# Patient Record
Sex: Male | Born: 1975 | Race: White | Hispanic: No | Marital: Married | Smoking: Former smoker
Health system: Southern US, Community
[De-identification: ages and names within clinical notes are randomized; demographics above are authoritative.]

## PROBLEM LIST (undated history)

## (undated) DIAGNOSIS — Z720 Tobacco use: Secondary | ICD-10-CM

## (undated) DIAGNOSIS — R319 Hematuria, unspecified: Secondary | ICD-10-CM

## (undated) DIAGNOSIS — K219 Gastro-esophageal reflux disease without esophagitis: Secondary | ICD-10-CM

## (undated) DIAGNOSIS — Z8619 Personal history of other infectious and parasitic diseases: Secondary | ICD-10-CM

## (undated) DIAGNOSIS — G4730 Sleep apnea, unspecified: Secondary | ICD-10-CM

## (undated) DIAGNOSIS — T8859XA Other complications of anesthesia, initial encounter: Secondary | ICD-10-CM

## (undated) DIAGNOSIS — Z87442 Personal history of urinary calculi: Secondary | ICD-10-CM

## (undated) DIAGNOSIS — Z8719 Personal history of other diseases of the digestive system: Secondary | ICD-10-CM

## (undated) DIAGNOSIS — N321 Vesicointestinal fistula: Secondary | ICD-10-CM

## (undated) DIAGNOSIS — K5792 Diverticulitis of intestine, part unspecified, without perforation or abscess without bleeding: Secondary | ICD-10-CM

## (undated) DIAGNOSIS — F410 Panic disorder [episodic paroxysmal anxiety] without agoraphobia: Secondary | ICD-10-CM

## (undated) DIAGNOSIS — E669 Obesity, unspecified: Secondary | ICD-10-CM

## (undated) DIAGNOSIS — J45909 Unspecified asthma, uncomplicated: Secondary | ICD-10-CM

## (undated) DIAGNOSIS — G4733 Obstructive sleep apnea (adult) (pediatric): Secondary | ICD-10-CM

## (undated) HISTORY — DX: Panic disorder (episodic paroxysmal anxiety): F41.0

## (undated) HISTORY — PX: BACK SURGERY: SHX140

## (undated) HISTORY — PX: WISDOM TOOTH EXTRACTION: SHX21

## (undated) HISTORY — DX: Personal history of other infectious and parasitic diseases: Z86.19

---

## 2005-11-07 ENCOUNTER — Emergency Department: Payer: Self-pay | Admitting: Emergency Medicine

## 2006-02-16 DIAGNOSIS — J45909 Unspecified asthma, uncomplicated: Secondary | ICD-10-CM | POA: Insufficient documentation

## 2006-02-22 ENCOUNTER — Emergency Department: Payer: Self-pay | Admitting: Emergency Medicine

## 2006-03-17 DIAGNOSIS — Z720 Tobacco use: Secondary | ICD-10-CM | POA: Insufficient documentation

## 2008-03-01 DIAGNOSIS — G44209 Tension-type headache, unspecified, not intractable: Secondary | ICD-10-CM | POA: Insufficient documentation

## 2008-06-13 ENCOUNTER — Ambulatory Visit: Payer: Self-pay | Admitting: Internal Medicine

## 2008-07-24 ENCOUNTER — Ambulatory Visit: Payer: Self-pay | Admitting: Family Medicine

## 2008-07-29 DIAGNOSIS — M940 Chondrocostal junction syndrome [Tietze]: Secondary | ICD-10-CM | POA: Insufficient documentation

## 2008-08-15 ENCOUNTER — Ambulatory Visit: Payer: Self-pay | Admitting: Family Medicine

## 2008-11-13 ENCOUNTER — Ambulatory Visit: Payer: Self-pay | Admitting: Family Medicine

## 2008-11-24 IMAGING — CR DG CHEST 2V
1 series · 2 of 2 positions shown · non-contrast
Comparison: none

REASON FOR EXAM: chest/sternum pain
COMMENTS:

[Series 1: view not recorded · 0.17mm/px · 2 of 2 slices shown]
[im 1/2]
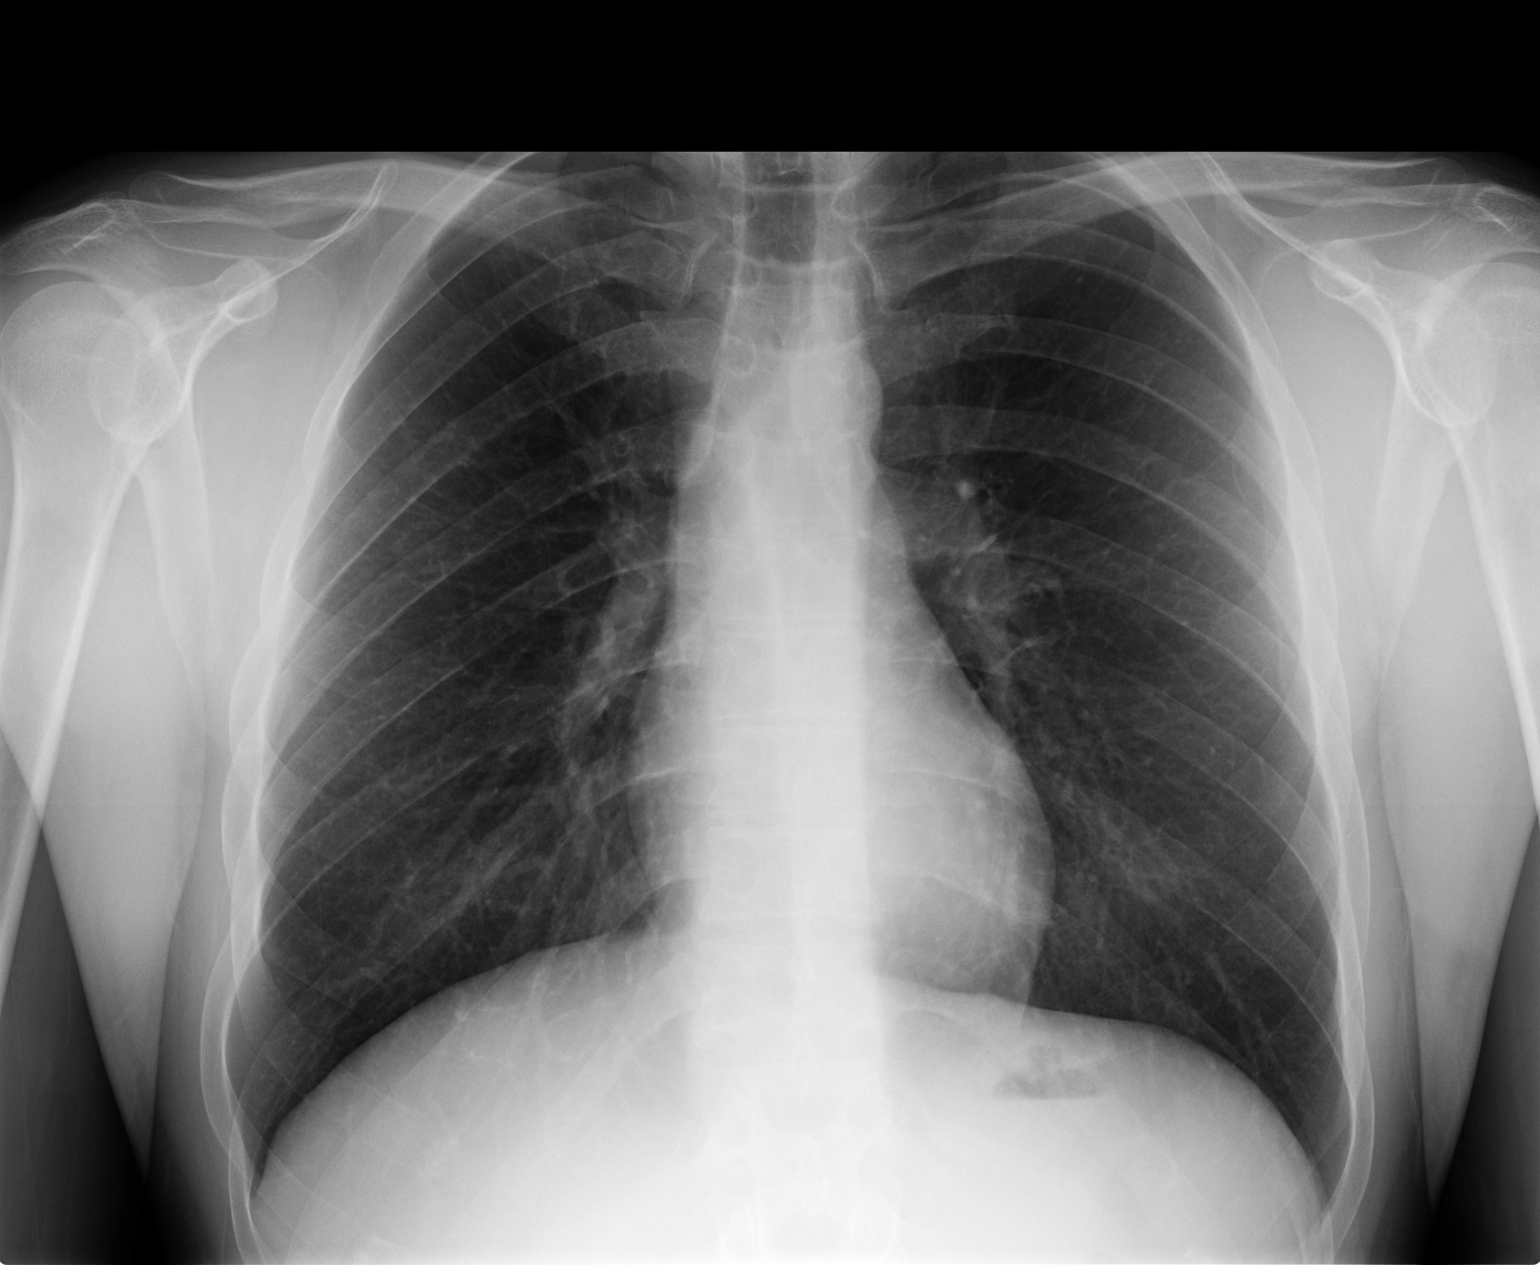
[im 2/2]
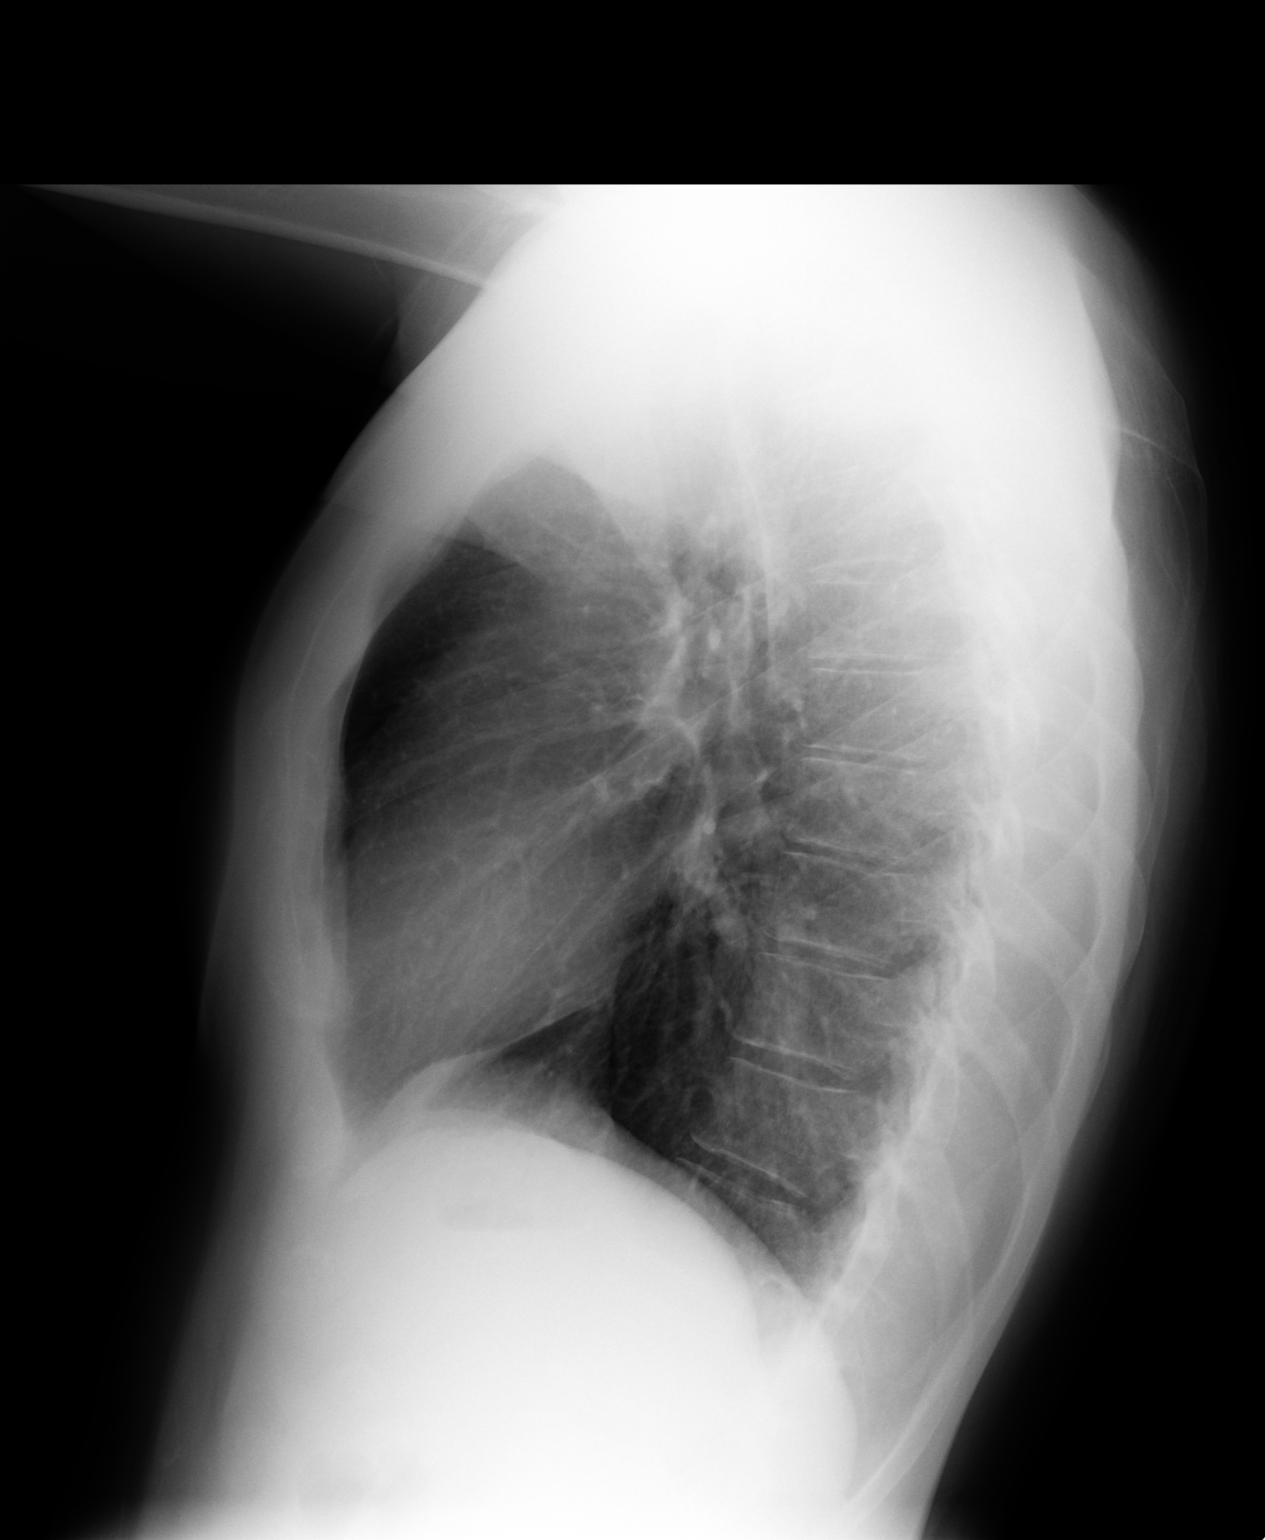

[2 of 2 positions shown; findings below may reference images not displayed]

PROCEDURE:     KDR - KDXR CHEST PA (OR AP) AND LAT  - July 24, 2008 [DATE]

RESULT:     Comparison is made to previous examination of 11/07/2005.

The lungs are clear. The heart and pulmonary vessels are normal. The bony
and mediastinal structures are unremarkable. There is no effusion. There is
no pneumothorax or evidence of congestive failure.
IMPRESSION: No acute cardiopulmonary disease.

## 2010-10-28 ENCOUNTER — Ambulatory Visit: Payer: Self-pay | Admitting: Internal Medicine

## 2011-10-05 ENCOUNTER — Emergency Department: Payer: Self-pay | Admitting: Emergency Medicine

## 2013-01-01 ENCOUNTER — Emergency Department: Payer: Self-pay | Admitting: Unknown Physician Specialty

## 2013-01-01 LAB — BASIC METABOLIC PANEL
Anion Gap: 5 — ABNORMAL LOW (ref 7–16)
BUN: 15 mg/dL (ref 7–18)
EGFR (African American): >60
EGFR (Non-African Amer.): >60
Glucose: 95 mg/dL (ref 65–99)
Osmolality: 282 (ref 275–301)
Sodium: 141 mmol/L (ref 136–145)

## 2013-01-01 LAB — CBC
HCT: 44.8 % (ref 40.0–52.0)
HGB: 15 g/dL (ref 13.0–18.0)
MCH: 28.2 pg (ref 26.0–34.0)
MCHC: 33.4 g/dL (ref 32.0–36.0)
MCV: 84 fL (ref 80–100)
WBC: 9.3 10*3/uL (ref 3.8–10.6)

## 2013-01-01 LAB — TROPONIN I
Troponin-I: <0.02 ng/mL
Troponin-I: <0.02 ng/mL

## 2013-06-21 ENCOUNTER — Encounter: Payer: Self-pay | Admitting: Family Medicine

## 2013-06-21 LAB — HM COLONOSCOPY: HM Colonoscopy: NORMAL

## 2013-06-27 ENCOUNTER — Ambulatory Visit: Payer: Self-pay | Admitting: Unknown Physician Specialty

## 2013-12-12 LAB — BASIC METABOLIC PANEL
BUN: 15 mg/dL (ref 4–21)
Creatinine: 1 mg/dL (ref <=1.3)
GLUCOSE: 77 mg/dL
Potassium: 4.2 mmol/L (ref 3.4–5.3)
SODIUM: 140 mmol/L (ref 137–147)

## 2013-12-12 LAB — CBC AND DIFFERENTIAL
HEMATOCRIT: 46 % (ref 41–53)
Hemoglobin: 15.9 g/dL (ref 13.5–17.5)
Platelets: 253 10*3/uL (ref 150–399)
WBC: 9.7 10^3/mL

## 2013-12-12 LAB — TSH: TSH: 1.74 u[IU]/mL (ref <=5.90)

## 2015-01-22 ENCOUNTER — Emergency Department: Admit: 2015-01-22 | Disposition: A | Discharge status: Home / Self Care | Payer: Self-pay | Admitting: Student

## 2015-01-22 LAB — TROPONIN I

## 2015-01-22 LAB — COMPREHENSIVE METABOLIC PANEL WITH GFR
Albumin: 4.7 g/dL
Alkaline Phosphatase: 84 U/L
Anion Gap: 9
BUN: 16 mg/dL
Bilirubin,Total: 0.4 mg/dL
Calcium, Total: 9.9 mg/dL
Chloride: 103 mmol/L
Co2: 25 mmol/L
Creatinine: 1.02 mg/dL
EGFR (African American): >60
EGFR (Non-African Amer.): >60
Glucose: 103 mg/dL — ABNORMAL HIGH
Potassium: 3.6 mmol/L
SGOT(AST): 20 U/L
SGPT (ALT): 17 U/L
Sodium: 137 mmol/L
Total Protein: 7.5 g/dL

## 2015-01-22 LAB — CBC
HCT: 44.1 %
HGB: 15.2 g/dL
MCH: 28.7 pg
MCHC: 34.5 g/dL
MCV: 83 fL
Platelet: 261 x10 3/mm 3
RBC: 5.3 x10 6/mm 3
RDW: 14.1 %
WBC: 9.6 x10 3/mm 3

## 2015-01-22 LAB — CK TOTAL AND CKMB (NOT AT ARMC)
CK, Total: 97 U/L
CK-MB: 1.4 ng/mL

## 2015-01-23 LAB — LIPID PANEL
Cholesterol: 185 mg/dL (ref 0–200)
HDL: 50 mg/dL (ref 35–70)
LDL CALC: 117 mg/dL
TRIGLYCERIDES: 92 mg/dL (ref 40–160)

## 2015-10-19 ENCOUNTER — Encounter: Payer: Self-pay | Admitting: Emergency Medicine

## 2015-10-19 ENCOUNTER — Emergency Department
Admission: EM | Admit: 2015-10-19 | Discharge: 2015-10-19 | Disposition: A | Discharge status: Home / Self Care | Payer: BLUE CROSS/BLUE SHIELD | Attending: Emergency Medicine | Admitting: Emergency Medicine

## 2015-10-19 ENCOUNTER — Emergency Department: Payer: BLUE CROSS/BLUE SHIELD

## 2015-10-19 DIAGNOSIS — R0789 Other chest pain: Secondary | ICD-10-CM | POA: Insufficient documentation

## 2015-10-19 DIAGNOSIS — Z87891 Personal history of nicotine dependence: Secondary | ICD-10-CM | POA: Insufficient documentation

## 2015-10-19 DIAGNOSIS — J019 Acute sinusitis, unspecified: Secondary | ICD-10-CM | POA: Diagnosis not present

## 2015-10-19 DIAGNOSIS — R51 Headache: Secondary | ICD-10-CM | POA: Insufficient documentation

## 2015-10-19 DIAGNOSIS — R079 Chest pain, unspecified: Secondary | ICD-10-CM | POA: Diagnosis present

## 2015-10-19 DIAGNOSIS — Z880 Allergy status to penicillin: Secondary | ICD-10-CM | POA: Diagnosis not present

## 2015-10-19 HISTORY — DX: Gastro-esophageal reflux disease without esophagitis: K21.9

## 2015-10-19 LAB — COMPREHENSIVE METABOLIC PANEL
ALT: 17 U/L (ref 17–63)
AST: 14 U/L — AB (ref 15–41)
Albumin: 3.8 g/dL (ref 3.5–5.0)
Alkaline Phosphatase: 81 U/L (ref 38–126)
Anion gap: 6 (ref 5–15)
BILIRUBIN TOTAL: 0.6 mg/dL (ref 0.3–1.2)
BUN: 20 mg/dL (ref 6–20)
CO2: 26 mmol/L (ref 22–32)
Calcium: 8.9 mg/dL (ref 8.9–10.3)
Chloride: 106 mmol/L (ref 101–111)
Creatinine, Ser: 0.94 mg/dL (ref 0.61–1.24)
GFR calc Af Amer: >60 mL/min (ref >60)
Glucose, Bld: 90 mg/dL (ref 65–99)
POTASSIUM: 3.8 mmol/L (ref 3.5–5.1)
Sodium: 138 mmol/L (ref 135–145)
Total Protein: 7.2 g/dL (ref 6.5–8.1)

## 2015-10-19 LAB — CBC
HEMATOCRIT: 45.6 % (ref 40.0–52.0)
Hemoglobin: 15.6 g/dL (ref 13.0–18.0)
MCH: 27.7 pg (ref 26.0–34.0)
MCHC: 34.1 g/dL (ref 32.0–36.0)
MCV: 81.4 fL (ref 80.0–100.0)
Platelets: 398 10*3/uL (ref 150–440)
RBC: 5.61 MIL/uL (ref 4.40–5.90)
RDW: 13.4 % (ref 11.5–14.5)
WBC: 12 10*3/uL — AB (ref 3.8–10.6)

## 2015-10-19 LAB — TROPONIN I

## 2015-10-19 MED ORDER — PANTOPRAZOLE SODIUM 20 MG PO TBEC: 20.0000 mg | DELAYED_RELEASE_TABLET | Freq: Every day | ORAL | Status: DC

## 2015-10-19 MED ORDER — KETOROLAC TROMETHAMINE 30 MG/ML IJ SOLN
INTRAMUSCULAR | Status: AC
  Administered 2015-10-19: 30 mg via INTRAVENOUS
  Filled 2015-10-19: qty 1

## 2015-10-19 MED ORDER — KETOROLAC TROMETHAMINE 30 MG/ML IJ SOLN
30.0000 mg | Freq: Once | INTRAMUSCULAR | Status: AC
  Administered 2015-10-19: 30 mg via INTRAVENOUS

## 2015-10-19 MED ORDER — GI COCKTAIL ~~LOC~~
30.0000 mL | Freq: Once | ORAL | Status: AC
  Administered 2015-10-19: 30 mL via ORAL
  Filled 2015-10-19: qty 30

## 2015-10-19 NOTE — ED Notes (Addendum)
Patient states that he started having chest pain last night. This morning pain has not improved and is radiating down his left arm. Patient states that he had some shortness of breath last PM when going up the steps but denies any other symptoms. Patient has a hx/o reflux and also anxiety.  Patient also states that he is having an achy pain in his fingers on his left hand and a headache

## 2015-10-19 NOTE — ED Notes (Signed)
Intermittent chest pain since 9 pm last night, states began while resting on couch watching TV.

## 2015-10-19 NOTE — Discharge Instructions (Signed)
Nonspecific Chest Pain  °Chest pain can be caused by many different conditions. There is always a chance that your pain could be related to something serious, such as a heart attack or a blood clot in your lungs. Chest pain can also be caused by conditions that are not life-threatening. If you have chest pain, it is very important to follow up with your health care provider. °CAUSES  °Chest pain can be caused by: °· Heartburn. °· Pneumonia or bronchitis. °· Anxiety or stress. °· Inflammation around your heart (pericarditis) or lung (pleuritis or pleurisy). °· A blood clot in your lung. °· A collapsed lung (pneumothorax). It can develop suddenly on its own (spontaneous pneumothorax) or from trauma to the chest. °· Shingles infection (varicella-zoster virus). °· Heart attack. °· Damage to the bones, muscles, and cartilage that make up your chest wall. This can include: °¨ Bruised bones due to injury. °¨ Strained muscles or cartilage due to frequent or repeated coughing or overwork. °¨ Fracture to one or more ribs. °¨ Sore cartilage due to inflammation (costochondritis). °RISK FACTORS  °Risk factors for chest pain may include: °· Activities that increase your risk for trauma or injury to your chest. °· Respiratory infections or conditions that cause frequent coughing. °· Medical conditions or overeating that can cause heartburn. °· Heart disease or family history of heart disease. °· Conditions or health behaviors that increase your risk of developing a blood clot. °· Having had chicken pox (varicella zoster). °SIGNS AND SYMPTOMS °Chest pain can feel like: °· Burning or tingling on the surface of your chest or deep in your chest. °· Crushing, pressure, aching, or squeezing pain. °· Dull or sharp pain that is worse when you move, cough, or take a deep breath. °· Pain that is also felt in your back, neck, shoulder, or arm, or pain that spreads to any of these areas. °Your chest pain may come and go, or it may stay  constant. °DIAGNOSIS °Lab tests or other studies may be needed to find the cause of your pain. Your health care provider may have you take a test called an ambulatory ECG (electrocardiogram). An ECG records your heartbeat patterns at the time the test is performed. You may also have other tests, such as: °· Transthoracic echocardiogram (TTE). During echocardiography, sound waves are used to create a picture of all of the heart structures and to look at how blood flows through your heart. °· Transesophageal echocardiogram (TEE). This is a more advanced imaging test that obtains images from inside your body. It allows your health care provider to see your heart in finer detail. °· Cardiac monitoring. This allows your health care provider to monitor your heart rate and rhythm in real time. °· Holter monitor. This is a portable device that records your heartbeat and can help to diagnose abnormal heartbeats. It allows your health care provider to track your heart activity for several days, if needed. °· Stress tests. These can be done through exercise or by taking medicine that makes your heart beat more quickly. °· Blood tests. °· Imaging tests. °TREATMENT  °Your treatment depends on what is causing your chest pain. Treatment may include: °· Medicines. These may include: °¨ Acid blockers for heartburn. °¨ Anti-inflammatory medicine. °¨ Pain medicine for inflammatory conditions. °¨ Antibiotic medicine, if an infection is present. °¨ Medicines to dissolve blood clots. °¨ Medicines to treat coronary artery disease. °· Supportive care for conditions that do not require medicines. This may include: °¨ Resting. °¨ Applying heat   or cold packs to injured areas.  Limiting activities until pain decreases. HOME CARE INSTRUCTIONS  If you were prescribed an antibiotic medicine, finish it all even if you start to feel better.  Avoid any activities that bring on chest pain.  Do not use any tobacco products, including  cigarettes, chewing tobacco, or electronic cigarettes. If you need help quitting, ask your health care provider.  Do not drink alcohol.  Take medicines only as directed by your health care provider.  Keep all follow-up visits as directed by your health care provider. This is important. This includes any further testing if your chest pain does not go away.  If heartburn is the cause for your chest pain, you may be told to keep your head raised (elevated) while sleeping. This reduces the chance that acid will go from your stomach into your esophagus.  Make lifestyle changes as directed by your health care provider. These may include:  Getting regular exercise. Ask your health care provider to suggest some activities that are safe for you.  Eating a heart-healthy diet. A registered dietitian can help you to learn healthy eating options.  Maintaining a healthy weight.  Managing diabetes, if necessary.  Reducing stress. SEEK MEDICAL CARE IF:  Your chest pain does not go away after treatment.  You have a rash with blisters on your chest.  You have a fever. SEEK IMMEDIATE MEDICAL CARE IF:   Your chest pain is worse.  You have an increasing cough, or you cough up blood.  You have severe abdominal pain.  You have severe weakness.  You faint.  You have chills.  You have sudden, unexplained chest discomfort.  You have sudden, unexplained discomfort in your arms, back, neck, or jaw.  You have shortness of breath at any time.  You suddenly start to sweat, or your skin gets clammy.  You feel nauseous or you vomit.  You suddenly feel light-headed or dizzy.  Your heart begins to beat quickly, or it feels like it is skipping beats. These symptoms may represent a serious problem that is an emergency. Do not wait to see if the symptoms will go away. Get medical help right away. Call your local emergency services (911 in the [place] Do not drive yourself to the hospital.   This  information is not intended to replace advice given to you by your health care provider. Make sure you discuss any questions you have with your health care provider.   Document Released: 07/15/2005 Document Revised: 10/26/2014 Document Reviewed: 05/11/2014 Elsevier Interactive Patient Education [employer]2016 Elsevier Inc.  Please return immediately if condition worsens. Please contact her primary physician or the physician you were given for referral. If you have any specialist physicians involved in her treatment and plan please also contact them. Thank you for using [facility]. He can take over-the-counter Prilosec or Nexium. Avoid nonsteroidal products such as ibuprofen and Aleve. Tylenol for pain

## 2015-10-19 NOTE — ED Provider Notes (Signed)
Time Seen: Approximately *10 AM  I have reviewed the triage notes  Chief Complaint: Chest Pain   History of Present Illness: Derek Ramirez is a 39 y.o. male who presents with left-sided chest discomfort which he noticed last night at 9 PM. He describes it as a burning sensation left sternal border with some paresthesias and possible radiation into the left arm. He states he went to bed and noticed it again this morning he is not sure if it lasted all night. He denies any nausea, vomiting, diaphoresis, shortness of breath. He denies any pulmonary emboli risk factors. He denies any significant family history, hypertension, diabetes or high cholesterol. He also states he's had a mild left-sided headache and points mainly to the frontal and left frontal region without any sinus discharge, fever, productive cough or wheezing. He denies any neck pain, photophobia focal weakness.*   Past Medical History  Diagnosis Date  . Anxiety   . GERD (gastroesophageal reflux disease)   . IBS (irritable bowel syndrome)     There are no active problems to display for this patient.   History reviewed. No pertinent past surgical history.  History reviewed. No pertinent past surgical history.  Current Outpatient Rx  Name  Route  Sig  Dispense  Refill  . pantoprazole (PROTONIX) 20 MG tablet   Oral   Take 1 tablet (20 mg total) by mouth daily.   30 tablet   1     Allergies:  Penicillins  Family History: No family history on file.  Social History: Social History  Substance Use Topics  . Smoking status: Former Games developer  . Smokeless tobacco: None  . Alcohol Use: No     Review of Systems:   10 point review of systems was performed and was otherwise negative:  Constitutional: No fever Eyes: No visual disturbances ENT: No sore throat, ear pain Cardiac: Chest pain is relatively persistent since early this morning and/or last night. No radiation to the jaw or back region Respiratory: No  shortness of breath, wheezing, or stridor Abdomen: No abdominal pain, no vomiting, No diarrhea Endocrine: No weight loss, No night sweats Extremities: No peripheral edema, cyanosis Skin: No rashes, easy bruising Neurologic: No focal weakness, trouble with speech or swollowing Urologic: No dysuria, Hematuria, or urinary frequency   Physical Exam:  ED Triage Vitals  Enc Vitals Group     BP 10/19/15 0933 123/84 mmHg     Pulse Rate 10/19/15 0933 79     Resp 10/19/15 0933 20     Temp 10/19/15 0933 98.5 F (36.9 C)     Temp Source 10/19/15 0933 Oral     SpO2 10/19/15 0933 99 %     Weight 10/19/15 0933 210 lb (95.255 kg)     Height 10/19/15 0933  (1.803 m)     Head Cir --      Peak Flow --      Pain Score 10/19/15 0934 4     Pain Loc --      Pain Edu? --      Excl. in GC? --     General: Awake , Alert , and Oriented times 3; GCS 15 Head: Normal cephalic , atraumatic Eyes: Pupils equal , round, reactive to light Nose/Throat: No nasal drainage, patent upper airway without erythema or exudate.  Neck: Supple, Full range of motion, No anterior adenopathy or palpable thyroid masses Lungs: Clear to ascultation without wheezes , rhonchi, or rales Heart: Regular rate, regular rhythm without murmurs ,  gallops , or rubs Abdomen: Soft, non tender without rebound, guarding , or rigidity; bowel sounds positive and symmetric in all 4 quadrants. No organomegaly .        Extremities: 2 plus symmetric pulses. No edema, clubbing or cyanosis Neurologic: normal ambulation, Motor symmetric without deficits, sensory intact Skin: warm, dry, no rashes   Labs:   All laboratory work was reviewed including any pertinent negatives or positives listed below:  Labs Reviewed  CBC - Abnormal; Notable for the following:    WBC 12.0 (*)    All other components within normal limits  COMPREHENSIVE METABOLIC PANEL - Abnormal; Notable for the following:    AST 14 (*)    All other components within normal  limits  TROPONIN I   review of laboratory work shows no significant abnormalities  EKG:  ED ECG REPORT I, Jennye MoccasinBrian S Samari Bittinger, the attending physician, personally viewed and interpreted this ECG.  Date: 10/19/2015 EKG Time: 0930 Rate: 73 Rhythm: normal sinus rhythm QRS Axis: normal Intervals: normal ST/T Wave abnormalities: normal Conduction Disutrbances: none Narrative Interpretation: unremarkable Normal EKG EXAM: CT HEAD WITHOUT CONTRAST  TECHNIQUE: Contiguous axial images were obtained from the base of the skull through the vertex without intravenous contrast.  COMPARISON: None.  FINDINGS: There is no evidence for acute hemorrhage, hydrocephalus, mass lesion, or abnormal extra-axial fluid collection. No definite CT evidence for acute infarction. Near complete opacification of the maxillary sinuses and sphenoid sinuses evident with air-fluid levels visible in the frontal and sphenoid sinuses. There is prominent mucosal disease in the ethmoid air cells. Mastoid air cells are clear.  IMPRESSION: 1. No acute intracranial abnormality. 2. Prominent acute on chronic paranasal sinusitis.   Electronically Signed By: Kennith CenterEric Mansell M.D. On: 10/19/2015 10:33          DG Chest 2 View (Final result) Result time: 10/19/15 10:30:48   Final result by Rad Results In Interface (10/19/15 10:30:48)   Narrative:   CLINICAL DATA: Chest the last night  EXAM: CHEST 2 VIEW  COMPARISON: 01/22/2015  FINDINGS: Normal heart size. Clear lungs. No pneumothorax or pleural effusion.  IMPRESSION: No active cardiopulmonary disease.       Radiology:       I personally reviewed the radiologic studies     ED Course: * Patient's stay here was uneventful and felt symptomatically improved with a GI cocktail. He has a history of esophageal reflux disease and is currently not on any anti-acid medication. Patient will be prescribed proton next on an outpatient basis and  I felt this was unlikely to be a life-threatening cause for his chest pain. Patient's headache seems to be a new issue for him he states he's never had a chronic headache status not state that this is a severe discomfort that he is having. Given a negative head CT I felt he did not require lumbar puncture evaluation and him that he may need further outpatient testing. Patient does not exhibit any fever and I did not feel the sinusitis was acute bacterial at this point. He has been advised to follow-up with his primary physician.**    Assessment: Acute chest pain likely esophageal reflux disease Acute cephalgia with acute on chronic sinusitis  Final Clinical Impression:   Final diagnoses:  Chest pain syndrome     Plan: Outpatient management Patient was advised to return immediately if condition worsens. Patient was advised to follow up with their primary care physician or other specialized physicians involved in their outpatient care  Jennye Moccasin, MD 10/19/15 [phone]

## 2015-10-30 ENCOUNTER — Telehealth: Payer: Self-pay | Admitting: Family Medicine

## 2015-10-30 NOTE — Telephone Encounter (Signed)
[facility] for stomach pain.  I have scheduled a hospital follow up/MW

## 2015-10-31 DIAGNOSIS — R079 Chest pain, unspecified: Secondary | ICD-10-CM | POA: Insufficient documentation

## 2015-10-31 DIAGNOSIS — R12 Heartburn: Secondary | ICD-10-CM | POA: Insufficient documentation

## 2015-10-31 DIAGNOSIS — J309 Allergic rhinitis, unspecified: Secondary | ICD-10-CM | POA: Insufficient documentation

## 2015-10-31 DIAGNOSIS — F410 Panic disorder [episodic paroxysmal anxiety] without agoraphobia: Secondary | ICD-10-CM | POA: Insufficient documentation

## 2015-10-31 DIAGNOSIS — F419 Anxiety disorder, unspecified: Secondary | ICD-10-CM | POA: Insufficient documentation

## 2015-10-31 DIAGNOSIS — R002 Palpitations: Secondary | ICD-10-CM | POA: Insufficient documentation

## 2015-10-31 DIAGNOSIS — K219 Gastro-esophageal reflux disease without esophagitis: Secondary | ICD-10-CM | POA: Insufficient documentation

## 2015-11-01 ENCOUNTER — Encounter: Payer: Self-pay | Admitting: Family Medicine

## 2015-11-01 ENCOUNTER — Ambulatory Visit (INDEPENDENT_AMBULATORY_CARE_PROVIDER_SITE_OTHER): Payer: BLUE CROSS/BLUE SHIELD | Admitting: Family Medicine

## 2015-11-01 VITALS — BP 104/72 | HR 72 | Temp 99.2°F | Resp 12 | Wt 210.0 lb

## 2015-11-01 DIAGNOSIS — F419 Anxiety disorder, unspecified: Secondary | ICD-10-CM

## 2015-11-01 DIAGNOSIS — K219 Gastro-esophageal reflux disease without esophagitis: Secondary | ICD-10-CM | POA: Diagnosis not present

## 2015-11-01 MED ORDER — PANTOPRAZOLE SODIUM 20 MG PO TBEC: 20.0000 mg | DELAYED_RELEASE_TABLET | Freq: Every day | ORAL | Status: DC

## 2015-11-01 MED ORDER — SERTRALINE HCL 50 MG PO TABS: 25.0000 mg | ORAL_TABLET | Freq: Every day | ORAL | Status: DC

## 2015-11-01 MED ORDER — ALPRAZOLAM 0.25 MG PO TABS: 0.2500 mg | ORAL_TABLET | Freq: Four times a day (QID) | ORAL | Status: DC | PRN

## 2015-11-01 NOTE — Patient Instructions (Signed)
Gastroesophageal Reflux Disease, Adult Normally, food travels down the esophagus and stays in the stomach to be digested. However, when a person has gastroesophageal reflux disease (GERD), food and stomach acid move back up into the esophagus. When this happens, the esophagus becomes sore and inflamed. Over time, GERD can create small holes (ulcers) in the lining of the esophagus.  CAUSES This condition is caused by a problem with the muscle between the esophagus and the stomach (lower esophageal sphincter, or LES). Normally, the LES muscle closes after food passes through the esophagus to the stomach. When the LES is weakened or abnormal, it does not close properly, and that allows food and stomach acid to go back up into the esophagus. The LES can be weakened by certain dietary substances, medicines, and medical conditions, including:  Tobacco use.  Pregnancy.  Having a hiatal hernia.  Heavy alcohol use.  Certain foods and beverages, such as coffee, chocolate, onions, and peppermint. RISK FACTORS This condition is more likely to develop in:  People who have an increased body weight.  People who have connective tissue disorders.  People who use NSAID medicines. SYMPTOMS Symptoms of this condition include:  Heartburn.  Difficult or painful swallowing.  The feeling of having a lump in the throat.  Abitter taste in the mouth.  Bad breath.  Having a large amount of saliva.  Having an upset or bloated stomach.  Belching.  Chest pain.  Shortness of breath or wheezing.  Ongoing (chronic) cough or a night-time cough.  Wearing away of tooth enamel.  Weight loss. Different conditions can cause chest pain. Make sure to see your health care provider if you experience chest pain. DIAGNOSIS Your health care provider will take a medical history and perform a physical exam. To determine if you have mild or severe GERD, your health care provider may also monitor how you respond  to treatment. You may also have other tests, including:  An endoscopy toexamine your stomach and esophagus with a small camera.  A test thatmeasures the acidity level in your esophagus.  A test thatmeasures how much pressure is on your esophagus.  A barium swallow or modified barium swallow to show the shape, size, and functioning of your esophagus. TREATMENT The goal of treatment is to help relieve your symptoms and to prevent complications. Treatment for this condition may vary depending on how severe your symptoms are. Your health care provider may recommend:  Changes to your diet.  Medicine.  Surgery. HOME CARE INSTRUCTIONS Diet  Follow a diet as recommended by your health care provider. This may involve avoiding foods and drinks such as:  Coffee and tea (with or without caffeine).  Drinks that containalcohol.  Energy drinks and sports drinks.  Carbonated drinks or sodas.  Chocolate and cocoa.  Peppermint and mint flavorings.  Garlic and onions.  Horseradish.  Spicy and acidic foods, including peppers, chili powder, curry powder, vinegar, hot sauces, and barbecue sauce.  Citrus fruit juices and citrus fruits, such as oranges, lemons, and limes.  Tomato-based foods, such as red sauce, chili, salsa, and pizza with red sauce.  Fried and fatty foods, such as donuts, french fries, potato chips, and high-fat dressings.  High-fat meats, such as hot dogs and fatty cuts of red and white meats, such as rib eye steak, sausage, ham, and bacon.  High-fat dairy items, such as whole milk, butter, and cream cheese.  Eat small, frequent meals instead of large meals.  Avoid drinking large amounts of liquid with your   meals.  Avoid eating meals during the 2-3 hours before bedtime.  Avoid lying down right after you eat.  Do not exercise right after you eat. General Instructions  Pay attention to any changes in your symptoms.  Take over-the-counter and prescription  medicines only as told by your health care provider. Do not take aspirin, ibuprofen, or other NSAIDs unless your health care provider told you to do so.  Do not use any tobacco products, including cigarettes, chewing tobacco, and e-cigarettes. If you need help quitting, ask your health care provider.  Wear loose-fitting clothing. Do not wear anything tight around your waist that causes pressure on your abdomen.  Raise (elevate) the head of your bed 6 inches (15cm).  Try to reduce your stress, such as with yoga or meditation. If you need help reducing stress, ask your health care provider.  If you are overweight, reduce your weight to an amount that is healthy for you. Ask your health care provider for guidance about a safe weight loss goal.  Keep all follow-up visits as told by your health care provider. This is important. SEEK MEDICAL CARE IF:  You have new symptoms.  You have unexplained weight loss.  You have difficulty swallowing, or it hurts to swallow.  You have wheezing or a persistent cough.  Your symptoms do not improve with treatment.  You have a hoarse voice. SEEK IMMEDIATE MEDICAL CARE IF:  You have pain in your arms, neck, jaw, teeth, or back.  You feel sweaty, dizzy, or light-headed.  You have chest pain or shortness of breath.  You vomit and your vomit looks like blood or coffee grounds.  You faint.  Your stool is bloody or black.  You cannot swallow, drink, or eat.   This information is not intended to replace advice given to you by your health care provider. Make sure you discuss any questions you have with your health care provider.   Document Released: 07/15/2005 Document Revised: 06/26/2015 Document Reviewed: 01/30/2015 Elsevier Interactive Patient Education 2016 Elsevier Inc. Food Choices for Gastroesophageal Reflux Disease, Adult When you have gastroesophageal reflux disease (GERD), the foods you eat and your eating habits are very important.  Choosing the right foods can help ease the discomfort of GERD. WHAT GENERAL GUIDELINES DO I NEED TO FOLLOW?  Choose fruits, vegetables, whole grains, low-fat dairy products, and low-fat meat, fish, and poultry.  Limit fats such as oils, salad dressings, butter, nuts, and avocado.  Keep a food diary to identify foods that cause symptoms.  Avoid foods that cause reflux. These may be different for different people.  Eat frequent small meals instead of three large meals each day.  Eat your meals slowly, in a relaxed setting.  Limit fried foods.  Cook foods using methods other than frying.  Avoid drinking alcohol.  Avoid drinking large amounts of liquids with your meals.  Avoid bending over or lying down until 2-3 hours after eating. WHAT FOODS ARE NOT RECOMMENDED? The following are some foods and drinks that may worsen your symptoms: Vegetables Tomatoes. Tomato juice. Tomato and spaghetti sauce. Chili peppers. Onion and garlic. Horseradish. Fruits Oranges, grapefruit, and lemon (fruit and juice). Meats High-fat meats, fish, and poultry. This includes hot dogs, ribs, ham, sausage, salami, and bacon. Dairy Whole milk and chocolate milk. Sour cream. Cream. Butter. Ice cream. Cream cheese.  Beverages Coffee and tea, with or without caffeine. Carbonated beverages or energy drinks. Condiments Hot sauce. Barbecue sauce.  Sweets/Desserts Chocolate and cocoa. Donuts. Peppermint and spearmint.   Fats and Oils High-fat foods, including French fries and potato chips. Other Vinegar. Strong spices, such as black pepper, white pepper, red pepper, cayenne, curry powder, cloves, ginger, and chili powder. The items listed above may not be a complete list of foods and beverages to avoid. Contact your dietitian for more information.   This information is not intended to replace advice given to you by your health care provider. Make sure you discuss any questions you have with your health care  provider.   Document Released: 10/05/2005 Document Revised: 10/26/2014 Document Reviewed: 08/09/2013 Elsevier Interactive Patient Education 2016 [employer] Inc.  

## 2015-11-01 NOTE — Progress Notes (Signed)
Patient ID: Derek Ramirez, male   DOB: Jan 05, 1976, 40 y.o.   MRN: [id number]        Patient: Derek Ramirez Male    DOB: 07-25-1976   40 y.o.   MRN: [id number] Visit Date: 11/01/2015  Today's Provider: Mila Merry, MD   Chief Complaint  Patient presents with  . Hospitalization Follow-up    ER follow up  . Gastroesophageal Reflux  . Headache   Subjective:    HPI  Follow up ER visit  Patient was seen in ER for chest pain and headache on December 31st. He was treated for GERD-he was started on Protonix 20 mg 1 tablet daily-he was given GI cocktail due to chest pain he was having when he arrived to ER and symptoms quickly resolved For evaluation Headache-they did CT of the head which was normal, this symptom was diagnosed as chronic sinusitis. He had sinus infection a few weeks before which he had treated symptomatically with OTC medications and antibiotic that was prescribed by a 'Tele-Doc' He reports good compliance with treatment. He reports this condition is Improved. He states he still gets a pain in the left upper quadrant of his abdomen randomly, states this is not always related with eating or not. His headache has resolved except just had one last night but was able to treat this with Tylenol.   Had similar symptoms a few years ago and had cardiac evaluation by Dr. Darrold Junker, who referred him to [facility] GI. He was unable to completed EGD but had barium swallow showing reflux  He states he typically has heartburn once or twice a week. Has recently made some dietary changes.   No alcohol or cigarettes.    Follow up anxiety He states that sertraline worked very well and was well tolerated, but he ran out a few months ago and would like refill. He states he took occasional alprazolam, but is now out     Allergies  Allergen Reactions  . Penicillins Anaphylaxis    Has patient had a PCN reaction causing immediate rash, facial/tongue/throat swelling, SOB or lightheadedness with  hypotension: Yes Has patient had a PCN reaction causing severe rash involving mucus membranes or skin necrosis: No Has patient had a PCN reaction that required hospitalization No Patient not sure Has patient had a PCN reaction occurring within the last 10 years: No If all of the above answers are "NO", then may proceed with Cephalosporin use.  . Shellfish Allergy     Throat swells and cant breath   Previous Medications   ALPRAZOLAM (XANAX) 0.25 MG TABLET    Take 1-2 tablets by mouth every 6 (six) hours as needed.   IBUPROFEN (ADVIL,MOTRIN) 600 MG TABLET    Take 1 tablet by mouth 4 (four) times daily as needed.   MULTIPLE VITAMINS-MINERALS (MULTIVITAMIN ADULT PO)    Take 1 tablet by mouth daily.   PANTOPRAZOLE (PROTONIX) 20 MG TABLET    Take 1 tablet (20 mg total) by mouth daily.   SERTRALINE (ZOLOFT) 50 MG TABLET    Take 0.5-1 tablets by mouth daily. Reported on 11/01/2015    Review of Systems  Constitutional: Negative.   HENT: Negative.   Respiratory: Negative.   Cardiovascular: Negative.   Gastrointestinal: Positive for abdominal pain.  Musculoskeletal: Negative.   Neurological: Negative.     Social History  Substance Use Topics  . Smoking status: Former Smoker -- 2.00 packs/day for 16 years    Types: Cigarettes    Quit date: 10/19/2010  .  Smokeless tobacco: Never Used  . Alcohol Use: No   Objective:   BP 104/72 mmHg  Pulse 72  Temp(Src) 99.2 F (37.3 C)  Resp 12  Wt 210 lb (95.255 kg)  Physical Exam  General appearance: alert, well developed, well nourished, cooperative and in no distress Head: Normocephalic, without obvious abnormality, atraumatic Lungs: Respirations even and unlabored Extremities: No gross deformities Skin: Skin color, texture, turgor normal. No rashes seen  Psych: Appropriate mood and affect. Neurologic: Mental status: Alert, oriented to person, place, and time, thought content appropriate.     Assessment & Plan:     1. Gastroesophageal  reflux disease without esophagitis Reviewed reflux precautions. Consider severity of symptoms of underlying regularity of symptoms will start back on daily PPI fo next 6 months. Consider cutting back to H2 blocker if he loses weight and is able to make significant lifestyle changes.  - pantoprazole (PROTONIX) 20 MG tablet; Take 1 tablet (20 mg total) by mouth daily.  Dispense: 30 tablet; Refill: 5  2. Anxiety Did well on sertraline and will restart this. May also take occasional alprazolam.  - sertraline (ZOLOFT) 50 MG tablet; Take 0.5-1 tablets (25-50 mg total) by mouth daily. Reported on 11/01/2015  Dispense: 30 tablet; Refill: 5 - ALPRAZolam (XANAX) 0.25 MG tablet; Take 1-2 tablets (0.25-0.5 mg total) by mouth every 6 (six) hours as needed.  Dispense: 30 tablet; Refill: 5       Mila Merryonald Fisher, MD  [facility]

## 2015-11-05 ENCOUNTER — Encounter: Payer: Self-pay | Admitting: Family Medicine

## 2016-01-28 ENCOUNTER — Ambulatory Visit (INDEPENDENT_AMBULATORY_CARE_PROVIDER_SITE_OTHER): Payer: Managed Care, Other (non HMO) | Admitting: Family Medicine

## 2016-01-28 ENCOUNTER — Encounter: Payer: Self-pay | Admitting: Family Medicine

## 2016-01-28 VITALS — BP 112/76 | HR 82 | Temp 98.8°F | Resp 16 | Wt 209.8 lb

## 2016-01-28 DIAGNOSIS — K648 Other hemorrhoids: Secondary | ICD-10-CM | POA: Diagnosis not present

## 2016-01-28 DIAGNOSIS — K589 Irritable bowel syndrome without diarrhea: Secondary | ICD-10-CM

## 2016-01-28 MED ORDER — HYDROCORTISONE ACETATE 25 MG RE SUPP: 25.0000 mg | Freq: Two times a day (BID) | RECTAL | Status: DC

## 2016-01-28 NOTE — Patient Instructions (Signed)
Hemorrhoids Hemorrhoids are swollen veins around the rectum or anus. There are two types of hemorrhoids:   Internal hemorrhoids. These occur in the veins just inside the rectum. They may poke through to the outside and become irritated and painful.  External hemorrhoids. These occur in the veins outside the anus and can be felt as a painful swelling or hard lump near the anus. CAUSES  Pregnancy.   Obesity.   Constipation or diarrhea.   Straining to have a bowel movement.   Sitting for long periods on the toilet.  Heavy lifting or other activity that caused you to strain.  Anal intercourse. SYMPTOMS   Pain.   Anal itching or irritation.   Rectal bleeding.   Fecal leakage.   Anal swelling.   One or more lumps around the anus.  DIAGNOSIS  Your caregiver may be able to diagnose hemorrhoids by visual examination. Other examinations or tests that may be performed include:   Examination of the rectal area with a gloved hand (digital rectal exam).   Examination of anal canal using a small tube (scope).   A blood test if you have lost a significant amount of blood.  A test to look inside the colon (sigmoidoscopy or colonoscopy). TREATMENT Most hemorrhoids can be treated at home. However, if symptoms do not seem to be getting better or if you have a lot of rectal bleeding, your caregiver may perform a procedure to help make the hemorrhoids get smaller or remove them completely. Possible treatments include:   Placing a rubber band at the base of the hemorrhoid to cut off the circulation (rubber band ligation).   Injecting a chemical to shrink the hemorrhoid (sclerotherapy).   Using a tool to burn the hemorrhoid (infrared light therapy).   Surgically removing the hemorrhoid (hemorrhoidectomy).   Stapling the hemorrhoid to block blood flow to the tissue (hemorrhoid stapling).  HOME CARE INSTRUCTIONS   Eat foods with fiber, such as whole grains, beans,  nuts, fruits, and vegetables. Ask your doctor about taking products with added fiber in them (fibersupplements).  Increase fluid intake. Drink enough water and fluids to keep your urine clear or pale yellow.   Exercise regularly.   Go to the bathroom when you have the urge to have a bowel movement. Do not wait.   Avoid straining to have bowel movements.   Keep the anal area dry and clean. Use wet toilet paper or moist towelettes after a bowel movement.   Medicated creams and suppositories may be used or applied as directed.   Only take over-the-counter or prescription medicines as directed by your caregiver.   Take warm sitz baths for 15-20 minutes, 3-4 times a day to ease pain and discomfort.   Place ice packs on the hemorrhoids if they are tender and swollen. Using ice packs between sitz baths may be helpful.   Put ice in a plastic bag.   Place a towel between your skin and the bag.   Leave the ice on for 15-20 minutes, 3-4 times a day.   Do not use a donut-shaped pillow or sit on the toilet for long periods. This increases blood pooling and pain.  SEEK MEDICAL CARE IF:  You have increasing pain and swelling that is not controlled by treatment or medicine.  You have uncontrolled bleeding.  You have difficulty or you are unable to have a bowel movement.  You have pain or inflammation outside the area of the hemorrhoids. MAKE SURE YOU:  Understand these instructions.    Will watch your condition.  Will get help right away if you are not doing well or get worse.   This information is not intended to replace advice given to you by your health care provider. Make sure you discuss any questions you have with your health care provider.   Document Released: 10/02/2000 Document Revised: 09/21/2012 Document Reviewed: 08/09/2012 Elsevier Interactive Patient Education 2016 Elsevier Inc. Irritable Bowel Syndrome, Adult Irritable bowel syndrome (IBS) is not one  specific disease. It is a group of symptoms that affects the organs responsible for digestion (gastrointestinal or GI tract).  To regulate how your GI tract works, your body sends signals back and forth between your intestines and your brain. If you have IBS, there may be a problem with these signals. As a result, your GI tract does not function normally. Your intestines may become more sensitive and overreact to certain things. This is especially true when you eat certain foods or when you are under stress.  There are four types of IBS. These may be determined based on the consistency of your stool:   IBS with diarrhea.   IBS with constipation.   Mixed IBS.   Unsubtyped IBS.  It is important to know which type of IBS you have. Some treatments are more likely to be helpful for certain types of IBS.  CAUSES  The exact cause of IBS is not known. RISK FACTORS You may have a higher risk of IBS if:  You are a woman.  You are younger than 38 years old.  You have a family history of IBS.  You have mental health problems.  You have had bacterial infection of your GI tract. SIGNS AND SYMPTOMS  Symptoms of IBS vary from person to person. The main symptom is abdominal pain or discomfort. Additional symptoms usually include one or more of the following:   Diarrhea, constipation, or both.   Abdominal swelling or bloating.   Feeling full or sick after eating a small or regular-size meal.   Frequent gas.   Mucus in the stool.   A feeling of having more stool left after a bowel movement.  Symptoms tend to come and go. They may be associated with stress, psychiatric conditions, or nothing at all.  DIAGNOSIS  There is no specific test to diagnose IBS. Your health care provider will make a diagnosis based on a physical exam, medical history, and your symptoms. You may have other tests to rule out other conditions that may be causing your symptoms. These may include:   Blood tests.    X-rays.   CT scan.  Endoscopy and colonoscopy. This is a test in which your GI tract is viewed with a long, thin, flexible tube. TREATMENT There is no cure for IBS, but treatment can help relieve symptoms. IBS treatment often includes:   Changes to your diet, such as:  Eating more fiber.  Avoiding foods that cause symptoms.  Drinking more water.  Eating regular, medium-sized portioned meals.  Medicines. These may include:  Fiber supplements if you have constipation.  Medicine to control diarrhea (antidiarrheal medicines).  Medicine to help control muscle spasms in your GI tract (antispasmodic medicines).  Medicines to help with any mental health issues, such as antidepressants or tranquilizers.  Therapy.  Talk therapy may help with anxiety, depression, or other mental health issues that can make IBS symptoms worse.  Stress reduction.  Managing your stress can help keep symptoms under control. HOME CARE INSTRUCTIONS   Take medicines only as  directed by your health care provider.  Eat a healthy diet.  Avoid foods and drinks with added sugar.  Include more whole grains, fruits, and vegetables gradually into your diet. This may be especially helpful if you have IBS with constipation.  Avoid any foods and drinks that make your symptoms worse. These may include dairy products and caffeinated or carbonated drinks.  Do not eat large meals.  Drink enough fluid to keep your urine clear or pale yellow.  Exercise regularly. Ask your health care provider for recommendations of good activities for you.  Keep all follow-up visits as directed by your health care provider. This is important. SEEK MEDICAL CARE IF:   You have constant pain.  You have trouble or pain with swallowing.  You have worsening diarrhea. SEEK IMMEDIATE MEDICAL CARE IF:   You have severe and worsening abdominal pain.   You have diarrhea and:   You have a rash, stiff neck, or severe  headache.   You are irritable, sleepy, or difficult to awaken.   You are weak, dizzy, or extremely thirsty.   You have bright red blood in your stool or you have black tarry stools.   You have unusual abdominal swelling that is painful.   You vomit continuously.   You vomit blood (hematemesis).   You have both abdominal pain and a fever.    This information is not intended to replace advice given to you by your health care provider. Make sure you discuss any questions you have with your health care provider.   Document Released: 10/05/2005 Document Revised: 10/26/2014 Document Reviewed: 06/22/2014 Elsevier Interactive Patient Education [employer]2016 Elsevier Inc.

## 2016-01-28 NOTE — Progress Notes (Signed)
Patient ID: Derek PacasKarl Mcelhannon, male   DOB: 03-10-1976, 40 y.o.   MRN: [id number]   Patient: Derek PacasKarl Peckinpaugh Male    DOB: 03-10-1976   40 y.o.   MRN: [id number] Visit Date: 01/28/2016  Today's Provider: Dortha Kernennis Chrismon, PA   Chief Complaint  Patient presents with  . Diarrhea   Subjective:    Diarrhea  This is a new problem. The current episode started today (Sunday). The problem occurs 2 to 4 times per day. The stool consistency is described as bloody and mucous. Associated symptoms include abdominal pain and headaches. Associated symptoms comments: Leg weakness and hot flashes. The symptoms are aggravated by stress. Treatments tried: Tums. The treatment provided no relief. His past medical history is significant for irritable bowel syndrome.    Past Medical History  Diagnosis Date  . History of chicken pox   . Panic disorder   . History of measles    Patient Active Problem List   Diagnosis Date Noted  . Allergic rhinitis 10/31/2015  . Anxiety 10/31/2015  . Chest pain 10/31/2015  . GERD (gastroesophageal reflux disease) 10/31/2015  . Heartburn 10/31/2015  . Palpitations 10/31/2015  . Panic disorder 10/31/2015  . 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 Headache, tension-type 03/01/2008  . Tobacco use 03/17/2006  . 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 No pertinent past surgical history. Family History  Problem Relation Age of Onset  . Diabetes Maternal Grandmother   . Diabetes Maternal Grandfather   . Lung cancer Paternal Grandmother     Due to Asbestos  . Lung cancer Paternal Grandfather     due to Asbestos  . Heart Problems Paternal Grandfather    Previous Medications   ALPRAZOLAM (XANAX) 0.25 MG TABLET    Take 1-2 tablets (0.25-0.5 mg total) by mouth every 6 (six) hours as needed.   IBUPROFEN (ADVIL,MOTRIN) 600 MG TABLET    Take 1 tablet by mouth 4 (four) times daily as needed.   MULTIPLE VITAMINS-MINERALS (MULTIVITAMIN ADULT PO)    Take 1 tablet by  mouth daily.   PANTOPRAZOLE (PROTONIX) 20 MG TABLET    Take 1 tablet (20 mg total) by mouth daily.   SERTRALINE (ZOLOFT) 50 MG TABLET    Take 0.5-1 tablets (25-50 mg total) by mouth daily. Reported on 11/01/2015   Allergies  Allergen Reactions  . Penicillins Anaphylaxis    Has patient had a PCN reaction causing immediate rash, facial/tongue/throat swelling, SOB or lightheadedness with hypotension: Yes Has patient had a PCN reaction causing severe rash involving mucus membranes or skin necrosis: No Has patient had a PCN reaction that required hospitalization No Patient not sure Has patient had a PCN reaction occurring within the last 10 years: No If all of the above answers are "NO", then may proceed with Cephalosporin use.  . Shellfish Allergy     Throat swells and cant breath    Review of Systems  Constitutional: Negative.   Eyes: Negative.   Respiratory: Negative.   Cardiovascular: Negative.   Gastrointestinal: Positive for abdominal pain and diarrhea.  Endocrine: Negative.   Genitourinary: Negative.   Musculoskeletal: Negative.        Leg weakness   Skin: Negative.   Allergic/Immunologic: Negative.   Neurological: Positive for headaches.  Hematological: Negative.   Psychiatric/Behavioral: Negative.     Social History  Substance Use Topics  . Smoking status: Former Smoker -- 2.00 packs/day for 16 years    Types: Cigarettes    Quit date: 10/19/2010  .  Smokeless tobacco: Never Used  . Alcohol Use: No   Objective:   BP 112/76 mmHg  Pulse 82  Temp(Src) 98.8 F (37.1 C) (Oral)  Resp 16  Wt 209 lb 12.8 oz (95.165 kg)  Physical Exam  Constitutional: He is oriented to person, place, and time. He appears well-developed and well-nourished. No distress.  HENT:  Head: Normocephalic and atraumatic.  Right Ear: Hearing normal.  Left Ear: Hearing normal.  Nose: Nose normal.  Eyes: Conjunctivae and lids are normal. Right eye exhibits no discharge. Left eye exhibits no  discharge. No scleral icterus.  Neck: Neck supple.  Pulmonary/Chest: Effort normal. No respiratory distress.  Abdominal: Soft. Bowel sounds are normal. He exhibits no mass. There is no guarding.  Mild lower periumbilical soreness to deep palpation. No external thrombosed hemorrhoids or skin tags.  Musculoskeletal: Normal range of motion.  Neurological: He is alert and oriented to person, place, and time.  Skin: Skin is intact. No lesion and no rash noted.  Psychiatric: He has a normal mood and affect. His speech is normal and behavior is normal. Thought content normal.      Assessment & Plan:     1. Internal hemorrhoids Bright red blood on tissue with diarrhea stools today. Has a history of internal hemorrhoids found on colonoscopy by Dr. Mechele Collin (GI) in 2014. No polyps, diverticula or growths of concern at that time. No external hemorrhoids or tenderness. No pain with BM. Will treat with Anusol-HC and follow up if not better in 5 days. - hydrocortisone (ANUSOL-HC) 25 MG suppository; Place 1 suppository (25 mg total) rectally 2 (two) times daily.  Dispense: 12 suppository; Refill: 0  2. IBS (irritable bowel syndrome) History of occasional flares of diarrhea. No recent constipation. Denies vomiting but having some slight weakness and hot flash prior to onset on 01-19-16. Had follow up with Dr. Mechele Collin in 2014 but was unable to accomplish upper endoscopy due to strong gag reflex. May use Imodium-AD prn diarrhea and add probiotic supplement. If not controlled in 5-6 days, may need recheck with Dr. Mechele Collin (GI).

## 2016-06-05 ENCOUNTER — Encounter: Payer: Self-pay | Admitting: Family Medicine

## 2016-06-05 ENCOUNTER — Ambulatory Visit (INDEPENDENT_AMBULATORY_CARE_PROVIDER_SITE_OTHER): Payer: Managed Care, Other (non HMO) | Admitting: Family Medicine

## 2016-06-05 VITALS — BP 104/60 | HR 72 | Temp 98.8°F | Resp 16 | Wt 209.0 lb

## 2016-06-05 DIAGNOSIS — R0789 Other chest pain: Secondary | ICD-10-CM

## 2016-06-05 DIAGNOSIS — G8929 Other chronic pain: Secondary | ICD-10-CM | POA: Diagnosis not present

## 2016-06-05 DIAGNOSIS — M546 Pain in thoracic spine: Secondary | ICD-10-CM | POA: Diagnosis not present

## 2016-06-05 DIAGNOSIS — M549 Dorsalgia, unspecified: Secondary | ICD-10-CM

## 2016-06-05 MED ORDER — MELOXICAM 15 MG PO TABS: 15.0000 mg | ORAL_TABLET | Freq: Every day | ORAL | 0 refills | Status: DC

## 2016-06-05 NOTE — Patient Instructions (Signed)
We will call you with the lab work. 

## 2016-06-05 NOTE — Progress Notes (Signed)
Subjective:     Patient ID: Derek Ramirez, male   DOB: Mar 10, 1976, 40 y.o.   MRN: [id number]  HPI  Chief Complaint  Patient presents with  . Generalized Body Aches    Patient comes in office today with complaints of body and muscle ache for the past 3 months. Patient denies any associated symptoms.  States he has migrating muscle pain in his bilateral chest wall and bilateral upper back area. States it bothers him now 4-5 x week but not on presentation today. Does not involve other parts of his body. No fever or chills. 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 His duties vary from pushing buttons to moving his arms laterally up to 50 x shift. No heavy lifting reported. Reports that he has had ticks on him this season. Denies unusual stress.   Review of Systems     Objective:   Physical Exam  Constitutional: He appears well-developed and well-nourished. No distress.  Musculoskeletal:  Bilateral shoulders with FROM.       Assessment:    1. Chronic chest wall pain - meloxicam (MOBIC) 15 MG tablet; Take 1 tablet (15 mg total) by mouth daily.  Dispense: 30 tablet; Refill: 0 - Comprehensive metabolic panel - Sedimentation rate - Magnesium - Lyme Disease, IgM, Early Test w/ Rflx  2. Upper back pain - meloxicam (MOBIC) 15 MG tablet; Take 1 tablet (15 mg total) by mouth daily.  Dispense: 30 tablet; Refill: 0 - Comprehensive metabolic panel - Sedimentation rate - Magnesium - Lyme Disease, IgM, Early Test w/ Rflx    Plan:    Further f/u pending lab work. Asked him to try to correlate his pains with his work activity for that day.

## 2016-06-08 ENCOUNTER — Telehealth: Payer: Self-pay

## 2016-06-08 LAB — COMPREHENSIVE METABOLIC PANEL
ALBUMIN: 4.5 g/dL (ref 3.5–5.5)
ALK PHOS: 80 IU/L (ref 39–117)
ALT: 12 IU/L (ref 0–44)
AST: 15 IU/L (ref 0–40)
Albumin/Globulin Ratio: 1.9 (ref 1.2–2.2)
BUN / CREAT RATIO: 15 (ref 9–20)
BUN: 15 mg/dL (ref 6–20)
Bilirubin Total: 0.5 mg/dL (ref 0.0–1.2)
CALCIUM: 9.7 mg/dL (ref 8.7–10.2)
CO2: 24 mmol/L (ref 18–29)
CREATININE: 1 mg/dL (ref 0.76–1.27)
Chloride: 101 mmol/L (ref 96–106)
GFR, EST AFRICAN AMERICAN: 111 mL/min/{1.73_m2} (ref >59)
GFR, EST NON AFRICAN AMERICAN: 96 mL/min/{1.73_m2} (ref >59)
GLUCOSE: 86 mg/dL (ref 65–99)
Globulin, Total: 2.4 g/dL (ref 1.5–4.5)
Potassium: 4.4 mmol/L (ref 3.5–5.2)
Sodium: 138 mmol/L (ref 134–144)
TOTAL PROTEIN: 6.9 g/dL (ref 6.0–8.5)

## 2016-06-08 LAB — LYME, IGM, EARLY TEST/REFLEX: LYME DISEASE AB, QUANT, IGM: <0.8 index (ref 0.00–0.79)

## 2016-06-08 LAB — SEDIMENTATION RATE: SED RATE: 3 mm/h (ref 0–15)

## 2016-06-08 LAB — MAGNESIUM: Magnesium: 2.2 mg/dL (ref 1.6–2.3)

## 2016-06-08 NOTE — Telephone Encounter (Signed)
Tried calling; no answer.  06/08/2016   Thanks,   -Laura  

## 2016-06-08 NOTE — Telephone Encounter (Signed)
-----   Message from Anola Gurneyobert Chauvin, [place] sent at 06/08/2016  7:38 AM EDT ----- Lab looks good with Lyme disease lab pending later this week.

## 2016-06-08 NOTE — Telephone Encounter (Signed)
Pt advised.   Thanks,   -Laura  

## 2016-07-01 ENCOUNTER — Other Ambulatory Visit: Payer: Self-pay | Admitting: Family Medicine

## 2016-07-01 DIAGNOSIS — R0789 Other chest pain: Principal | ICD-10-CM

## 2016-07-01 DIAGNOSIS — M549 Dorsalgia, unspecified: Secondary | ICD-10-CM

## 2016-07-01 DIAGNOSIS — G8929 Other chronic pain: Secondary | ICD-10-CM

## 2016-08-01 ENCOUNTER — Other Ambulatory Visit: Payer: Self-pay | Admitting: Family Medicine

## 2016-08-01 DIAGNOSIS — K219 Gastro-esophageal reflux disease without esophagitis: Secondary | ICD-10-CM

## 2016-09-07 ENCOUNTER — Emergency Department: Payer: Managed Care, Other (non HMO)

## 2016-09-07 ENCOUNTER — Emergency Department
Admission: EM | Admit: 2016-09-07 | Discharge: 2016-09-07 | Disposition: A | Discharge status: Home / Self Care | Payer: Managed Care, Other (non HMO) | Attending: Emergency Medicine | Admitting: Emergency Medicine

## 2016-09-07 DIAGNOSIS — R1011 Right upper quadrant pain: Secondary | ICD-10-CM

## 2016-09-07 DIAGNOSIS — J45909 Unspecified asthma, uncomplicated: Secondary | ICD-10-CM | POA: Insufficient documentation

## 2016-09-07 DIAGNOSIS — R112 Nausea with vomiting, unspecified: Secondary | ICD-10-CM

## 2016-09-07 DIAGNOSIS — R197 Diarrhea, unspecified: Secondary | ICD-10-CM | POA: Diagnosis not present

## 2016-09-07 DIAGNOSIS — Z79899 Other long term (current) drug therapy: Secondary | ICD-10-CM | POA: Insufficient documentation

## 2016-09-07 DIAGNOSIS — Z87891 Personal history of nicotine dependence: Secondary | ICD-10-CM | POA: Diagnosis not present

## 2016-09-07 HISTORY — DX: Personal history of other diseases of the digestive system: Z87.19

## 2016-09-07 LAB — CBC WITH DIFFERENTIAL/PLATELET
Basophils Absolute: 0 10*3/uL (ref 0–0.1)
Basophils Relative: 0 %
EOS PCT: 1 %
Eosinophils Absolute: 0.2 10*3/uL (ref 0–0.7)
HEMATOCRIT: 50.6 % (ref 40.0–52.0)
HEMOGLOBIN: 17.8 g/dL (ref 13.0–18.0)
LYMPHS ABS: 1.7 10*3/uL (ref 1.0–3.6)
LYMPHS PCT: 9 %
MCH: 29 pg (ref 26.0–34.0)
MCHC: 35.1 g/dL (ref 32.0–36.0)
MCV: 82.5 fL (ref 80.0–100.0)
Monocytes Absolute: 1.4 10*3/uL — ABNORMAL HIGH (ref 0.2–1.0)
Monocytes Relative: 7 %
Neutro Abs: 16.4 10*3/uL — ABNORMAL HIGH (ref 1.4–6.5)
Neutrophils Relative %: 83 %
PLATELETS: 311 10*3/uL (ref 150–440)
RBC: 6.13 MIL/uL — AB (ref 4.40–5.90)
RDW: 13.6 % (ref 11.5–14.5)
WBC: 19.7 10*3/uL — AB (ref 3.8–10.6)

## 2016-09-07 LAB — COMPREHENSIVE METABOLIC PANEL
ALBUMIN: 5.2 g/dL — AB (ref 3.5–5.0)
ALK PHOS: 83 U/L (ref 38–126)
ALT: 21 U/L (ref 17–63)
ANION GAP: 10 (ref 5–15)
AST: 24 U/L (ref 15–41)
BUN: 23 mg/dL — ABNORMAL HIGH (ref 6–20)
CALCIUM: 9.9 mg/dL (ref 8.9–10.3)
CO2: 25 mmol/L (ref 22–32)
CREATININE: 1.45 mg/dL — AB (ref 0.61–1.24)
Chloride: 101 mmol/L (ref 101–111)
GFR calc Af Amer: >60 mL/min (ref >60)
GFR calc non Af Amer: 60 mL/min — ABNORMAL LOW (ref >60)
GLUCOSE: 110 mg/dL — AB (ref 65–99)
Potassium: 3.9 mmol/L (ref 3.5–5.1)
SODIUM: 136 mmol/L (ref 135–145)
Total Bilirubin: 1.2 mg/dL (ref 0.3–1.2)
Total Protein: 8.6 g/dL — ABNORMAL HIGH (ref 6.5–8.1)

## 2016-09-07 LAB — URINALYSIS COMPLETE WITH MICROSCOPIC (ARMC ONLY)
Bacteria, UA: NONE SEEN
GLUCOSE, UA: NEGATIVE mg/dL
Hgb urine dipstick: NEGATIVE
LEUKOCYTES UA: NEGATIVE
NITRITE: NEGATIVE
Protein, ur: 100 mg/dL — AB
SPECIFIC GRAVITY, URINE: 1.032 — AB (ref 1.005–1.030)
Squamous Epithelial / LPF: NONE SEEN
pH: 5 (ref 5.0–8.0)

## 2016-09-07 LAB — LACTIC ACID, PLASMA: Lactic Acid, Venous: 1.2 mmol/L (ref 0.5–1.9)

## 2016-09-07 LAB — LIPASE, BLOOD: Lipase: 33 U/L (ref 11–51)

## 2016-09-07 MED ORDER — SODIUM CHLORIDE 0.9 % IV SOLN
Freq: Once | INTRAVENOUS | Status: AC
  Administered 2016-09-07: 1000 mL via INTRAVENOUS

## 2016-09-07 MED ORDER — ONDANSETRON HCL 4 MG/2ML IJ SOLN
4.0000 mg | Freq: Once | INTRAMUSCULAR | Status: AC
  Administered 2016-09-07: 4 mg via INTRAVENOUS
  Filled 2016-09-07: qty 2

## 2016-09-07 MED ORDER — ONDANSETRON 4 MG PO TBDP: 4.0000 mg | ORAL_TABLET | Freq: Three times a day (TID) | ORAL | 0 refills | Status: DC | PRN

## 2016-09-07 NOTE — ED Notes (Signed)
Pt up to bathroom to attempt to provide urine sample at this time.

## 2016-09-07 NOTE — ED Notes (Signed)
Pt able to eat and drink without emesis. Awaiting update by MD.

## 2016-09-07 NOTE — ED Notes (Signed)
Patient transported to Ultrasound 

## 2016-09-07 NOTE — ED Notes (Addendum)
Pt placed on enteric precautions by this RN. Sign on door and PPE hanging on door.

## 2016-09-07 NOTE — ED Triage Notes (Signed)
Pt c/o RUQ pain with N/V/D, states he has had over 30 loose stools over the past couple of days.

## 2016-09-07 NOTE — ED Notes (Signed)
Pt c/o RUQ pain, vomit X 3 and diarrhea x 30 in last 3 days.

## 2016-09-07 NOTE — ED Notes (Addendum)
Pt attempted to give stool sample, unable to go.  Gave urine sample, sent to lab.

## 2016-09-07 NOTE — Discharge Instructions (Signed)
If you have some more nausea I will give you a prescription for Zofran melt on your time 13 times a day as needed. He can try Pepto-Bismol if you have any more diarrhea. If you have vomiting and cannot keep down fluids for the diarrhea gets very bad like it was previously come back. Also please return for fever or for a bad abdominal pain. Follow-up with your regular doctor please.

## 2016-09-07 NOTE — ED Notes (Signed)
Pt back from ultrasound.

## 2016-09-07 NOTE — ED Provider Notes (Signed)
[facility] Emergency Department Provider Note   ____________________________________________   First MD Initiated Contact with Patient 09/07/16 0930     (approximate)  I have reviewed the triage vital signs and the nursing notes.   HISTORY  Chief Complaint Abdominal Pain   HPI Sharlyne PacasKarl Gato is a 40 y.o. male who reports he had vomiting and diarrhea with some nausea last night and today. He vomited 3 or 4 times.  He's had 30-40 episodes of diarrhea. Other than the diarrhea is very watery. He has little bit of right upper quadrant pain with it and occasional cramps before diarrhea. He felt a little bit lightheaded on arrival. After a liter fluid he feels much better he has no more diarrhea. We'll tolerate by mouth. Abdominal pain goes away.   Past Medical History:  Diagnosis Date  . History of chicken pox   . History of IBS   . History of measles   . Panic disorder     Patient Active Problem List   Diagnosis Date Noted  . Allergic rhinitis 10/31/2015  . Anxiety 10/31/2015  . Chest pain 10/31/2015  . GERD (gastroesophageal reflux disease) 10/31/2015  . Heartburn 10/31/2015  . Palpitations 10/31/2015  . Panic disorder 10/31/2015  . 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 Headache, tension-type 03/01/2008  . Tobacco use 03/17/2006  . 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 No pertinent surgical history.  Prior to Admission medications   Medication Sig Start Date End Date Taking? Authorizing Provider  pantoprazole (PROTONIX) 20 MG tablet TAKE 1 TABLET BY MOUTH DAILY 08/01/16  Yes Malva Limesonald E Fisher, MD  sertraline (ZOLOFT) 50 MG tablet Take 0.5-1 tablets (25-50 mg total) by mouth daily. Reported on 11/01/2015 Patient taking differently: Take 50 mg by mouth daily. Reported on 11/01/2015 11/01/15   Malva Limesonald E Fisher, MD    Allergies Penicillins and Shellfish allergy  Family History  Problem Relation Age of Onset  .  Diabetes Maternal Grandmother   . Diabetes Maternal Grandfather   . Lung cancer Paternal Grandmother     Due to Asbestos  . Lung cancer Paternal Grandfather     due to Asbestos  . Heart Problems Paternal Grandfather     Social History Social History  Substance Use Topics  . Smoking status: Former Smoker    Packs/day: 2.00    Years: 16.00    Types: Cigarettes    Quit date: 10/19/2010  . Smokeless tobacco: Never Used  . Alcohol use No    Review of Systems Constitutional: No fever/chills Eyes: No visual changes. ENT: No sore throat. Cardiovascular: Denies chest pain. Respiratory: Denies shortness of breath. Gastrointestinal: No abdominal pain.  No nausea, no vomiting.  No diarrhea.  No constipation. Genitourinary: Negative for dysuria. Musculoskeletal: Negative for back pain. Skin: Negative for rash. Neurological: Negative for headaches, focal weakness or numbness.  10-point ROS otherwise negative.  ____________________________________________   PHYSICAL EXAM:  VITAL SIGNS: ED Triage Vitals  Enc Vitals Group     BP 09/07/16 0927 116/86     Pulse Rate 09/07/16 0927 (!) 124     Resp 09/07/16 0927 17     Temp 09/07/16 0927 98.5 F (36.9 C)     Temp Source 09/07/16 0927 Oral     SpO2 09/07/16 0927 97 %     Weight 09/07/16 0927 205 lb (93 kg)     Height 09/07/16 0927 5\' 11"  (1.803 m)     Head Circumference --  Peak Flow --      Pain Score 09/07/16 0928 3     Pain Loc --      Pain Edu? --      Excl. in GC? --     Constitutional: Alert and oriented. Well appearing and in no acute distress. Eyes: Conjunctivae are normal. PERRL. EOMI. Head: Atraumatic. Nose: No congestion/rhinnorhea. Mouth/Throat: Mucous membranes are moist.  Oropharynx non-erythematous. Neck: No stridor. Cardiovascular: Normal rate, regular rhythm. Grossly normal heart sounds.  Good peripheral circulation. Respiratory: Normal respiratory effort.  No retractions. Lungs CTAB. Gastrointestinal:  Initially abdomen was soft with some minimal tenderness in right upper quadrant. Later Soft and nontender. No distention. No abdominal bruits. No CVA tenderness. Musculoskeletal: No lower extremity tenderness nor edema.  No joint effusions. Neurologic:  Normal speech and language. No gross focal neurologic deficits are appreciated. No gait instability. Skin:  Skin is warm, dry and intact. No rash noted.   ____________________________________________   LABS (all labs ordered are listed, but only abnormal results are displayed)  Labs Reviewed  COMPREHENSIVE METABOLIC PANEL - Abnormal; Notable for the following:       Result Value   Glucose, Bld 110 (*)    BUN 23 (*)    Creatinine, Ser 1.45 (*)    Total Protein 8.6 (*)    Albumin 5.2 (*)    GFR calc non Af Amer 60 (*)    All other components within normal limits  CBC WITH DIFFERENTIAL/PLATELET - Abnormal; Notable for the following:    WBC 19.7 (*)    RBC 6.13 (*)    Neutro Abs 16.4 (*)    Monocytes Absolute 1.4 (*)    All other components within normal limits  URINALYSIS COMPLETEWITH MICROSCOPIC ([facility] ONLY) - Abnormal; Notable for the following:    Color, Urine AMBER (*)    APPearance CLEAR (*)    Bilirubin Urine 1+ (*)    Ketones, ur TRACE (*)    Specific Gravity, Urine 1.032 (*)    Protein, ur 100 (*)    All other components within normal limits  GASTROINTESTINAL PANEL BY PCR, STOOL (REPLACES STOOL CULTURE)  C DIFFICILE QUICK SCREEN W PCR REFLEX  LIPASE, BLOOD  LACTIC ACID, PLASMA   ____________________________________________  EKG   ____________________________________________  RADIOLOGY  Study Result   CLINICAL DATA:  40 year old male with right upper quadrant abdominal pain for 24 hours. Vomiting and diarrhea. Initial encounter.  EXAM: US ABDOMEN LIMITED - RIGHT UPPER QUADRANT  COMPARISON:  Abdomen ultrasound 10/28/2010  FINDINGS: Gallbladder:  No gallstones or wall thickening visualized. No  sonographic Murphy sign noted by sonographer.  Common bile duct:  Diameter: 2 mm, normal  Liver:  No focal lesion identified. Within normal limits in parenchymal echogenicity.  Other findings: Negative visible right kidney.  No free fluid.  IMPRESSION: Normal gallbladder.  Normal right upper quadrant ultrasound.   Electronically Signed   By: Odessa FlemingH  Hall M.D.   On: 09/07/2016 11:28    ____________________________________________   PROCEDURES  Procedure(s) performed:  Procedures  Critical Care performed:      ____________________________________________   INITIAL IMPRESSION / ASSESSMENT AND PLAN / ED COURSE  Pertinent labs & imaging results that were available during my care of the patient were reviewed by me and considered in my medical decision making (see chart for details).    Clinical Course   Patient has not had any diarrhea in the emergency room he has no more belly pain he feels fine his urine is clear  his ultrasound is okay I will discharge him and let him go home he will come back is worse. ____________________________________________   FINAL CLINICAL IMPRESSION(S) / ED DIAGNOSES  Final diagnoses:  Nausea vomiting and diarrhea      NEW MEDICATIONS STARTED DURING THIS VISIT:  New Prescriptions   No medications on file     Note:  This document was prepared using Dragon voice recognition software and may include unintentional dictation errors.    Arnaldo Natal, MD 09/07/16 [phone]

## 2016-09-07 NOTE — ED Notes (Signed)
Pt alert and oriented X4, active, cooperative, pt in NAD. RR even and unlabored, color WNL.  Pt informed to return if any life threatening symptoms occur.   

## 2016-09-08 ENCOUNTER — Other Ambulatory Visit: Payer: Self-pay | Admitting: Family Medicine

## 2016-09-08 DIAGNOSIS — F419 Anxiety disorder, unspecified: Secondary | ICD-10-CM

## 2017-06-02 ENCOUNTER — Other Ambulatory Visit: Payer: Self-pay | Admitting: Family Medicine

## 2017-06-02 DIAGNOSIS — F419 Anxiety disorder, unspecified: Secondary | ICD-10-CM

## 2017-07-27 ENCOUNTER — Emergency Department (HOSPITAL_COMMUNITY)
Admission: EM | Admit: 2017-07-27 | Discharge: 2017-07-27 | Disposition: A | Discharge status: Home / Self Care | Payer: No Typology Code available for payment source | Attending: Emergency Medicine | Admitting: Emergency Medicine

## 2017-07-27 ENCOUNTER — Emergency Department (HOSPITAL_COMMUNITY): Payer: No Typology Code available for payment source

## 2017-07-27 ENCOUNTER — Encounter (HOSPITAL_COMMUNITY): Payer: Self-pay | Admitting: Emergency Medicine

## 2017-07-27 DIAGNOSIS — M25512 Pain in left shoulder: Secondary | ICD-10-CM | POA: Insufficient documentation

## 2017-07-27 DIAGNOSIS — Z87891 Personal history of nicotine dependence: Secondary | ICD-10-CM | POA: Insufficient documentation

## 2017-07-27 MED ORDER — LIDOCAINE 5 % EX PTCH
1.0000 | MEDICATED_PATCH | CUTANEOUS | 0 refills | Status: DC
Start: 2017-07-27 — End: 2018-03-11

## 2017-07-27 MED ORDER — METHOCARBAMOL 500 MG PO TABS: 500.0000 mg | ORAL_TABLET | Freq: Two times a day (BID) | ORAL | 0 refills | Status: DC

## 2017-07-27 MED ORDER — KETOROLAC TROMETHAMINE 60 MG/2ML IM SOLN
60.0000 mg | Freq: Once | INTRAMUSCULAR | Status: AC
  Administered 2017-07-27: 60 mg via INTRAMUSCULAR
  Filled 2017-07-27: qty 2

## 2017-07-27 MED ORDER — IBUPROFEN 600 MG PO TABS: 600.0000 mg | ORAL_TABLET | Freq: Four times a day (QID) | ORAL | 0 refills | Status: DC | PRN
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
## 2017-07-27 NOTE — ED Notes (Signed)
Patient transported to X-ray 

## 2017-07-27 NOTE — Discharge Instructions (Signed)
There were no acute abnormalities noted on the x-ray. Expect your soreness to increase over the next 2-3 days. Take it easy, but do not lay around too much as this may make any stiffness worse.  Antiinflammatory medications: Take 600 mg of ibuprofen every 6 hours or 440 mg (over the counter dose) to 500 mg (prescription dose) of naproxen every 12 hours for the next 3 days. After this time, these medications may be used as needed for pain. Take these medications with food to avoid upset stomach. Choose only one of these medications, do not take them together.  Tylenol: Should you continue to have additional pain while taking the ibuprofen or naproxen, you may add in tylenol as needed. Your daily total maximum amount of tylenol from all sources should be limited to /day for persons without liver problems, or /day for those with liver problems. Muscle relaxer: Robaxin is a muscle relaxer and may help loosen stiff muscles. Do not take the Robaxin while driving or performing other dangerous activities.  Lidocaine patches: These are available via either prescription or over-the-counter. The over-the-counter option may be more economical one and are likely just as effective. There are multiple over-the-counter brands, such as Salonpas. Exercises: Be sure to perform the attached exercises starting with three times a week and working up to performing them daily. This is an essential part of preventing long term problems.   Follow up with a primary care provider or orthopedic specialist for any future management of these complaints.
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
## 2017-08-24 ENCOUNTER — Other Ambulatory Visit: Payer: Self-pay | Admitting: Family Medicine

## 2017-08-24 DIAGNOSIS — K219 Gastro-esophageal reflux disease without esophagitis: Secondary | ICD-10-CM

## 2017-12-24 ENCOUNTER — Other Ambulatory Visit: Payer: Self-pay | Admitting: Family Medicine

## 2017-12-24 DIAGNOSIS — F419 Anxiety disorder, unspecified: Secondary | ICD-10-CM

## 2018-02-20 ENCOUNTER — Other Ambulatory Visit: Payer: Self-pay | Admitting: Family Medicine

## 2018-02-20 DIAGNOSIS — F419 Anxiety disorder, unspecified: Secondary | ICD-10-CM

## 2018-03-11 ENCOUNTER — Encounter: Payer: Self-pay | Admitting: Family Medicine

## 2018-03-11 ENCOUNTER — Ambulatory Visit: Payer: Managed Care, Other (non HMO) | Admitting: Family Medicine

## 2018-03-11 ENCOUNTER — Other Ambulatory Visit: Payer: Self-pay | Admitting: Family Medicine

## 2018-03-11 VITALS — BP 112/64 | HR 91 | Temp 99.0°F | Resp 16 | Wt 227.0 lb

## 2018-03-11 DIAGNOSIS — K219 Gastro-esophageal reflux disease without esophagitis: Secondary | ICD-10-CM | POA: Diagnosis not present

## 2018-03-11 DIAGNOSIS — F419 Anxiety disorder, unspecified: Secondary | ICD-10-CM | POA: Diagnosis not present

## 2018-03-11 MED ORDER — SERTRALINE HCL 50 MG PO TABS: 50.0000 mg | ORAL_TABLET | Freq: Every day | ORAL | 11 refills | Status: DC

## 2018-03-11 MED ORDER — ALPRAZOLAM 0.25 MG PO TABS: 0.2500 mg | ORAL_TABLET | Freq: Four times a day (QID) | ORAL | 1 refills | Status: DC | PRN

## 2018-03-11 NOTE — Patient Instructions (Signed)
   You are due for a tetanus-diptheria-pertussis vaccine (Tdap) You can usually get these at your local pharmacy, or you can call or office at your convenience to schedule this shot.

## 2018-03-11 NOTE — Telephone Encounter (Signed)
Patient has not been seen in 2 1/2 years. Please advise cannot continue to fill medication without being seen for follow up.

## 2018-03-11 NOTE — Progress Notes (Signed)
Patient: Derek Ramirez Male    DOB: [birth year]   42 y.o.   MRN: [id number] Visit Date: 03/11/2018  Today's Provider: Mila Merry, MD   Chief Complaint  Patient presents with  . Follow-up  . Anxiety   Subjective:    HPI   Anxiety From 11/01/2015-restarted sertraline 50 mg. Advised he may also take occasional alprazolam 0.25 mg. He states sertraline is working well, taking one every day. Takes alprazolam a few time a month and works well when he needs it.   Also follow up GERD. Takes pantoprazole every day which remains effective. He gets mild heartburn if he misses a day and severe heartburn if he misses two days. Is trying to avoid foods that typically aggravate heartburn.    Allergies  Allergen Reactions  . Penicillins Anaphylaxis     Current Outpatient Medications:  .  ibuprofen (ADVIL,MOTRIN) 600 MG tablet, Take 1 tablet (600 mg total) by mouth every 6 (six) hours as needed., Disp: 30 tablet, Rfl: 0 .  pantoprazole (PROTONIX) 20 MG tablet, TAKE 1 TABLET BY MOUTH DAILY, Disp: 30 tablet, Rfl: 8 .  sertraline (ZOLOFT) 50 MG tablet, Take 1 tablet (50 mg total) by mouth daily., Disp: 30 tablet, Rfl: 11 .  ALPRAZolam (XANAX) 0.25 MG tablet, Take 1 tablet (0.25 mg total) by mouth every 6 (six) hours as needed., Disp: 30 tablet, Rfl: 1  Review of Systems  Constitutional: Negative for appetite change, chills and fever.  Respiratory: Negative for chest tightness, shortness of breath and wheezing.   Cardiovascular: Negative for chest pain and palpitations.  Gastrointestinal: Negative for abdominal pain, nausea and vomiting.    Social History   Tobacco Use  . Smoking status: Former Smoker    Packs/day: 2.00    Years: 16.00    Pack years: 32.00    Types: Cigarettes    Last attempt to quit: 10/19/2010    Years since quitting: 7.3  . Smokeless tobacco: Never Used  Substance Use Topics  . Alcohol use: No    Alcohol/week: 0.0 oz   Objective:   BP 112/64 (BP Location:  Right Arm, Patient Position: Sitting, Cuff Size: Large)   Pulse 91   Temp 99 F (37.2 C) (Oral)   Resp 16   Wt 227 lb (103 kg)   SpO2 98%   BMI 31.66 kg/m  Vitals:   03/11/18 1345  BP: 112/64  Pulse: 91  Resp: 16  Temp: 99 F (37.2 C)  TempSrc: Oral  SpO2: 98%  Weight: 227 lb (103 kg)     Physical Exam   General Appearance:    Alert, cooperative, no distress  Eyes:    PERRL, conjunctiva/corneas clear, EOM's intact       Lungs:     Clear to auscultation bilaterally, respirations unlabored  Heart:    Regular rate and rhythm  Neurologic:   Awake, alert, oriented x 3. No apparent focal neurological           defect.           Assessment & Plan:     .1. Anxiety Doing well with current medications which will be continued.  - sertraline (ZOLOFT) 50 MG tablet; Take 1 tablet (50 mg total) by mouth daily.  Dispense: 30 tablet; Refill: 11 - ALPRAZolam (XANAX) 0.25 MG tablet; Take 1 tablet (0.25 mg total) by mouth every 6 (six) hours as needed.  Dispense: 30 tablet; Refill: 1  2. Gastroesophageal reflux disease without esophagitis Well  controlled on pantoprazole.   He declined Tdap today. Counseled on recommendation for vaccine.        Mila Merry, MD  [facility]

## 2018-04-11 ENCOUNTER — Other Ambulatory Visit: Payer: Self-pay | Admitting: Family Medicine

## 2018-04-11 DIAGNOSIS — F419 Anxiety disorder, unspecified: Secondary | ICD-10-CM

## 2018-04-25 ENCOUNTER — Other Ambulatory Visit: Payer: Self-pay | Admitting: Family Medicine

## 2018-04-25 DIAGNOSIS — K219 Gastro-esophageal reflux disease without esophagitis: Secondary | ICD-10-CM

## 2018-05-05 ENCOUNTER — Emergency Department: Payer: No Typology Code available for payment source

## 2018-05-05 ENCOUNTER — Other Ambulatory Visit: Payer: Self-pay

## 2018-05-05 ENCOUNTER — Emergency Department
Admission: EM | Admit: 2018-05-05 | Discharge: 2018-05-05 | Disposition: A | Discharge status: Home / Self Care | Payer: No Typology Code available for payment source | Attending: Emergency Medicine | Admitting: Emergency Medicine

## 2018-05-05 DIAGNOSIS — Z79899 Other long term (current) drug therapy: Secondary | ICD-10-CM | POA: Insufficient documentation

## 2018-05-05 DIAGNOSIS — M5126 Other intervertebral disc displacement, lumbar region: Secondary | ICD-10-CM

## 2018-05-05 DIAGNOSIS — Z87891 Personal history of nicotine dependence: Secondary | ICD-10-CM | POA: Diagnosis not present

## 2018-05-05 DIAGNOSIS — M545 Low back pain: Secondary | ICD-10-CM | POA: Diagnosis present

## 2018-05-05 MED ORDER — LIDOCAINE 1.8 % EX PTCH: 1.0000 "application " | MEDICATED_PATCH | Freq: Two times a day (BID) | CUTANEOUS | 0 refills | Status: DC

## 2018-05-05 MED ORDER — LIDOCAINE 5 % EX PTCH
1.0000 | MEDICATED_PATCH | Freq: Once | CUTANEOUS | Status: DC
  Administered 2018-05-05: 1 via TRANSDERMAL
  Filled 2018-05-05: qty 1

## 2018-05-05 NOTE — ED Provider Notes (Signed)
[facility] Emergency Department Provider Note   ____________________________________________   First MD Initiated Contact with Patient 05/05/18 0915     (approximate)  I have reviewed the triage vital signs and the nursing notes.   HISTORY  Chief Complaint Back Pain   HPI Derek Ramirez is a 42 y.o. male patient complain of radicular back pain secondary to left incident 2 days ago.  Patient states he lifted a 120 pound object onto a table and developed back pain.  Patient was initially seen by the [facility] clinic and states no improvement with pain medication, steroids, and muscle relaxers.  Return to [facility] today with increased radicular complaint.  Patient denies bladder bowel dysfunction.  It was noted on exam today he has decreased two-point discrimination on his neuro exam of the lower extremities.  Patient was sent to the ED for definitive evaluation.   Past Medical History:  Diagnosis Date  . History of chicken pox   . History of IBS   . History of measles   . Panic disorder     Patient Active Problem List   Diagnosis Date Noted  . Allergic rhinitis 10/31/2015  . Anxiety 10/31/2015  . Chest pain 10/31/2015  . GERD (gastroesophageal reflux disease) 10/31/2015  . Heartburn 10/31/2015  . Palpitations 10/31/2015  . Panic disorder 10/31/2015  . 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 Headache, tension-type 03/01/2008  . Tobacco use 03/17/2006  . 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 No pertinent surgical history.  Prior to Admission medications   Medication Sig Start Date End Date Taking? Authorizing Provider  HYDROcodone-acetaminophen (NORCO/VICODIN) 5-325 MG tablet Take 1 tablet by mouth every 6 (six) hours as needed for moderate pain.   Yes [provider]  metaxalone (SKELAXIN) 800 MG tablet Take 800 mg by mouth 3 (three) times daily.   Yes [provider]  predniSONE (DELTASONE) 20  MG tablet Take 40 mg by mouth daily with breakfast.   Yes [provider]  ALPRAZolam (XANAX) 0.25 MG tablet Take 1 tablet (0.25 mg total) by mouth every 6 (six) hours as needed. 03/11/18   Malva Limes, MD  ibuprofen (ADVIL,MOTRIN) 600 MG tablet Take 1 tablet (600 mg total) by mouth every 6 (six) hours as needed. 07/27/17   Joy, Shawn C, PA-C  Lidocaine (ZTLIDO) 1.8 % PTCH Apply 1 application topically 2 (two) times daily. 05/05/18   Joni Reining, PA-C  pantoprazole (PROTONIX) 20 MG tablet TAKE 1 TABLET BY MOUTH DAILY 04/25/18   Malva Limes, MD  sertraline (ZOLOFT) 50 MG tablet Take 1 tablet (50 mg total) by mouth daily. 04/11/18   Malva Limes, MD    Allergies Penicillins and Shellfish allergy  Family History  Problem Relation Age of Onset  . Diabetes Maternal Grandmother   . Diabetes Maternal Grandfather   . Lung cancer Paternal Grandmother        Due to Asbestos  . Lung cancer Paternal Grandfather        due to Asbestos  . Heart Problems Paternal Grandfather     Social History Social History   Tobacco Use  . Smoking status: Former Smoker    Packs/day: 2.00    Years: 16.00    Pack years: 32.00    Types: Cigarettes    Last attempt to quit: 10/19/2010    Years since quitting: 7.5  . Smokeless tobacco: Never Used  Substance Use Topics  . Alcohol use: No  Alcohol/week: 0.0 oz  . Drug use: No    Review of Systems Constitutional: No fever/chills Eyes: No visual changes. ENT: No sore throat. Cardiovascular: Denies chest pain. Respiratory: Denies shortness of breath. Gastrointestinal: No abdominal pain.  No nausea, no vomiting.  No diarrhea.  No constipation. Genitourinary: Negative for dysuria. Musculoskeletal: Positive for back pain. Skin: Negative for rash. Neurological: Negative for headaches, focal weakness or numbness. Psychiatric:Anxiety. Allergic/Immunilogical: Penicillin and  shellfish. ____________________________________________   PHYSICAL EXAM:  VITAL SIGNS: ED Triage Vitals  Enc Vitals Group     BP 05/05/18 0857 (!) 140/100     Pulse Rate 05/05/18 0857 64     Resp 05/05/18 0857 16     Temp 05/05/18 0857 98.6 F (37 C)     Temp Source 05/05/18 0857 Oral     SpO2 05/05/18 0857 100 %     Weight 05/05/18 0858 215 lb (97.5 kg)     Height 05/05/18 0858 5\' 11"  (1.803 m)     Head Circumference --      Peak Flow --      Pain Score 05/05/18 0858 7     Pain Loc --      Pain Edu? --      Excl. in GC? --    Constitutional: Alert and oriented. Well appearing and in no acute distress. Neck: No stridor.  Hematological/Lymphatic/Immunilogical: No cervical lymphadenopathy. Cardiovascular: Normal rate, regular rhythm. Grossly normal heart sounds.  Good peripheral circulation. Respiratory: Normal respiratory effort.  No retractions. Lungs CTAB. Musculoskeletal: No obvious spinal deformity.  Bilateral straight leg test in the supine position.   Neurologic:  Normal speech and language. No gross focal neurologic deficits are appreciated. No gait instability. Skin:  Skin is warm, dry and intact. No rash noted. Psychiatric: Mood and affect are normal. Speech and behavior are normal.  ____________________________________________   LABS (all labs ordered are listed, but only abnormal results are displayed)  Labs Reviewed - No data to display ____________________________________________  EKG   ____________________________________________  RADIOLOGY  ED MD interpretation:    Official radiology report(s): Dg Lumbar Spine 2-3 Views  Result Date: 05/05/2018 CLINICAL DATA:  Injured low back lifting at work, pain radiating down both legs EXAM: LUMBAR SPINE - 2-3 VIEW COMPARISON:  None. FINDINGS: The lumbar vertebrae are in normal alignment. Intervertebral disc spaces appear normal. No compression deformity is seen. The SI joints are unremarkable. The facet joints  appear normal. The bowel gas pattern is nonspecific. IMPRESSION: Normal alignment. Normal intervertebral disc spaces. No acute abnormality. Electronically Signed   By: Dwyane DeePaul  Barry M.D.   On: 05/05/2018 10:00   Ct Lumbar Spine Wo Contrast  Result Date: 05/05/2018 CLINICAL DATA:  42 year old male status post back injury two days ago from heavy lifting. No symptom relief thus far. EXAM: CT LUMBAR SPINE WITHOUT CONTRAST TECHNIQUE: Multidetector CT imaging of the lumbar spine was performed without intravenous contrast administration. Multiplanar CT image reconstructions were also generated. COMPARISON:  Lumbar radiographs 05/05/2018. FINDINGS: Segmentation: Normal. Alignment: Stable from the earlier radiographs. Mild straightening of lordosis. No spondylolisthesis. Vertebrae: Intact. No acute osseous abnormality identified. Intact visible sacrum and SI joints. Paraspinal and other soft tissues: Negative visible noncontrast abdominal and pelvic viscera. Negative visualized posterior paraspinal soft tissues. 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 The following superimposed degenerative changes are noted: T11-T12: Negative. T12-L1:  Negative. L1-L2:  Negative. L2-L3:  Negative. L3-L4:  Negative. L4-L5:  Negative. L5-S1: Broad-based leftward disc herniation best seen on  series 9, image 123. this abuts the descending S1 nerve roots in the lateral recesses, more so the left. No significant spinal stenosis results. No foraminal stenosis is evident. IMPRESSION: 1. Leftward disc herniation at L5-S1 affects the descending S1 nerve roots, more so the left. No spinal stenosis results. 2. 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 3. Otherwise negative. Electronically Signed   By: Odessa Fleming M.D.   On: 05/05/2018 10:26    ____________________________________________   PROCEDURES  Procedure(s) performed:  None  Procedures  Critical Care performed: No  ____________________________________________   INITIAL IMPRESSION / ASSESSMENT AND PLAN / ED COURSE  As part of my medical decision making, I reviewed the following data within the electronic MEDICAL RECORD NUMBER    Radicular back pain secondary to lifting incident.  Differential consist of herniated disc, mild cauda equina, acute lumbar strain. Discussed unremarkable lumbar spine x-ray.  CT scan revealed herniated disk at L5-S1.  Patient advised to continue previous medication.  Patient given prescription for Lidoderm patch.  Patient advised to contact neurosurgery for definitive evaluation and treatment.     ____________________________________________   FINAL CLINICAL IMPRESSION(S) / ED DIAGNOSES  Final diagnoses:  Lumbar herniated disc     ED Discharge Orders        Ordered    Lidocaine (ZTLIDO) 1.8 % PTCH  2 times daily     05/05/18 1046       Note:  This document was prepared using Dragon voice recognition software and may include unintentional dictation errors.    Joni Reining, PA-C 05/05/18 1052    Sharyn Creamer, MD 05/14/18 [phone]

## 2018-05-05 NOTE — ED Notes (Addendum)
See triage note  Brought [facility] with lower [facility] pain  States he had a w/c injury on Tuesday. Developed pain after lifting something heavy at work   Was seen and placed on prednisone and muscle relaxers at that time  States pian is worse and is going into both legs

## 2018-05-05 NOTE — ED Triage Notes (Signed)
[facility] for [facility] that occurred on Tuesday, states he is not having any relief from there initial treatment., pt sates he was lifting a 120lb object on a table and is having medial mower back pain.

## 2018-07-05 DIAGNOSIS — M5416 Radiculopathy, lumbar region: Secondary | ICD-10-CM | POA: Insufficient documentation

## 2018-12-06 ENCOUNTER — Encounter: Payer: Managed Care, Other (non HMO) | Admitting: Family Medicine

## 2019-02-11 ENCOUNTER — Emergency Department: Payer: Self-pay

## 2019-02-11 ENCOUNTER — Other Ambulatory Visit: Payer: Self-pay

## 2019-02-11 ENCOUNTER — Emergency Department
Admission: EM | Admit: 2019-02-11 | Discharge: 2019-02-11 | Disposition: A | Discharge status: Home / Self Care | Payer: Self-pay | Attending: Emergency Medicine | Admitting: Emergency Medicine

## 2019-02-11 DIAGNOSIS — K5790 Diverticulosis of intestine, part unspecified, without perforation or abscess without bleeding: Secondary | ICD-10-CM | POA: Insufficient documentation

## 2019-02-11 DIAGNOSIS — K5792 Diverticulitis of intestine, part unspecified, without perforation or abscess without bleeding: Secondary | ICD-10-CM

## 2019-02-11 DIAGNOSIS — J45909 Unspecified asthma, uncomplicated: Secondary | ICD-10-CM | POA: Insufficient documentation

## 2019-02-11 DIAGNOSIS — Z87891 Personal history of nicotine dependence: Secondary | ICD-10-CM | POA: Insufficient documentation

## 2019-02-11 DIAGNOSIS — Z79899 Other long term (current) drug therapy: Secondary | ICD-10-CM | POA: Insufficient documentation

## 2019-02-11 LAB — URINALYSIS, COMPLETE (UACMP) WITH MICROSCOPIC
Bacteria, UA: NONE SEEN
Bilirubin Urine: NEGATIVE
Glucose, UA: NEGATIVE mg/dL
Hgb urine dipstick: NEGATIVE
Ketones, ur: NEGATIVE mg/dL
Leukocytes,Ua: NEGATIVE
Nitrite: NEGATIVE
Protein, ur: 30 mg/dL — AB
Specific Gravity, Urine: 1.025 (ref 1.005–1.030)
Squamous Epithelial / HPF: NONE SEEN (ref 0–5)
pH: 7 (ref 5.0–8.0)

## 2019-02-11 LAB — COMPREHENSIVE METABOLIC PANEL
ALT: 26 U/L (ref 0–44)
AST: 20 U/L (ref 15–41)
Albumin: 4.9 g/dL (ref 3.5–5.0)
Alkaline Phosphatase: 97 U/L (ref 38–126)
Anion gap: 10 (ref 5–15)
BUN: 16 mg/dL (ref 6–20)
CO2: 26 mmol/L (ref 22–32)
Calcium: 9.5 mg/dL (ref 8.9–10.3)
Chloride: 103 mmol/L (ref 98–111)
Creatinine, Ser: 1 mg/dL (ref 0.61–1.24)
GFR calc Af Amer: >60 mL/min (ref >60)
GFR calc non Af Amer: >60 mL/min (ref >60)
Glucose, Bld: 103 mg/dL — ABNORMAL HIGH (ref 70–99)
Potassium: 4 mmol/L (ref 3.5–5.1)
Sodium: 139 mmol/L (ref 135–145)
Total Bilirubin: 1.4 mg/dL — ABNORMAL HIGH (ref 0.3–1.2)
Total Protein: 8 g/dL (ref 6.5–8.1)

## 2019-02-11 LAB — CBC
HCT: 44.4 % (ref 39.0–52.0)
Hemoglobin: 14.8 g/dL (ref 13.0–17.0)
MCH: 28.2 pg (ref 26.0–34.0)
MCHC: 33.3 g/dL (ref 30.0–36.0)
MCV: 84.6 fL (ref 80.0–100.0)
Platelets: 251 10*3/uL (ref 150–400)
RBC: 5.25 MIL/uL (ref 4.22–5.81)
RDW: 12.9 % (ref 11.5–15.5)
WBC: 13.6 10*3/uL — ABNORMAL HIGH (ref 4.0–10.5)
nRBC: 0 % (ref 0.0–0.2)

## 2019-02-11 LAB — LIPASE, BLOOD: Lipase: 31 U/L (ref 11–51)

## 2019-02-11 MED ORDER — SODIUM CHLORIDE 0.9 % IV BOLUS
500.0000 mL | Freq: Once | INTRAVENOUS | Status: AC
  Administered 2019-02-11: 13:00:00 500 mL via INTRAVENOUS

## 2019-02-11 MED ORDER — KETOROLAC TROMETHAMINE 30 MG/ML IJ SOLN
30.0000 mg | Freq: Once | INTRAMUSCULAR | Status: AC
  Administered 2019-02-11: 30 mg via INTRAVENOUS
  Filled 2019-02-11: qty 1

## 2019-02-11 MED ORDER — IOHEXOL 300 MG/ML  SOLN
100.0000 mL | Freq: Once | INTRAMUSCULAR | Status: AC | PRN
  Administered 2019-02-11: 100 mL via INTRAVENOUS

## 2019-02-11 MED ORDER — IOHEXOL 240 MG/ML SOLN
50.0000 mL | Freq: Once | INTRAMUSCULAR | Status: AC
  Administered 2019-02-11: 50 mL via ORAL

## 2019-02-11 MED ORDER — METRONIDAZOLE 500 MG PO TABS
500.0000 mg | ORAL_TABLET | Freq: Once | ORAL | Status: AC
  Administered 2019-02-11: 500 mg via ORAL
  Filled 2019-02-11: qty 1

## 2019-02-11 MED ORDER — CIPROFLOXACIN HCL 500 MG PO TABS
500.0000 mg | ORAL_TABLET | Freq: Once | ORAL | Status: AC
  Administered 2019-02-11: 500 mg via ORAL
  Filled 2019-02-11: qty 1

## 2019-02-11 MED ORDER — CIPROFLOXACIN HCL 500 MG PO TABS: 500.0000 mg | ORAL_TABLET | Freq: Two times a day (BID) | ORAL | 0 refills | Status: DC

## 2019-02-11 MED ORDER — ONDANSETRON HCL 4 MG/2ML IJ SOLN
4.0000 mg | Freq: Once | INTRAMUSCULAR | Status: AC
  Administered 2019-02-11: 4 mg via INTRAVENOUS
  Filled 2019-02-11: qty 2

## 2019-02-11 MED ORDER — METRONIDAZOLE 500 MG PO TABS: 500.0000 mg | ORAL_TABLET | Freq: Three times a day (TID) | ORAL | 0 refills | Status: DC

## 2019-02-11 NOTE — Discharge Instructions (Signed)
We believe your symptoms are caused by diverticulitis.  Most of the time this condition (please read through the included information) can be cured with outpatient antibiotics.  Please take the full course of prescribed medication(s) and follow up with the doctors recommended above.  Return to the ED if your abdominal pain worsens or fails to improve, you develop bloody vomiting, bloody diarrhea, you are unable to tolerate fluids due to vomiting, fever greater than 101, or other symptoms that concern you.    

## 2019-02-11 NOTE — ED Triage Notes (Signed)
Yesterday AM started having low abd pain. Sent from [facility] today. States urinary frequency. State temp at [facility] was 99.4 A&O, ambulatory. Some nausea but no vomiting.

## 2019-02-11 NOTE — ED Notes (Signed)
Pt verbalized understanding of discharge instructions. NAD at this time. 

## 2019-02-11 NOTE — ED Provider Notes (Signed)
[facility] Emergency Department Provider Note   ____________________________________________   First MD Initiated Contact with Patient 02/11/19 1125     (approximate)  I have reviewed the triage vital signs and the nursing notes.   HISTORY  Chief Complaint Abdominal Pain    HPI Derek Ramirez is a 43 y.o. male here for evaluation for abdominal pain  Patient having abdominal pain is lower abdomen yesterday.  He had decreased appetite generally feeling he wanted to eat.  Pain steadily increased, is very low in his abdomen not really sure it is more on the right or left has had a few loose stools with this as well.   Notes this felt like a bit of an urgency to urinate at times with this.  Did not really notice a fever at home but did get told he had a low-grade temperature [facility].  They referred him here  Pain is moderate located primarily in the lower abdomen.  Some nausea and decreased appetite but no vomiting.  No chest pain or trouble breathing.  No travel history.  Past Medical History:  Diagnosis Date  . History of chicken pox   . History of IBS   . History of measles   . Panic disorder     Patient Active Problem List   Diagnosis Date Noted  . Allergic rhinitis 10/31/2015  . Anxiety 10/31/2015  . Chest pain 10/31/2015  . GERD (gastroesophageal reflux disease) 10/31/2015  . Heartburn 10/31/2015  . Palpitations 10/31/2015  . Panic disorder 10/31/2015  . 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 Headache, tension-type 03/01/2008  . Tobacco use 03/17/2006  . 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 BACK SURGERY      Prior to Admission medications   Medication Sig Start Date End Date Taking? Authorizing Provider  ALPRAZolam (XANAX) 0.25 MG tablet Take 1 tablet (0.25 mg total) by mouth every 6 (six) hours as needed. 03/11/18   Malva LimesFisher, Donald E, MD  ciprofloxacin  (CIPRO) 500 MG tablet Take 1 tablet (500 mg total) by mouth 2 (two) times daily. 02/11/19   Sharyn CreamerQuale, , MD  HYDROcodone-acetaminophen (NORCO/VICODIN) 5-325 MG tablet Take 1 tablet by mouth every 6 (six) hours as needed for moderate pain.    [provider]  ibuprofen (ADVIL,MOTRIN) 600 MG tablet Take 1 tablet (600 mg total) by mouth every 6 (six) hours as needed. 07/27/17   Joy, Shawn C, PA-C  Lidocaine (ZTLIDO) 1.8 % PTCH Apply 1 application topically 2 (two) times daily. 05/05/18   Joni ReiningSmith, Ronald K, PA-C  metaxalone (SKELAXIN) 800 MG tablet Take 800 mg by mouth 3 (three) times daily.    [provider]  metroNIDAZOLE (FLAGYL) 500 MG tablet Take 1 tablet (500 mg total) by mouth 3 (three) times daily. 02/11/19   Sharyn CreamerQuale, , MD  pantoprazole (PROTONIX) 20 MG tablet TAKE 1 TABLET BY MOUTH DAILY 04/25/18   Malva LimesFisher, Donald E, MD  predniSONE (DELTASONE) 20 MG tablet Take 40 mg by mouth daily with breakfast.    [provider]  sertraline (ZOLOFT) 50 MG tablet Take 1 tablet (50 mg total) by mouth daily. 04/11/18   Malva LimesFisher, Donald E, MD    Allergies Penicillins and Shellfish allergy  Family History  Problem Relation Age of Onset  . Diabetes Maternal Grandmother   . Diabetes Maternal Grandfather   . Lung cancer Paternal Grandmother        Due to  Asbestos  . Lung cancer Paternal Grandfather        due to Asbestos  . Heart Problems Paternal Grandfather     Social History Social History   Tobacco Use  . Smoking status: Former Smoker    Packs/day: 2.00    Years: 16.00    Pack years: 32.00    Types: Cigarettes    Last attempt to quit: 10/19/2010    Years since quitting: 8.3  . Smokeless tobacco: Never Used  Substance Use Topics  . Alcohol use: No    Alcohol/week: 0.0 standard drinks  . Drug use: No    Review of Systems Constitutional: No fever/chills Eyes: No visual changes. ENT: No sore throat. Cardiovascular: Denies chest pain. Respiratory: Denies shortness of  breath. Gastrointestinal: See HPI Genitourinary: Slight sense of urgency Musculoskeletal: Negative for back pain. Skin: Negative for rash. Neurological: Negative for headaches, areas of focal weakness or numbness.    ____________________________________________   PHYSICAL EXAM:  VITAL SIGNS: ED Triage Vitals  Enc Vitals Group     BP 02/11/19 1110 127/84     Pulse Rate 02/11/19 1110 98     Resp --      Temp 02/11/19 1110 98 F (36.7 C)     Temp Source 02/11/19 1110 Oral     SpO2 02/11/19 1110 98 %     Weight 02/11/19 1111 232 lb (105.2 kg)     Height 02/11/19 1111  (1.803 m)     Head Circumference --      Peak Flow --      Pain Score 02/11/19 1116 4     Pain Loc --      Pain Edu? --      Excl. in GC? --     Constitutional: Alert and oriented. Well appearing and in no acute distress. Eyes: Conjunctivae are normal. Head: Atraumatic. Nose: No congestion/rhinnorhea. Mouth/Throat: Mucous membranes are moist. Neck: No stridor.  Cardiovascular: Normal rate, regular rhythm. Grossly normal heart sounds.  Good peripheral circulation. Respiratory: Normal respiratory effort.  No retractions. Lungs CTAB. Gastrointestinal: Soft and nontender except in the lower quadrants where he is mild right lower quadrant tenderness but significant to moderate left lower quadrant tenderness without rebound or obvious peritonitis. No distention.  Circumcised penis.  Close nontender bilateral.  No erythema or rash in the genitourinary region Musculoskeletal: No lower extremity tenderness nor edema. Neurologic:  Normal speech and language. No gross focal neurologic deficits are appreciated.  Skin:  Skin is warm, dry and intact. No rash noted. Psychiatric: Mood and affect are normal. Speech and behavior are normal.  ____________________________________________   LABS (all labs ordered are listed, but only abnormal results are displayed)  Labs Reviewed  COMPREHENSIVE METABOLIC PANEL -  Abnormal; Notable for the following components:      Result Value   Glucose, Bld 103 (*)    Total Bilirubin 1.4 (*)    All other components within normal limits  CBC - Abnormal; Notable for the following components:   WBC 13.6 (*)    All other components within normal limits  URINALYSIS, COMPLETE (UACMP) WITH MICROSCOPIC - Abnormal; Notable for the following components:   Color, Urine YELLOW (*)    APPearance CLEAR (*)    Protein, ur 30 (*)    All other components within normal limits  LIPASE, BLOOD   ____________________________________________  EKG   ____________________________________________  RADIOLOGY  Ct Abdomen Pelvis W Contrast  Result Date: 02/11/2019 CLINICAL DATA:  Lower abdominal pain EXAM:  CT ABDOMEN AND PELVIS WITH CONTRAST TECHNIQUE: Multidetector CT imaging of the abdomen and pelvis was performed using the standard protocol following bolus administration of intravenous contrast. CONTRAST:  OMNIPAQUE IOHEXOL 300 MG/ML  SOLN COMPARISON:  None. FINDINGS: Lower chest: No acute abnormality. Hepatobiliary: Unremarkable Pancreas: Unremarkable Spleen: Unremarkable Adrenals/Urinary Tract: Kidneys and adrenal glands are unremarkable. Bladder is decompressed. Stomach/Bowel: There is wall thickening involving the sigmoid colon with stranding in the adjacent fat. There is a diverticulum on image 76 of series 2 which is significantly mid flamed with ill-defined walls and stranding in the adjacent fat. There is no evidence of extraluminal bowel gas or abscess formation. Normal appendix. Small bowel is decompressed. Hiatal hernia is noted. Vascular/Lymphatic: No evidence of aortic aneurysm. There is no abnormal adenopathy by CT short axis diameter measurement criteria. Small scattered para-aortic lymph nodes with fatty stranding are noted. These findings likely reflect an inflammatory process. Reproductive: Prostate is borderline enlarged. Other: There is no free fluid.  Musculoskeletal: No vertebral compression deformity. IMPRESSION: Findings are consistent with acute sigmoid diverticulitis. No evidence of bowel perforation or abscess formation. Electronically Signed   By: Jolaine Click M.D.   On: 02/11/2019 13:46   CT reviewed, acute sigmoid diverticulitis ____________________________________________   PROCEDURES  Procedure(s) performed: None  Procedures  Critical Care performed: No  ____________________________________________   INITIAL IMPRESSION / ASSESSMENT AND PLAN / ED COURSE  Pertinent labs & imaging results that were available during my care of the patient were reviewed by me and considered in my medical decision making (see chart for details).   Differential diagnosis includes but is not limited to, abdominal perforation, aortic dissection, cholecystitis, appendicitis, diverticulitis, colitis, esophagitis/gastritis, kidney stone, pyelonephritis, urinary tract infection, aortic aneurysm. All are considered in decision and treatment plan. Based upon the patient's presentation and risk factors, with his presentation of left lower quadrant tenderness and symptomatology I am suspicious this could represent diverticulitis less likely an unusual appendicitis or other acute intra-abdominal etiology.  Upon review of his labs, also a CT scan his presentation appears most consistent with acute diverticulitis without evidence of acute complication or perforation.  He is doing well, not having any vomiting, normal vital signs, pain well controlled with nonsteroidals.  Discussed with the patient and he does have an unknown penicillin allergy but reports it caused some type of rash and is not sure if he can take penicillins without reaction.  For this reason I discussed risks and benefits of use of Flagyl and ciprofloxacin as long as well as common reactions and joint problems that can be associated especially with the ciprofloxacin.  Patient agreeable  Return  precautions and treatment recommendations and follow-up discussed with the patient who is agreeable with the plan.        ____________________________________________   FINAL CLINICAL IMPRESSION(S) / ED DIAGNOSES  Final diagnoses:  Diverticulitis        Note:  This document was prepared using Dragon voice recognition software and may include unintentional dictation errors       Sharyn Creamer, MD 02/11/19 2105

## 2019-03-04 ENCOUNTER — Other Ambulatory Visit: Payer: Self-pay | Admitting: Family Medicine

## 2019-03-04 DIAGNOSIS — K219 Gastro-esophageal reflux disease without esophagitis: Secondary | ICD-10-CM

## 2019-11-17 ENCOUNTER — Encounter: Payer: Self-pay | Admitting: Family Medicine

## 2020-01-31 ENCOUNTER — Encounter: Payer: Self-pay | Admitting: Family Medicine

## 2020-02-21 ENCOUNTER — Emergency Department
Admission: EM | Admit: 2020-02-21 | Discharge: 2020-02-21 | Disposition: A | Discharge status: Home / Self Care | Payer: Self-pay | Attending: Emergency Medicine | Admitting: Emergency Medicine

## 2020-02-21 ENCOUNTER — Other Ambulatory Visit: Payer: Self-pay

## 2020-02-21 DIAGNOSIS — N4889 Other specified disorders of penis: Secondary | ICD-10-CM | POA: Insufficient documentation

## 2020-02-21 DIAGNOSIS — R102 Pelvic and perineal pain: Secondary | ICD-10-CM | POA: Insufficient documentation

## 2020-02-21 DIAGNOSIS — Z5321 Procedure and treatment not carried out due to patient leaving prior to being seen by health care provider: Secondary | ICD-10-CM | POA: Insufficient documentation

## 2020-02-21 LAB — CBC
HCT: 41.1 % (ref 39.0–52.0)
Hemoglobin: 14.4 g/dL (ref 13.0–17.0)
MCH: 28.9 pg (ref 26.0–34.0)
MCHC: 35 g/dL (ref 30.0–36.0)
MCV: 82.4 fL (ref 80.0–100.0)
Platelets: 288 10*3/uL (ref 150–400)
RBC: 4.99 MIL/uL (ref 4.22–5.81)
RDW: 12.7 % (ref 11.5–15.5)
WBC: 11.2 10*3/uL — ABNORMAL HIGH (ref 4.0–10.5)
nRBC: 0 % (ref 0.0–0.2)

## 2020-02-21 LAB — URINALYSIS, COMPLETE (UACMP) WITH MICROSCOPIC
Bacteria, UA: NONE SEEN
Bilirubin Urine: NEGATIVE
Glucose, UA: NEGATIVE mg/dL
Hgb urine dipstick: NEGATIVE
Ketones, ur: NEGATIVE mg/dL
Leukocytes,Ua: NEGATIVE
Nitrite: NEGATIVE
Protein, ur: NEGATIVE mg/dL
Specific Gravity, Urine: 1.027 (ref 1.005–1.030)
Squamous Epithelial / HPF: NONE SEEN (ref 0–5)
WBC, UA: NONE SEEN WBC/hpf (ref 0–5)
pH: 6 (ref 5.0–8.0)

## 2020-02-21 LAB — COMPREHENSIVE METABOLIC PANEL
ALT: 27 U/L (ref 0–44)
AST: 22 U/L (ref 15–41)
Albumin: 4.3 g/dL (ref 3.5–5.0)
Alkaline Phosphatase: 75 U/L (ref 38–126)
Anion gap: 7 (ref 5–15)
BUN: 19 mg/dL (ref 6–20)
CO2: 22 mmol/L (ref 22–32)
Calcium: 8.9 mg/dL (ref 8.9–10.3)
Chloride: 107 mmol/L (ref 98–111)
Creatinine, Ser: 1.05 mg/dL (ref 0.61–1.24)
GFR calc Af Amer: >60 mL/min (ref >60)
GFR calc non Af Amer: >60 mL/min (ref >60)
Glucose, Bld: 142 mg/dL — ABNORMAL HIGH (ref 70–99)
Potassium: 3.6 mmol/L (ref 3.5–5.1)
Sodium: 136 mmol/L (ref 135–145)
Total Bilirubin: 0.7 mg/dL (ref 0.3–1.2)
Total Protein: 7.2 g/dL (ref 6.5–8.1)

## 2020-02-21 NOTE — ED Triage Notes (Signed)
Pt in with co suprapubic pain that started 2 hrs ago along with pain in the tip of his penis. States pain to tip of penis is constant but worse when tries to void, does have urgency denies any fever.

## 2020-02-22 ENCOUNTER — Emergency Department
Admission: EM | Admit: 2020-02-22 | Discharge: 2020-02-22 | Disposition: A | Discharge status: Home / Self Care | Payer: No Typology Code available for payment source | Attending: Emergency Medicine | Admitting: Emergency Medicine

## 2020-02-22 ENCOUNTER — Emergency Department: Payer: No Typology Code available for payment source

## 2020-02-22 ENCOUNTER — Encounter: Payer: Self-pay | Admitting: Emergency Medicine

## 2020-02-22 ENCOUNTER — Other Ambulatory Visit: Payer: Self-pay

## 2020-02-22 DIAGNOSIS — Z87891 Personal history of nicotine dependence: Secondary | ICD-10-CM | POA: Diagnosis not present

## 2020-02-22 DIAGNOSIS — R1031 Right lower quadrant pain: Secondary | ICD-10-CM | POA: Diagnosis present

## 2020-02-22 DIAGNOSIS — K5792 Diverticulitis of intestine, part unspecified, without perforation or abscess without bleeding: Secondary | ICD-10-CM | POA: Diagnosis not present

## 2020-02-22 DIAGNOSIS — Z79899 Other long term (current) drug therapy: Secondary | ICD-10-CM | POA: Diagnosis not present

## 2020-02-22 LAB — CBC WITH DIFFERENTIAL/PLATELET
Abs Immature Granulocytes: 0.05 10*3/uL (ref 0.00–0.07)
Basophils Absolute: 0.1 10*3/uL (ref 0.0–0.1)
Basophils Relative: 0 %
Eosinophils Absolute: 0.1 10*3/uL (ref 0.0–0.5)
Eosinophils Relative: 0 %
HCT: 44.7 % (ref 39.0–52.0)
Hemoglobin: 15.2 g/dL (ref 13.0–17.0)
Immature Granulocytes: 0 %
Lymphocytes Relative: 11 %
Lymphs Abs: 1.5 10*3/uL (ref 0.7–4.0)
MCH: 28.1 pg (ref 26.0–34.0)
MCHC: 34 g/dL (ref 30.0–36.0)
MCV: 82.8 fL (ref 80.0–100.0)
Monocytes Absolute: 1.2 10*3/uL — ABNORMAL HIGH (ref 0.1–1.0)
Monocytes Relative: 9 %
Neutro Abs: 10.5 10*3/uL — ABNORMAL HIGH (ref 1.7–7.7)
Neutrophils Relative %: 80 %
Platelets: 298 10*3/uL (ref 150–400)
RBC: 5.4 MIL/uL (ref 4.22–5.81)
RDW: 12.8 % (ref 11.5–15.5)
WBC: 13.3 10*3/uL — ABNORMAL HIGH (ref 4.0–10.5)
nRBC: 0 % (ref 0.0–0.2)

## 2020-02-22 LAB — COMPREHENSIVE METABOLIC PANEL
ALT: 27 U/L (ref 0–44)
AST: 18 U/L (ref 15–41)
Albumin: 4.8 g/dL (ref 3.5–5.0)
Alkaline Phosphatase: 78 U/L (ref 38–126)
Anion gap: 7 (ref 5–15)
BUN: 14 mg/dL (ref 6–20)
CO2: 27 mmol/L (ref 22–32)
Calcium: 9.4 mg/dL (ref 8.9–10.3)
Chloride: 103 mmol/L (ref 98–111)
Creatinine, Ser: 1.11 mg/dL (ref 0.61–1.24)
GFR calc Af Amer: >60 mL/min (ref >60)
GFR calc non Af Amer: >60 mL/min (ref >60)
Glucose, Bld: 109 mg/dL — ABNORMAL HIGH (ref 70–99)
Potassium: 3.8 mmol/L (ref 3.5–5.1)
Sodium: 137 mmol/L (ref 135–145)
Total Bilirubin: 1.3 mg/dL — ABNORMAL HIGH (ref 0.3–1.2)
Total Protein: 8.2 g/dL — ABNORMAL HIGH (ref 6.5–8.1)

## 2020-02-22 LAB — LIPASE, BLOOD: Lipase: 33 U/L (ref 11–51)

## 2020-02-22 MED ORDER — SULFAMETHOXAZOLE-TRIMETHOPRIM 800-160 MG PO TABS
1.0000 | ORAL_TABLET | Freq: Once | ORAL | Status: AC
  Administered 2020-02-22: 11:00:00 1 via ORAL
  Filled 2020-02-22: qty 1

## 2020-02-22 MED ORDER — METRONIDAZOLE 500 MG PO TABS
500.0000 mg | ORAL_TABLET | Freq: Once | ORAL | Status: AC
  Administered 2020-02-22: 11:00:00 500 mg via ORAL
  Filled 2020-02-22: qty 1

## 2020-02-22 MED ORDER — IOHEXOL 300 MG/ML  SOLN
100.0000 mL | Freq: Once | INTRAMUSCULAR | Status: AC | PRN
  Administered 2020-02-22: 09:00:00 100 mL via INTRAVENOUS

## 2020-02-22 MED ORDER — METRONIDAZOLE 500 MG PO TABS: 500.0000 mg | ORAL_TABLET | Freq: Three times a day (TID) | ORAL | 0 refills | Status: DC

## 2020-02-22 MED ORDER — METRONIDAZOLE 500 MG PO TABS: 500.0000 mg | ORAL_TABLET | Freq: Three times a day (TID) | ORAL | 0 refills | Status: AC

## 2020-02-22 MED ORDER — ONDANSETRON HCL 4 MG/2ML IJ SOLN
4.0000 mg | Freq: Once | INTRAMUSCULAR | Status: AC
  Administered 2020-02-22: 09:00:00 4 mg via INTRAVENOUS
  Filled 2020-02-22: qty 2

## 2020-02-22 MED ORDER — SULFAMETHOXAZOLE-TRIMETHOPRIM 800-160 MG PO TABS: 1.0000 | ORAL_TABLET | Freq: Two times a day (BID) | ORAL | 0 refills | Status: AC

## 2020-02-22 MED ORDER — MORPHINE SULFATE (PF) 4 MG/ML IV SOLN
4.0000 mg | Freq: Once | INTRAVENOUS | Status: AC
  Administered 2020-02-22: 09:00:00 4 mg via INTRAVENOUS
  Filled 2020-02-22: qty 1

## 2020-02-22 MED ORDER — SULFAMETHOXAZOLE-TRIMETHOPRIM 800-160 MG PO TABS: 1.0000 | ORAL_TABLET | Freq: Two times a day (BID) | ORAL | 0 refills | Status: DC

## 2020-02-22 NOTE — ED Notes (Signed)
Pt c/o lower abdominal pain and suprapubic pain that radiates into his groin. Pt c/o worsening abdominal pain when attempting to urinate, states difficulty getting stream started and feels like he doesn't fully empty his bladder. This RN attempted post void residual, per bladder scan no residual in bladder at this time.

## 2020-02-22 NOTE — ED Notes (Signed)
Pt returned from CT. Pt states pain now 4/10. Pt resting in bed, lights dimmed for comfort. Pt call bell within reach. Pt denies further needs.

## 2020-02-22 NOTE — ED Provider Notes (Signed)
[facility] Emergency Department Provider Note   ____________________________________________   First MD Initiated Contact with Patient 02/22/20 0830     (approximate)  I have reviewed the triage vital signs and the nursing notes.   HISTORY  Chief Complaint Abdominal Pain and Nausea    HPI Derek Ramirez is a 44 y.o. male with possible history of diverticulitis, IBS, and panic disorder who presents to the ED complaining of abdominal pain.  Patient reports that since yesterday around 4 PM he has had gradually worsening pain primarily in the left lower quadrant of his abdomen but also affecting his right lower quadrant.  He describes it as sharp and exacerbated by certain movements as well as bearing down when he goes to urinate.  He denies any burning when he urinates but the act of urinating seems to reproduce the pain in his abdomen.  He had one episode of diarrhea yesterday, since then has been passing what appears to be mucus but he denies any bleeding.  He denies any fevers, cough, chest pain, shortness of breath, or vomiting, but has been feeling nauseous.  He has not noticed any swelling in the area of his groin and denies any testicular pain.        Past Medical History:  Diagnosis Date  . History of chicken pox   . History of IBS   . History of measles   . Panic disorder     Patient Active Problem List   Diagnosis Date Noted  . Allergic rhinitis 10/31/2015  . Anxiety 10/31/2015  . Chest pain 10/31/2015  . GERD (gastroesophageal reflux disease) 10/31/2015  . Heartburn 10/31/2015  . Palpitations 10/31/2015  . Panic disorder 10/31/2015  . 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 Headache, tension-type 03/01/2008  . Tobacco use 03/17/2006  . 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 BACK SURGERY      Prior to Admission medications   Medication Sig Start Date End Date Taking?  Authorizing Provider  ALPRAZolam (XANAX) 0.25 MG tablet Take 1 tablet (0.25 mg total) by mouth every 6 (six) hours as needed. 03/11/18   Malva Limes, MD  ciprofloxacin (CIPRO) 500 MG tablet Take 1 tablet (500 mg total) by mouth 2 (two) times daily. 02/11/19   Sharyn Creamer, MD  HYDROcodone-acetaminophen (NORCO/VICODIN) 5-325 MG tablet Take 1 tablet by mouth every 6 (six) hours as needed for moderate pain.    [provider]  ibuprofen (ADVIL,MOTRIN) 600 MG tablet Take 1 tablet (600 mg total) by mouth every 6 (six) hours as needed. 07/27/17   Joy, Shawn C, PA-C  Lidocaine (ZTLIDO) 1.8 % PTCH Apply 1 application topically 2 (two) times daily. 05/05/18   Joni Reining, PA-C  metaxalone (SKELAXIN) 800 MG tablet Take 800 mg by mouth 3 (three) times daily.    [provider]  metroNIDAZOLE (FLAGYL) 500 MG tablet Take 1 tablet (500 mg total) by mouth 3 (three) times daily for 10 days. 02/22/20 03/03/20  Chesley Noon, MD  pantoprazole (PROTONIX) 20 MG tablet TAKE 1 TABLET BY MOUTH DAILY 03/04/19   Malva Limes, MD  predniSONE (DELTASONE) 20 MG tablet Take 40 mg by mouth daily with breakfast.    [provider]  sertraline (ZOLOFT) 50 MG tablet Take 1 tablet (50 mg total) by mouth daily. 04/11/18   Malva Limes, MD  sulfamethoxazole-trimethoprim (BACTRIM DS) 800-160 MG tablet Take 1 tablet by mouth 2 (two)  times daily for 10 days. 02/22/20 03/03/20  Blake Divine, MD    Allergies Penicillins and Shellfish allergy  Family History  Problem Relation Age of Onset  . Diabetes Maternal Grandmother   . Diabetes Maternal Grandfather   . Lung cancer Paternal Grandmother        Due to Asbestos  . Lung cancer Paternal Grandfather        due to Asbestos  . Heart Problems Paternal Grandfather     Social History Social History   Tobacco Use  . Smoking status: Former Smoker    Packs/day: 2.00    Years: 16.00    Pack years: 32.00    Types: Cigarettes    Quit date:  10/19/2010    Years since quitting: 9.3  . Smokeless tobacco: Never Used  Substance Use Topics  . Alcohol use: No    Alcohol/week: 0.0 standard drinks  . Drug use: No    Review of Systems  Constitutional: No fever/chills Eyes: No visual changes. ENT: No sore throat. Cardiovascular: Denies chest pain. Respiratory: Denies shortness of breath. Gastrointestinal: Positive for abdominal pain.  Positive for nausea, no vomiting.  Positive for diarrhea.  No constipation. Genitourinary: Negative for dysuria. Musculoskeletal: Negative for back pain. Skin: Negative for rash. Neurological: Negative for headaches, focal weakness or numbness.  ____________________________________________   PHYSICAL EXAM:  VITAL SIGNS: ED Triage Vitals  Enc Vitals Group     BP 02/22/20 0819 (!) 129/91     Pulse Rate 02/22/20 0819 (!) 106     Resp 02/22/20 0819 14     Temp 02/22/20 0819 98.1 F (36.7 C)     Temp Source 02/22/20 0819 Oral     SpO2 02/22/20 0819 97 %     Weight 02/22/20 0818 240 lb (108.9 kg)     Height 02/22/20 0818 5\' 11"  (1.803 m)     Head Circumference --      Peak Flow --      Pain Score 02/22/20 0817 7     Pain Loc --      Pain Edu? --      Excl. in [place]? --     Constitutional: Alert and oriented. Eyes: Conjunctivae are normal. Head: Atraumatic. Nose: No congestion/rhinnorhea. Mouth/Throat: Mucous membranes are moist. Neck: Normal ROM Cardiovascular: Normal rate, regular rhythm. Grossly normal heart sounds. Respiratory: Normal respiratory effort.  No retractions. Lungs CTAB. Gastrointestinal: Soft and tender to palpation in the bilateral lower quadrants, left greater than right. No distention. Genitourinary: No testicular tenderness noted bilaterally, no inguinal hernias noted bilaterally. Musculoskeletal: No lower extremity tenderness nor edema. Neurologic:  Normal speech and language. No gross focal neurologic deficits are appreciated. Skin:  Skin is warm, dry and intact.  No rash noted. Psychiatric: Mood and affect are normal. Speech and behavior are normal.  ____________________________________________   LABS (all labs ordered are listed, but only abnormal results are displayed)  Labs Reviewed  CBC WITH DIFFERENTIAL/PLATELET - Abnormal; Notable for the following components:      Result Value   WBC 13.3 (*)    Neutro Abs 10.5 (*)    Monocytes Absolute 1.2 (*)    All other components within normal limits  COMPREHENSIVE METABOLIC PANEL - Abnormal; Notable for the following components:   Glucose, Bld 109 (*)    Total Protein 8.2 (*)    Total Bilirubin 1.3 (*)    All other components within normal limits  LIPASE, BLOOD     PROCEDURES  Procedure(s) performed (including Critical Care):  Procedures   ____________________________________________   INITIAL IMPRESSION / ASSESSMENT AND PLAN / ED COURSE       44 year old male with history of diverticulitis, IBS, and panic disorder presents to the ED with increasing bilateral lower quadrant abdominal pain, left greater than right, since yesterday at 4 PM.  Pain is primarily reproduced with palpation of his left lower quadrant and I would be most concerned for diverticulitis.  He does not have any apparent inguinal hernia or testicular tenderness on exam.  He had initially checked into the ED overnight for evaluation, but left prior to being seen.  Urine testing at that time showed no evidence of infection or blood to suggest nephrolithiasis.  We will treat with IV morphine and Zofran, further assess with CT scan.  CT scan consistent with uncomplicated diverticulitis.  Given patient's penicillin allergy, we will treat with Bactrim and Flagyl.  His pain is improved and at this point he is appropriate for discharge home, given recurrent diverticulitis will provide referral to GI.  Patient counseled to return to the ED for new or worsening symptoms, patient goes with plan.       ____________________________________________   FINAL CLINICAL IMPRESSION(S) / ED DIAGNOSES  Final diagnoses:  Diverticulitis     ED Discharge Orders         Ordered    sulfamethoxazole-trimethoprim (BACTRIM DS) 800-160 MG tablet  2 times daily     02/22/20 1127    metroNIDAZOLE (FLAGYL) 500 MG tablet  3 times daily     02/22/20 1127           Note:  This document was prepared using Dragon voice recognition software and may include unintentional dictation errors.   Chesley Noon, MD 02/22/20 1128

## 2020-02-22 NOTE — ED Triage Notes (Signed)
Pt reports pain to lower abd that radiates down into his groin. Pt reports was here last pm but left before being seen. Pt had labs and and urine done last pm.

## 2020-02-22 NOTE — ED Notes (Signed)
Pt transported to CT at this time.

## 2020-02-22 NOTE — ED Notes (Signed)
NAD noted at time of D/C. Pt denies questions or concerns. Pt ambulatory to the lobby at this time.  

## 2020-03-27 ENCOUNTER — Ambulatory Visit (INDEPENDENT_AMBULATORY_CARE_PROVIDER_SITE_OTHER): Payer: No Typology Code available for payment source | Admitting: Physician Assistant

## 2020-03-27 ENCOUNTER — Other Ambulatory Visit: Payer: Self-pay

## 2020-03-27 ENCOUNTER — Encounter: Payer: Self-pay | Admitting: Physician Assistant

## 2020-03-27 VITALS — BP 122/74 | HR 75 | Temp 96.9°F | Wt 239.6 lb

## 2020-03-27 DIAGNOSIS — Z0000 Encounter for general adult medical examination without abnormal findings: Secondary | ICD-10-CM

## 2020-03-27 DIAGNOSIS — R739 Hyperglycemia, unspecified: Secondary | ICD-10-CM | POA: Diagnosis not present

## 2020-03-27 DIAGNOSIS — F419 Anxiety disorder, unspecified: Secondary | ICD-10-CM

## 2020-03-27 DIAGNOSIS — R0602 Shortness of breath: Secondary | ICD-10-CM | POA: Diagnosis not present

## 2020-03-27 DIAGNOSIS — Z23 Encounter for immunization: Secondary | ICD-10-CM | POA: Diagnosis not present

## 2020-03-27 DIAGNOSIS — R29818 Other symptoms and signs involving the nervous system: Secondary | ICD-10-CM

## 2020-03-27 MED ORDER — FLUTICASONE-SALMETEROL 100-50 MCG/DOSE IN AEPB: 1.0000 | INHALATION_SPRAY | Freq: Two times a day (BID) | RESPIRATORY_TRACT | 3 refills | Status: DC

## 2020-03-27 MED ORDER — ALBUTEROL SULFATE HFA 108 (90 BASE) MCG/ACT IN AERS: 2.0000 | INHALATION_SPRAY | Freq: Four times a day (QID) | RESPIRATORY_TRACT | 1 refills | Status: DC | PRN

## 2020-03-27 NOTE — Progress Notes (Signed)
Established patient visit   Patient: Derek Ramirez   DOB: Aug 05, 1976   44 y.o. Male  MRN: [id number] Visit Date: 03/27/2020  Today's healthcare provider: Trinna Post, PA-C   No chief complaint on file. I,Greely Atiyeh M Latoria Dry,acting as a Education administrator for Trinna Post, PA-C.,have documented all relevant documentation on the behalf of Trinna Post, PA-C,as directed by  Trinna Post, PA-C while in the presence of Trinna Post, PA-C.  Subjective    HPI   Patient here for CPE and F/u: no family history of colon or prostate cancer. Due for tetanus shot today. Declines COVID.   Patient reports that his family has noticed him breathing heavy throughout the day. This has been happening for 6 months. Patient states that he has noticed him becoming shortness of breath lately more. Patient denies any wheezing or chest tightness. Around 7-10 years ago he was told he might have asthma. He was advair for a brief time and then albuterol PRN but then his symptoms resolved and so he stopped the inhaler. Smoked 1 pack a day from 19 - 34.   Results of the Epworth flowsheet 03/27/2020  Sitting and reading 3  Watching TV 2  Sitting, inactive in a public place (e.g. a theatre or a meeting) 0  As a passenger in a car for an hour without a break 1  Lying down to rest in the afternoon when circumstances permit 3  Sitting and talking to someone 0  Sitting quietly after a lunch without alcohol 0  In a car, while stopped for a few minutes in traffic 0  Total score 9    Wt Readings from Last 3 Encounters:  03/27/20 239 lb 9.6 oz (108.7 kg)  02/22/20 240 lb (108.9 kg)  02/21/20 240 lb (108.9 kg)   BP Readings from Last 3 Encounters:  03/27/20 122/74  02/22/20 127/81  02/21/20 (!) 156/98        Medications: Outpatient Medications Prior to Visit  Medication Sig  . ALPRAZolam (XANAX) 0.25 MG tablet Take 1 tablet (0.25 mg total) by mouth every 6 (six) hours as needed.  . pantoprazole  (PROTONIX) 20 MG tablet TAKE 1 TABLET BY MOUTH DAILY  . sertraline (ZOLOFT) 50 MG tablet Take 1 tablet (50 mg total) by mouth daily.  . [DISCONTINUED] ciprofloxacin (CIPRO) 500 MG tablet Take 1 tablet (500 mg total) by mouth 2 (two) times daily. (Patient not taking: Reported on 03/27/2020)  . [DISCONTINUED] HYDROcodone-acetaminophen (NORCO/VICODIN) 5-325 MG tablet Take 1 tablet by mouth every 6 (six) hours as needed for moderate pain.  . [DISCONTINUED] ibuprofen (ADVIL,MOTRIN) 600 MG tablet Take 1 tablet (600 mg total) by mouth every 6 (six) hours as needed. (Patient not taking: Reported on 03/27/2020)  . [DISCONTINUED] Lidocaine (ZTLIDO) 1.8 % PTCH Apply 1 application topically 2 (two) times daily. (Patient not taking: Reported on 03/27/2020)  . [DISCONTINUED] metaxalone (SKELAXIN) 800 MG tablet Take 800 mg by mouth 3 (three) times daily.  . [DISCONTINUED] predniSONE (DELTASONE) 20 MG tablet Take 40 mg by mouth daily with breakfast.   No facility-administered medications prior to visit.   Depression and anxiety: zoloft 50 mg daily. Got Xanax Rx when daughter was born 25 weeks early, not filled since 2019.   Review of Systems  Constitutional: Negative.   HENT: Negative.   Respiratory: Positive for shortness of breath. Negative for chest tightness and wheezing.   Neurological: Negative.       Objective    BP  122/74 (BP Location: Right Arm, Patient Position: Sitting, Cuff Size: Normal)   Pulse 75   Temp (!) 96.9 F (36.1 C) (Temporal)   Wt 239 lb 9.6 oz (108.7 kg)   SpO2 99%   BMI 33.42 kg/m    Physical Exam Constitutional:      Appearance: Normal appearance. He is normal weight.  Cardiovascular:     Rate and Rhythm: Normal rate and regular rhythm.     Heart sounds: Normal heart sounds.  Pulmonary:     Effort: Pulmonary effort is normal.     Breath sounds: Normal breath sounds.  Skin:    General: Skin is warm and dry.  Neurological:     General: No focal deficit present.      Mental Status: He is alert and oriented to person, place, and time.  Psychiatric:        Mood and Affect: Mood normal.        Behavior: Behavior normal.       No results found for any visits on 03/27/20.  Assessment & Plan    1. Annual physical exam   2. Shortness of breath  Counseled on possible causes including asthma, weight induced. Trial of inhalers as below, counseled on lifestyle interventions.   - Fluticasone-Salmeterol (ADVAIR) 100-50 MCG/DOSE AEPB; Inhale 1 puff into the lungs 2 (two) times daily.  Dispense: 1 each; Refill: 3 - albuterol (VENTOLIN HFA) 108 (90 Base) MCG/ACT inhaler; Inhale 2 puffs into the lungs every 6 (six) hours as needed for wheezing or shortness of breath.  Dispense: 8 g; Refill: 1  3. Suspected sleep apnea  - Home sleep test  4. Hyperglycemia  - HgB A1c - Lipid Profile  5. Need for Tdap vaccination  - Tdap vaccine greater than or equal to 7yo IM  6. Anxiety  Continue zoloft.     Return in about 1 year (around 03/27/2021) for CPE and f/u .      ITrey Sailors, PA-C, have reviewed all documentation for this visit. The documentation on 03/27/20 for the exam, diagnosis, procedures, and orders are all accurate and complete.    Maryella Shivers  [facility] [phone] (phone) [phone] (fax)  [facility]

## 2020-03-27 NOTE — Patient Instructions (Signed)

## 2020-03-28 LAB — HEMOGLOBIN A1C
Est. average glucose Bld gHb Est-mCnc: 105 mg/dL
Hgb A1c MFr Bld: 5.3 % (ref 4.8–5.6)

## 2020-03-28 LAB — LIPID PANEL
Chol/HDL Ratio: 6.4 ratio — ABNORMAL HIGH (ref 0.0–5.0)
Cholesterol, Total: 262 mg/dL — ABNORMAL HIGH (ref 100–199)
HDL: 41 mg/dL (ref >39)
LDL Chol Calc (NIH): 175 mg/dL — ABNORMAL HIGH (ref 0–99)
Triglycerides: 241 mg/dL — ABNORMAL HIGH (ref 0–149)
VLDL Cholesterol Cal: 46 mg/dL — ABNORMAL HIGH (ref 5–40)

## 2020-04-02 LAB — PULMONARY FUNCTION TEST

## 2020-04-03 ENCOUNTER — Encounter: Payer: Self-pay | Admitting: Family Medicine

## 2020-04-07 ENCOUNTER — Encounter: Payer: Self-pay | Admitting: Physician Assistant

## 2020-04-08 ENCOUNTER — Other Ambulatory Visit: Payer: Self-pay

## 2020-04-08 ENCOUNTER — Inpatient Hospital Stay
Admission: EM | Admit: 2020-04-08 | Discharge: 2020-04-11 | DRG: 392 | Disposition: A | Discharge status: Home / Self Care | Payer: No Typology Code available for payment source | Attending: General Surgery | Admitting: General Surgery

## 2020-04-08 ENCOUNTER — Ambulatory Visit: Payer: Self-pay | Admitting: Physician Assistant

## 2020-04-08 ENCOUNTER — Emergency Department: Payer: No Typology Code available for payment source

## 2020-04-08 DIAGNOSIS — Z7951 Long term (current) use of inhaled steroids: Secondary | ICD-10-CM | POA: Diagnosis not present

## 2020-04-08 DIAGNOSIS — Z20822 Contact with and (suspected) exposure to covid-19: Secondary | ICD-10-CM | POA: Diagnosis present

## 2020-04-08 DIAGNOSIS — J45998 Other asthma: Secondary | ICD-10-CM | POA: Diagnosis present

## 2020-04-08 DIAGNOSIS — Z880 Allergy status to penicillin: Secondary | ICD-10-CM

## 2020-04-08 DIAGNOSIS — Z8619 Personal history of other infectious and parasitic diseases: Secondary | ICD-10-CM | POA: Diagnosis not present

## 2020-04-08 DIAGNOSIS — Z833 Family history of diabetes mellitus: Secondary | ICD-10-CM

## 2020-04-08 DIAGNOSIS — K5720 Diverticulitis of large intestine with perforation and abscess without bleeding: Secondary | ICD-10-CM | POA: Diagnosis not present

## 2020-04-08 DIAGNOSIS — A419 Sepsis, unspecified organism: Secondary | ICD-10-CM

## 2020-04-08 DIAGNOSIS — Z87891 Personal history of nicotine dependence: Secondary | ICD-10-CM | POA: Diagnosis not present

## 2020-04-08 DIAGNOSIS — K631 Perforation of intestine (nontraumatic): Secondary | ICD-10-CM

## 2020-04-08 DIAGNOSIS — F419 Anxiety disorder, unspecified: Secondary | ICD-10-CM | POA: Diagnosis present

## 2020-04-08 DIAGNOSIS — Z79899 Other long term (current) drug therapy: Secondary | ICD-10-CM

## 2020-04-08 DIAGNOSIS — K219 Gastro-esophageal reflux disease without esophagitis: Secondary | ICD-10-CM | POA: Diagnosis present

## 2020-04-08 DIAGNOSIS — Z801 Family history of malignant neoplasm of trachea, bronchus and lung: Secondary | ICD-10-CM | POA: Diagnosis not present

## 2020-04-08 DIAGNOSIS — K5792 Diverticulitis of intestine, part unspecified, without perforation or abscess without bleeding: Secondary | ICD-10-CM | POA: Diagnosis not present

## 2020-04-08 HISTORY — DX: Diverticulitis of intestine, part unspecified, without perforation or abscess without bleeding: K57.92

## 2020-04-08 LAB — COMPREHENSIVE METABOLIC PANEL
ALT: 30 U/L (ref 0–44)
AST: 22 U/L (ref 15–41)
Albumin: 4.7 g/dL (ref 3.5–5.0)
Alkaline Phosphatase: 84 U/L (ref 38–126)
Anion gap: 9 (ref 5–15)
BUN: 16 mg/dL (ref 6–20)
CO2: 24 mmol/L (ref 22–32)
Calcium: 9.8 mg/dL (ref 8.9–10.3)
Chloride: 103 mmol/L (ref 98–111)
Creatinine, Ser: 1.17 mg/dL (ref 0.61–1.24)
GFR calc Af Amer: >60 mL/min (ref >60)
GFR calc non Af Amer: >60 mL/min (ref >60)
Glucose, Bld: 109 mg/dL — ABNORMAL HIGH (ref 70–99)
Potassium: 4.1 mmol/L (ref 3.5–5.1)
Sodium: 136 mmol/L (ref 135–145)
Total Bilirubin: 1.1 mg/dL (ref 0.3–1.2)
Total Protein: 7.6 g/dL (ref 6.5–8.1)

## 2020-04-08 LAB — URINALYSIS, COMPLETE (UACMP) WITH MICROSCOPIC
Bilirubin Urine: NEGATIVE
Glucose, UA: NEGATIVE mg/dL
Hgb urine dipstick: NEGATIVE
Ketones, ur: NEGATIVE mg/dL
Leukocytes,Ua: NEGATIVE
Nitrite: NEGATIVE
Protein, ur: 30 mg/dL — AB
Specific Gravity, Urine: 1.036 — ABNORMAL HIGH (ref 1.005–1.030)
Squamous Epithelial / HPF: NONE SEEN (ref 0–5)
pH: 5 (ref 5.0–8.0)

## 2020-04-08 LAB — CBC
HCT: 45.1 % (ref 39.0–52.0)
Hemoglobin: 16 g/dL (ref 13.0–17.0)
MCH: 28.9 pg (ref 26.0–34.0)
MCHC: 35.5 g/dL (ref 30.0–36.0)
MCV: 81.6 fL (ref 80.0–100.0)
Platelets: 272 10*3/uL (ref 150–400)
RBC: 5.53 MIL/uL (ref 4.22–5.81)
RDW: 12.8 % (ref 11.5–15.5)
WBC: 17.2 10*3/uL — ABNORMAL HIGH (ref 4.0–10.5)
nRBC: 0 % (ref 0.0–0.2)

## 2020-04-08 LAB — LACTIC ACID, PLASMA: Lactic Acid, Venous: 1.8 mmol/L (ref 0.5–1.9)

## 2020-04-08 LAB — SARS CORONAVIRUS 2 BY RT PCR (HOSPITAL ORDER, PERFORMED IN ~~LOC~~ HOSPITAL LAB): SARS Coronavirus 2: NEGATIVE

## 2020-04-08 LAB — LIPASE, BLOOD: Lipase: 35 U/L (ref 11–51)

## 2020-04-08 MED ORDER — CIPROFLOXACIN IN D5W 400 MG/200ML IV SOLN
400.0000 mg | Freq: Two times a day (BID) | INTRAVENOUS | Status: DC
  Administered 2020-04-08 – 2020-04-11 (×6): 400 mg via INTRAVENOUS
  Filled 2020-04-08 (×7): qty 200

## 2020-04-08 MED ORDER — OXYCODONE HCL 5 MG PO TABS
5.0000 mg | ORAL_TABLET | ORAL | Status: DC | PRN
  Administered 2020-04-08: 10 mg via ORAL
  Filled 2020-04-08: qty 2

## 2020-04-08 MED ORDER — ENOXAPARIN SODIUM 40 MG/0.4ML ~~LOC~~ SOLN
40.0000 mg | SUBCUTANEOUS | Status: DC
  Administered 2020-04-08 – 2020-04-10 (×3): 40 mg via SUBCUTANEOUS
  Filled 2020-04-08 (×3): qty 0.4

## 2020-04-08 MED ORDER — ALBUTEROL SULFATE (2.5 MG/3ML) 0.083% IN NEBU: 2.5000 mg | INHALATION_SOLUTION | Freq: Four times a day (QID) | RESPIRATORY_TRACT | Status: DC | PRN

## 2020-04-08 MED ORDER — SERTRALINE HCL 50 MG PO TABS
50.0000 mg | ORAL_TABLET | Freq: Every day | ORAL | Status: DC
  Administered 2020-04-09 – 2020-04-10 (×2): 50 mg via ORAL
  Filled 2020-04-08 (×2): qty 1

## 2020-04-08 MED ORDER — ACETAMINOPHEN 500 MG PO TABS
1000.0000 mg | ORAL_TABLET | Freq: Four times a day (QID) | ORAL | Status: DC
  Administered 2020-04-08 – 2020-04-10 (×7): 1000 mg via ORAL
  Filled 2020-04-08 (×8): qty 2

## 2020-04-08 MED ORDER — HYDROMORPHONE HCL 1 MG/ML IJ SOLN
0.5000 mg | Freq: Once | INTRAMUSCULAR | Status: AC
  Administered 2020-04-08: 0.5 mg via INTRAVENOUS
  Filled 2020-04-08: qty 1

## 2020-04-08 MED ORDER — PANTOPRAZOLE SODIUM 20 MG PO TBEC
20.0000 mg | DELAYED_RELEASE_TABLET | Freq: Every day | ORAL | Status: DC
  Administered 2020-04-09 – 2020-04-10 (×2): 20 mg via ORAL
  Filled 2020-04-08 (×3): qty 1

## 2020-04-08 MED ORDER — IOHEXOL 300 MG/ML  SOLN
100.0000 mL | Freq: Once | INTRAMUSCULAR | Status: AC | PRN
  Administered 2020-04-08: 100 mL via INTRAVENOUS

## 2020-04-08 MED ORDER — SODIUM CHLORIDE 0.9 % IV BOLUS
1000.0000 mL | Freq: Once | INTRAVENOUS | Status: AC
  Administered 2020-04-08: 1000 mL via INTRAVENOUS

## 2020-04-08 MED ORDER — ONDANSETRON HCL 4 MG/2ML IJ SOLN: 4.0000 mg | Freq: Four times a day (QID) | INTRAMUSCULAR | Status: DC | PRN

## 2020-04-08 MED ORDER — LEVOFLOXACIN IN D5W 750 MG/150ML IV SOLN
750.0000 mg | Freq: Once | INTRAVENOUS | Status: DC
  Administered 2020-04-08: 750 mg via INTRAVENOUS
  Filled 2020-04-08: qty 150

## 2020-04-08 MED ORDER — LACTATED RINGERS IV SOLN: INTRAVENOUS | Status: DC

## 2020-04-08 MED ORDER — METRONIDAZOLE IN NACL 5-0.79 MG/ML-% IV SOLN
500.0000 mg | Freq: Once | INTRAVENOUS | Status: DC
  Administered 2020-04-08: 500 mg via INTRAVENOUS
  Filled 2020-04-08: qty 100

## 2020-04-08 MED ORDER — HYDROMORPHONE HCL 1 MG/ML IJ SOLN
0.5000 mg | INTRAMUSCULAR | Status: DC | PRN
  Administered 2020-04-08 – 2020-04-09 (×5): 1 mg via INTRAVENOUS
  Filled 2020-04-08 (×5): qty 1

## 2020-04-08 MED ORDER — METRONIDAZOLE IN NACL 5-0.79 MG/ML-% IV SOLN
500.0000 mg | Freq: Three times a day (TID) | INTRAVENOUS | Status: DC
  Administered 2020-04-08 – 2020-04-11 (×8): 500 mg via INTRAVENOUS
  Filled 2020-04-08 (×11): qty 100

## 2020-04-08 MED ORDER — METOPROLOL TARTRATE 5 MG/5ML IV SOLN: 5.0000 mg | Freq: Four times a day (QID) | INTRAVENOUS | Status: DC | PRN

## 2020-04-08 MED ORDER — VANCOMYCIN HCL IN DEXTROSE 1-5 GM/200ML-% IV SOLN: 1000.0000 mg | Freq: Once | INTRAVENOUS | Status: DC

## 2020-04-08 MED ORDER — ONDANSETRON HCL 4 MG/2ML IJ SOLN
4.0000 mg | Freq: Once | INTRAMUSCULAR | Status: AC
  Administered 2020-04-08: 4 mg via INTRAVENOUS
  Filled 2020-04-08: qty 2

## 2020-04-08 MED ORDER — ALPRAZOLAM 0.25 MG PO TABS: 0.2500 mg | ORAL_TABLET | Freq: Four times a day (QID) | ORAL | Status: DC | PRN

## 2020-04-08 MED ORDER — SODIUM CHLORIDE 0.9% FLUSH
3.0000 mL | Freq: Once | INTRAVENOUS | Status: AC
  Administered 2020-04-08: 3 mL via INTRAVENOUS

## 2020-04-08 MED ORDER — ONDANSETRON 4 MG PO TBDP: 4.0000 mg | ORAL_TABLET | Freq: Four times a day (QID) | ORAL | Status: DC | PRN

## 2020-04-08 MED ORDER — MOMETASONE FURO-FORMOTEROL FUM 100-5 MCG/ACT IN AERO
2.0000 | INHALATION_SPRAY | Freq: Two times a day (BID) | RESPIRATORY_TRACT | Status: DC
  Administered 2020-04-09 – 2020-04-10 (×2): 2 via RESPIRATORY_TRACT
  Filled 2020-04-08 (×3): qty 8.8

## 2020-04-08 NOTE — ED Triage Notes (Signed)
Pt c/o mid to lower abd pain with n/V/D since last night after going out to eat dinner. Pt is in NAD.

## 2020-04-08 NOTE — H&P (Addendum)
[facility] SURGICAL HISTORY & PHYSICAL (cpt [phone])  HISTORY OF PRESENT ILLNESS (HPI):  44 y.o. male presented to [facility] ED today for abdominal pain. Patient reports that around 830 PM last night he had the abrupt onset of suprapubic and LLQ abdominal pain. This was severe and sharp in nature. The pain has been constant since the onset, does not radiate, and is exacerbated with movement and coughing. He endorses associated nausea, diarrhea, and dysuria with the pain. No fever, chills, cough, CP, SOB, or emesis. He has a history of diverticulitis and believes this is his 4th episode in the last year. Most recently he was seen in the ED on 02/2020 for this and sent home on Bactrim. This feels similar to his previous episodes of diverticulitis but much more severe. He has never seen a Development worker, international aid for this. No previous abdominal surgeries. Last colonoscopy was 5-6 years ago at [facility], but I am unable to find the report in Care Everywhere. On arrival in the ED, he was afebrile and normotensive but tachycardic. Laboratory work up revealed leukocytosis to 17K but was otherwise reassuring. CT Abdomen/Pelvis was concerning for perforated sigmoid diverticulitis with scattered pneumoperitoneum.   General surgery is consulted by emergency medicine physician Dr Artis Delay, MD for evaluation and management of complicated diverticulitis.    PAST MEDICAL HISTORY (PMH):  Past Medical History:  Diagnosis Date  . Diverticulitis   . History of chicken pox   . History of IBS   . History of measles   . Panic disorder     Reviewed. Otherwise negative.   PAST SURGICAL HISTORY (PSH):  Past Surgical History:  Procedure Laterality Date  . BACK SURGERY      Reviewed. Otherwise negative.   MEDICATIONS:  Prior to Admission medications   Medication Sig Start Date End Date Taking? Authorizing Provider  albuterol (VENTOLIN HFA) 108 (90 Base) MCG/ACT inhaler Inhale 2 puffs into the lungs every 6 (six)  hours as needed for wheezing or shortness of breath. 03/27/20   Trey Sailors, PA-C  ALPRAZolam Prudy Feeler) 0.25 MG tablet Take 1 tablet (0.25 mg total) by mouth every 6 (six) hours as needed. 03/11/18   Malva Limes, MD  Fluticasone-Salmeterol (ADVAIR) 100-50 MCG/DOSE AEPB Inhale 1 puff into the lungs 2 (two) times daily. 03/27/20   Trey Sailors, PA-C  pantoprazole (PROTONIX) 20 MG tablet TAKE 1 TABLET BY MOUTH DAILY 03/04/19   Malva Limes, MD  sertraline (ZOLOFT) 50 MG tablet Take 1 tablet (50 mg total) by mouth daily. 04/11/18   Malva Limes, MD     ALLERGIES:  Allergies  Allergen Reactions  . Penicillins Anaphylaxis    Has patient had a PCN reaction causing immediate rash, facial/tongue/throat swelling, SOB or lightheadedness with hypotension: Yes Has patient had a PCN reaction causing severe rash involving mucus membranes or skin necrosis: No Has patient had a PCN reaction that required hospitalization  Patient not sure Has patient had a PCN reaction occurring within the last 10 years: No If all of the above answers are "NO", then may proceed with Cephalosporin use.  . Shellfish Allergy     Throat swells and cant breath     SOCIAL HISTORY:  Social History   Socioeconomic History  . Marital status: Married    Spouse name: Not on file  . Number of children: Not on file  . Years of education: Not on file  . Highest education level: Not on file  Occupational History  .  Not on file  Tobacco Use  . Smoking status: Former Smoker    Packs/day: 2.00    Years: 16.00    Pack years: 32.00    Types: Cigarettes    Quit date: 10/19/2010    Years since quitting: 9.4  . Smokeless tobacco: Never Used  Substance and Sexual Activity  . Alcohol use: No    Alcohol/week: 0.0 standard drinks  . Drug use: No  . Sexual activity: Not on file  Other Topics Concern  . Not on file  Social History Narrative  . Not on file   Social Determinants of Health   Financial Resource Strain:    . Difficulty of Paying Living Expenses:   Food Insecurity:   . Worried About Programme researcher, broadcasting/film/video in the Last Year:   . Barista in the Last Year:   Transportation Needs:   . Freight forwarder (Medical):   Marland Kitchen Lack of Transportation (Non-Medical):   Physical Activity:   . Days of Exercise per Week:   . Minutes of Exercise per Session:   Stress:   . Feeling of Stress :   Social Connections:   . Frequency of Communication with Friends and Family:   . Frequency of Social Gatherings with Friends and Family:   . Attends Religious Services:   . Active Member of Clubs or Organizations:   . Attends Banker Meetings:   Marland Kitchen Marital Status:   Intimate Partner Violence:   . Fear of Current or Ex-Partner:   . Emotionally Abused:   Marland Kitchen Physically Abused:   . Sexually Abused:      FAMILY HISTORY:  Family History  Problem Relation Age of Onset  . Diabetes Maternal Grandmother   . Diabetes Maternal Grandfather   . Lung cancer Paternal Grandmother        Due to Asbestos  . Lung cancer Paternal Grandfather        due to Asbestos  . Heart Problems Paternal Grandfather     Otherwise negative.   REVIEW OF SYSTEMS:  Review of Systems  Constitutional: Negative for chills and fever.  HENT: Negative for congestion and sore throat.   Respiratory: Negative for cough and shortness of breath.   Cardiovascular: Negative for chest pain and palpitations.  Gastrointestinal: Positive for abdominal pain, diarrhea and nausea. Negative for blood in stool, constipation and vomiting.  Genitourinary: Positive for dysuria. Negative for hematuria and urgency.  All other systems reviewed and are negative.   VITAL SIGNS:  Temp:  [98.9 F (37.2 C)-99.3 F (37.4 C)] 98.9 F (37.2 C) (06/21 1240) Pulse Rate:  [103-112] 103 (06/21 1240) Resp:  [16-18] 16 (06/21 1240) BP: (119-135)/(88-93) 135/93 (06/21 1240) SpO2:  [97 %] 97 % (06/21 1240) Weight:  [106.6 kg] 106.6 kg (06/21 1052)      Height: 5\' 11"  (180.3 cm) Weight: 106.6 kg BMI (Calculated): 32.79   PHYSICAL EXAM:  Physical Exam Vitals and nursing note reviewed. Exam conducted with a chaperone present.  Constitutional:      General: He is not in acute distress.    Appearance: He is well-developed. He is obese. He is not ill-appearing.  HENT:     Head: Normocephalic and atraumatic.  Eyes:     General: No scleral icterus.    Extraocular Movements: Extraocular movements intact.  Cardiovascular:     Rate and Rhythm: Regular rhythm. Tachycardia present.     Heart sounds: Normal heart sounds. No murmur heard.   Pulmonary:  Effort: Pulmonary effort is normal. No respiratory distress.     Breath sounds: Normal breath sounds.  Abdominal:     General: There is no distension.     Palpations: Abdomen is soft.     Tenderness: There is abdominal tenderness in the suprapubic area and left lower quadrant. There is no guarding or rebound.     Comments: Abdomen is soft, he is very tender in the LLQ/suprapubic region although I do not appreciate any rebound/rigidity/guarding, non-distended  Genitourinary:    Comments: Deferred Skin:    General: Skin is warm and dry.     Coloration: Skin is not jaundiced or pale.  Neurological:     General: No focal deficit present.     Mental Status: He is alert and oriented to person, place, and time.  Psychiatric:        Mood and Affect: Mood normal.        Behavior: Behavior normal.     INTAKE/OUTPUT:  This shift: No intake/output data recorded.  Last 2 shifts: @IOLAST2SHIFTS @  Labs:  CBC Latest Ref Rng & Units 04/08/2020 02/22/2020 02/21/2020  WBC 4.0 - 10.5 K/uL 17.2(H) 13.3(H) 11.2(H)  Hemoglobin 13.0 - 17.0 g/dL 16.0 15.2 14.4  Hematocrit 39 - 52 % 45.1 44.7 41.1  Platelets 150 - 400 K/uL 272 298 288   CMP Latest Ref Rng & Units 04/08/2020 02/22/2020 02/21/2020  Glucose 70 - 99 mg/dL 109(H) 109(H) 142(H)  BUN 6 - 20 mg/dL 16 14 19   Creatinine 0.61 - 1.24 mg/dL 1.17 1.11 1.05   Sodium 135 - 145 mmol/L 136 137 136  Potassium 3.5 - 5.1 mmol/L 4.1 3.8 3.6  Chloride 98 - 111 mmol/L 103 103 107  CO2 22 - 32 mmol/L 24 27 22   Calcium 8.9 - 10.3 mg/dL 9.8 9.4 8.9  Total Protein 6.5 - 8.1 g/dL 7.6 8.2(H) 7.2  Total Bilirubin 0.3 - 1.2 mg/dL 1.1 1.3(H) 0.7  Alkaline Phos 38 - 126 U/L 84 78 75  AST 15 - 41 U/L 22 18 22   ALT 0 - 44 U/L 30 27 27      Imaging studies:   CT Abdomen/Pelvis (04/08/2020) personally reviewed which shows acute diverticulitis of the sigmoid colon with small amount of scattered pneumoperitoneum seen also, and radiologist report reviewed below:  IMPRESSION: Focal sigmoid diverticulitis is noted which appears to have perforated. Pneumoperitoneum is noted anteriorly in the pelvis adjacent to the inflamed bowel as well as in the epigastric region. No definite abscess is seen at this time.   Assessment/Plan: (ICD-10's: K35.20) 44 y.o. male with leukocytosis and lower abdominal pain found to have perforated sigmoid diverticulitis with scattered pneumoperitoneum without gross peritonitis or sepsis on examination.    - I did have a long discussion with the patient and his wife regarding his disease process, ED findings, and treatment options including emergent surgical intervention vs attempting to manage this conservatively. At this time, given that he is hemodynamically stable and without overt peritonitis/sepsis on examination, I do think it would be reasonable to attempt conservative management. I did discuss the alternative option of emergent Hartman's procedure and temporizing colostomy with he patient and his wife in detail. After discussion of both options, we agreed to proceed with conservative management with the understanding that if he were to fail, or clinical deteriorate, we would proceed with more urgent surgical intervention and likely colostomy. They were understanding of this and agreeable with plan below...   - Admit to general  surgery  - NPO +  IVF Resuscitation  - IV ABx (Cipro + Flagyl) --> patient with anaphylactic penicillin allergy  - Pain control prn; antiemetics prn  - Serial abdominal examinations; may benefit from re-imaging in a few days or sooner pending clinical condition  - Monitor labs  - No plans for emergent intervention  - He will benefit from elective sigmoidectomy as an outpatient if we can get him through this acute insult  - Medical management of comorbid conditions; home medications   - DVT prophylaxis  All of the above findings and recommendations were discussed with the patient and his wife, and all of their questions were answered to their expressed satisfaction.  -- Lynden Oxford, PA-C  [facility] 04/08/2020, 1:52 PM [phone] M-F: 7am - 4pm  I saw and evaluated the patient.  I agree with the above documentation, exam, and plan, which I have edited where appropriate. Duanne Guess  6:38 PM

## 2020-04-08 NOTE — ED Provider Notes (Signed)
[facility] Emergency Department Provider Note  ____________________________________________   First MD Initiated Contact with Patient 04/08/20 1214     (approximate)  I have reviewed the triage vital signs and the nursing notes.   HISTORY  Chief Complaint Abdominal Pain    HPI Derek Ramirez is a 44 y.o. male with history of diverticulitis who comes in with abdominal pain.  Patient reports eating dinner last night around 6:00.  Around 8:00 he started having lower abdominal tenderness on both sides, moderate, constant, nothing makes better, nothing makes it worse.  Associate with some nausea and vomiting and some diarrhea.  Denies any chest pain, shortness of breath.  States that he has had diverticulitis before.  This would be his third time.  Denies any prior abdominal surgeries.  Still has his appendix.          Past Medical History:  Diagnosis Date  . Diverticulitis   . History of chicken pox   . History of IBS   . History of measles   . Panic disorder     Patient Active Problem List   Diagnosis Date Noted  . Allergic rhinitis 10/31/2015  . Anxiety 10/31/2015  . Chest pain 10/31/2015  . GERD (gastroesophageal reflux disease) 10/31/2015  . Heartburn 10/31/2015  . Palpitations 10/31/2015  . Panic disorder 10/31/2015  . 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 Headache, tension-type 03/01/2008  . Tobacco use 03/17/2006  . 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 BACK SURGERY      Prior to Admission medications   Medication Sig Start Date End Date Taking? Authorizing Provider  albuterol (VENTOLIN HFA) 108 (90 Base) MCG/ACT inhaler Inhale 2 puffs into the lungs every 6 (six) hours as needed for wheezing or shortness of breath. 03/27/20   Trey Sailors, PA-C  ALPRAZolam Prudy Feeler) 0.25 MG tablet Take 1 tablet (0.25 mg total) by mouth every 6 (six) hours as needed. 03/11/18   Malva Limes, MD  Fluticasone-Salmeterol (ADVAIR) 100-50 MCG/DOSE AEPB Inhale 1 puff into the lungs 2 (two) times daily. 03/27/20   Trey Sailors, PA-C  pantoprazole (PROTONIX) 20 MG tablet TAKE 1 TABLET BY MOUTH DAILY 03/04/19   Malva Limes, MD  sertraline (ZOLOFT) 50 MG tablet Take 1 tablet (50 mg total) by mouth daily. 04/11/18   Malva Limes, MD    Allergies Penicillins and Shellfish allergy  Family History  Problem Relation Age of Onset  . Diabetes Maternal Grandmother   . Diabetes Maternal Grandfather   . Lung cancer Paternal Grandmother        Due to Asbestos  . Lung cancer Paternal Grandfather        due to Asbestos  . Heart Problems Paternal Grandfather     Social History Social History   Tobacco Use  . Smoking status: Former Smoker    Packs/day: 2.00    Years: 16.00    Pack years: 32.00    Types: Cigarettes    Quit date: 10/19/2010    Years since quitting: 9.4  . Smokeless tobacco: Never Used  Substance Use Topics  . Alcohol use: No    Alcohol/week: 0.0 standard drinks  . Drug use: No      Review of Systems Constitutional: No fever/chills Eyes: No visual changes. ENT: No sore throat. Cardiovascular: Denies chest pain. Respiratory: Denies shortness of breath. Gastrointestinal: Abdominal pain, nausea, vomiting, diarrhea Genitourinary: Negative for dysuria. Musculoskeletal: Negative  for back pain. Skin: Negative for rash. Neurological: Negative for headaches, focal weakness or numbness. All other ROS negative ____________________________________________   PHYSICAL EXAM:  VITAL SIGNS: ED Triage Vitals  Enc Vitals Group     BP 04/08/20 1054 119/88     Pulse Rate 04/08/20 1054 (!) 112     Resp 04/08/20 1052 18     Temp 04/08/20 1054 99.3 F (37.4 C)     Temp Source 04/08/20 1052 Oral     SpO2 04/08/20 1054 97 %     Weight 04/08/20 1052 235 lb (106.6 kg)     Height 04/08/20 1052 5\' 11"  (1.803 m)     Head Circumference --      Peak Flow --        Pain Score 04/08/20 1052 7     Pain Loc --      Pain Edu? --      Excl. in GC? --     Constitutional: Alert and oriented. Well appearing and in no acute distress. Eyes: Conjunctivae are normal. EOMI. Head: Atraumatic. Nose: No congestion/rhinnorhea. Mouth/Throat: Mucous membranes are moist.   Neck: No stridor. Trachea Midline. FROM Cardiovascular: Tachycardic, regular rhythm. Grossly normal heart sounds.  Good peripheral circulation. Respiratory: Normal respiratory effort.  No retractions. Lungs CTAB. Gastrointestinal: Tender in the lower abdomen.  No distention. No abdominal bruits.  Musculoskeletal: No lower extremity tenderness nor edema.  No joint effusions. Neurologic:  Normal speech and language. No gross focal neurologic deficits are appreciated.  Skin:  Skin is warm, dry and intact. No rash noted. Psychiatric: Mood and affect are normal. Speech and behavior are normal. GU: Deferred   ____________________________________________   LABS (all labs ordered are listed, but only abnormal results are displayed)  Labs Reviewed  COMPREHENSIVE METABOLIC PANEL - Abnormal; Notable for the following components:      Result Value   Glucose, Bld 109 (*)    All other components within normal limits  CBC - Abnormal; Notable for the following components:   WBC 17.2 (*)    All other components within normal limits  URINALYSIS, COMPLETE (UACMP) WITH MICROSCOPIC - Abnormal; Notable for the following components:   Color, Urine YELLOW (*)    APPearance HAZY (*)    Specific Gravity, Urine 1.036 (*)    Protein, ur 30 (*)    Bacteria, UA RARE (*)    All other components within normal limits  LIPASE, BLOOD   ____________________________________________    RADIOLOGY   Official radiology report(s): CT ABDOMEN PELVIS W CONTRAST  Result Date: 04/08/2020 CLINICAL DATA:  Acute lower abdominal pain and distention. EXAM: CT ABDOMEN AND PELVIS WITH CONTRAST TECHNIQUE: Multidetector CT  imaging of the abdomen and pelvis was performed using the standard protocol following bolus administration of intravenous contrast. CONTRAST:  04/10/2020 OMNIPAQUE IOHEXOL 300 MG/ML  SOLN COMPARISON:  Feb 22, 2020. FINDINGS: Lower chest: No acute abnormality. Hepatobiliary: No focal liver abnormality is seen. No gallstones, gallbladder wall thickening, or biliary dilatation. Pancreas: Unremarkable. No pancreatic ductal dilatation or surrounding inflammatory changes. Spleen: Normal in size without focal abnormality. Adrenals/Urinary Tract: Adrenal glands are unremarkable. Kidneys are normal, without renal calculi, focal lesion, or hydronephrosis. Bladder is unremarkable. Stomach/Bowel: The stomach and appendix are unremarkable. There is no evidence of bowel obstruction. Focal sigmoid diverticulitis is noted which appears to have perforated. Air is noted anteriorly in the pelvis adjacent to the inflamed bowel as well as in the epigastric region. No definite abscess is seen at this time. Vascular/Lymphatic:  No significant vascular findings are present. No enlarged abdominal or pelvic lymph nodes. Reproductive: Prostate is unremarkable. Other: No abdominal wall hernia or abnormality. No abdominopelvic ascites. Musculoskeletal: No acute or significant osseous findings. IMPRESSION: Focal sigmoid diverticulitis is noted which appears to have perforated. Pneumoperitoneum is noted anteriorly in the pelvis adjacent to the inflamed bowel as well as in the epigastric region. No definite abscess is seen at this time. Critical Value/emergent results were called by telephone at the time of interpretation on 04/08/2020 at 1:36 pm to provider [facility] , who verbally acknowledged these results. Electronically Signed   By: Lupita Raider M.D.   On: 04/08/2020 13:36    ____________________________________________   PROCEDURES  Procedure(s) performed (including Critical Care):  .Critical Care Performed by: Concha Se,  MD Authorized by: Concha Se, MD   Critical care provider statement:    Critical care time (minutes):  35   Critical care was necessary to treat or prevent imminent or life-threatening deterioration of the following conditions:  Sepsis   Critical care was time spent personally by me on the following activities:  Discussions with consultants, evaluation of patient's response to treatment, examination of patient, ordering and performing treatments and interventions, ordering and review of laboratory studies, ordering and review of radiographic studies, pulse oximetry, re-evaluation of patient's condition, obtaining history from patient or surrogate and review of old charts     ____________________________________________   INITIAL IMPRESSION / ASSESSMENT AND PLAN / ED COURSE  Derek Ramirez was evaluated in Emergency Department on 04/08/2020 for the symptoms described in the history of present illness. He was evaluated in the context of the global COVID-19 pandemic, which necessitated consideration that the patient might be at risk for infection with the SARS-CoV-2 virus that causes COVID-19. Institutional protocols and algorithms that pertain to the evaluation of patients at risk for COVID-19 are in a state of rapid change based on information released by regulatory bodies including the CDC and federal and state organizations. These policies and algorithms were followed during the patient's care in the ED.    Patient is a 44 year old who comes in with lower abdominal tenderness in the setting of tachycardia low-grade temperatures and elevated white count.  Will get labs to evaluate for electrolyte abnormalities, AKI, urine evaluate for UTI, lipase and LFTs to evaluate for cholecystitis.  Given patient's tenderness in the lower abdomen I think we should proceed with CT imaging to rule out diverticulitis, appendicitis, perforation, abscess.  We will give a dose of IV Dilaudid, Zofran, fluids and  reassess patient.   UA no evidence of UTI.  Lipase and LFTs are normal.  White count is elevated at 17.2 which is up from his baseline  On reevaluation after the first bag of fluids heart rate has come down.  Patient remains well-perfused.  Blood pressures look stable.  1:54 PM ED scan concerning for diverticulitis with concerns for perforation.  At this time given concern for source patient does meet SIRS criteria with tachycardia and white count and now has abdominal source.  We will add another liter of fluid.  Patient is anaphylactic to penicillins will avoid Zosyn.  We will give him levo, Flagyl, dose of Vanco given recent treatment for diverticulitis.  Will discuss with the surgery team for admission.  D/w Dr. Lady Gary        ____________________________________________   FINAL CLINICAL IMPRESSION(S) / ED DIAGNOSES   Final diagnoses:  Diverticulitis  Perforation bowel (HCC)  Sepsis, due to unspecified  organism, unspecified whether acute organ dysfunction present ([place])      MEDICATIONS GIVEN DURING THIS VISIT:  Medications  levofloxacin (LEVAQUIN) IVPB 750 mg (has no administration in time range)  metroNIDAZOLE (FLAGYL) IVPB 500 mg (has no administration in time range)  vancomycin (VANCOCIN) IVPB 1000 mg/200 mL premix (has no administration in time range)  sodium chloride 0.9 % bolus 1,000 mL (has no administration in time range)  sodium chloride flush (NS) 0.9 % injection 3 mL (3 mLs Intravenous Given 04/08/20 1231)  sodium chloride 0.9 % bolus 1,000 mL (1,000 mLs Intravenous New Bag/Given 04/08/20 1239)  ondansetron (ZOFRAN) injection 4 mg (4 mg Intravenous Given 04/08/20 1236)  HYDROmorphone (DILAUDID) injection 0.5 mg (0.5 mg Intravenous Given 04/08/20 1236)  iohexol (OMNIPAQUE) 300 MG/ML solution 100 mL (100 mLs Intravenous Contrast Given 04/08/20 1301)     ED Discharge Orders    None       Note:  This document was prepared using Dragon voice recognition software  and may include unintentional dictation errors.   Vanessa Crugers, MD 04/08/20 [phone]

## 2020-04-08 NOTE — Telephone Encounter (Signed)
Pt. Reports he started having abdominal pain last night 2 hours after dinner. Pain is 8/10 and in center of abdomen behind belly button. Had diarrhea last night and some vomiting this morning. No availability in the practice today per [facility]. Pt. Will go to UC/ED.  Reason for Disposition  [1] MILD-MODERATE pain AND [2] constant AND [3] present > 2 hours  Answer Assessment - Initial Assessment Questions 1. LOCATION: "Where does it hurt?"      Center behind belly button 2. RADIATION: "Does the pain shoot anywhere else?" (e.g., chest, back)     No 3. ONSET: "When did the pain begin?" (Minutes, hours or days ago)      Started last night 4. SUDDEN: "Gradual or sudden onset?"     Sudden 5. PATTERN "Does the pain come and go, or is it constant?"    - If constant: "Is it getting better, staying the same, or worsening?"      (Note: Constant means the pain never goes away completely; most serious pain is constant and it progresses)     - If intermittent: "How long does it last?" "Do you have pain now?"     (Note: Intermittent means the pain goes away completely between bouts)     Constant 6. SEVERITY: "How bad is the pain?"  (e.g., Scale 1-10; mild, moderate, or severe)    - MILD (1-3): doesn't interfere with normal activities, abdomen soft and not tender to touch     - MODERATE (4-7): interferes with normal activities or awakens from sleep, tender to touch     - SEVERE (8-10): excruciating pain, doubled over, unable to do any normal activities       8 7. RECURRENT SYMPTOM: "Have you ever had this type of stomach pain before?" If Yes, ask: "When was the last time?" and "What happened that time?"      Yes 8. CAUSE: "What do you think is causing the stomach pain?"     Diverticulitis 9. RELIEVING/AGGRAVATING FACTORS: "What makes it better or worse?" (e.g., movement, antacids, bowel movement)     No 10. OTHER SYMPTOMS: "Has there been any vomiting, diarrhea, constipation, or urine problems?"        Diarrhea, vomiting  Protocols used: ABDOMINAL PAIN - MALE-A-AH

## 2020-04-08 NOTE — Progress Notes (Signed)
Report called to [facility] for patient being transferred to room 208.

## 2020-04-09 LAB — CBC
HCT: 38.3 % — ABNORMAL LOW (ref 39.0–52.0)
Hemoglobin: 13.2 g/dL (ref 13.0–17.0)
MCH: 28.8 pg (ref 26.0–34.0)
MCHC: 34.5 g/dL (ref 30.0–36.0)
MCV: 83.6 fL (ref 80.0–100.0)
Platelets: 210 10*3/uL (ref 150–400)
RBC: 4.58 MIL/uL (ref 4.22–5.81)
RDW: 12.9 % (ref 11.5–15.5)
WBC: 9.3 10*3/uL (ref 4.0–10.5)
nRBC: 0 % (ref 0.0–0.2)

## 2020-04-09 LAB — BASIC METABOLIC PANEL
Anion gap: 7 (ref 5–15)
BUN: 12 mg/dL (ref 6–20)
CO2: 24 mmol/L (ref 22–32)
Calcium: 8.5 mg/dL — ABNORMAL LOW (ref 8.9–10.3)
Chloride: 105 mmol/L (ref 98–111)
Creatinine, Ser: 1.11 mg/dL (ref 0.61–1.24)
GFR calc Af Amer: >60 mL/min (ref >60)
GFR calc non Af Amer: >60 mL/min (ref >60)
Glucose, Bld: 99 mg/dL (ref 70–99)
Potassium: 3.7 mmol/L (ref 3.5–5.1)
Sodium: 136 mmol/L (ref 135–145)

## 2020-04-09 LAB — MAGNESIUM: Magnesium: 1.7 mg/dL (ref 1.7–2.4)

## 2020-04-09 LAB — PHOSPHORUS: Phosphorus: 2.8 mg/dL (ref 2.5–4.6)

## 2020-04-09 NOTE — Progress Notes (Addendum)
[facility] SURGICAL PROGRESS NOTE (cpt [phone])  Hospital Day(s): 1.   Interval History: Patient seen and examined, no acute events or new complaints overnight. Patient reports that his abdominal pain is overall improved compared to admission. No nausea, emesis, diarrhea, chills. He did have an isolated fever to 100.9 at 1700 yesterday but has otherwise been afebrile. His leukocytosis is resolved this morning with WBC of 9.3K, and the remainder of his labs are grossly unremarkable. He has been NPO since admission. No other complaints.   Review of Systems:  Constitutional: denies fever, chills  HEENT: denies cough or congestion  Respiratory: denies any shortness of breath  Cardiovascular: denies chest pain or palpitations  Gastrointestinal: + abdominal pain (improved), denied N/V, or diarrhea/and bowel function as per interval history Genitourinary: denies burning with urination or urinary frequency   Vital signs in last 24 hours: [min-max] current  Temp:  [98.3 F (36.8 C)-100.9 F (38.3 C)] 98.3 F (36.8 C) (06/22 0455) Pulse Rate:  [83-113] 83 (06/22 0455) Resp:  [16-20] 20 (06/22 0455) BP: (103-135)/(73-93) 103/73 (06/22 0455) SpO2:  [96 %-100 %] 96 % (06/22 0455) Weight:  [106.6 kg] 106.6 kg (06/21 1052)     Height: 5\' 11"  (180.3 cm) Weight: 106.6 kg BMI (Calculated): 32.79   Intake/Output last 2 shifts:  No intake/output data recorded.   Physical Exam:  Constitutional: alert, cooperative and no distress  HENT: normocephalic without obvious abnormality  Eyes: PERRL, EOM's grossly intact and symmetric  Respiratory: breathing non-labored at rest  Cardiovascular: regular rate and sinus rhythm  Gastrointestinal: Soft, tenderness across the lower abdomen although this is markedly improved from yesterday, non-distended, no rebound/guarding Musculoskeletal: No edema or wounds, motor and sensation grossly intact, NT    Labs:  CBC Latest Ref Rng & Units 04/09/2020  04/08/2020 02/22/2020  WBC 4.0 - 10.5 K/uL 9.3 17.2(H) 13.3(H)  Hemoglobin 13.0 - 17.0 g/dL 13.2 16.0 15.2  Hematocrit 39 - 52 % 38.3(L) 45.1 44.7  Platelets 150 - 400 K/uL 210 272 298   CMP Latest Ref Rng & Units 04/09/2020 04/08/2020 02/22/2020  Glucose 70 - 99 mg/dL 99 109(H) 109(H)  BUN 6 - 20 mg/dL 12 16 14   Creatinine 0.61 - 1.24 mg/dL 1.11 1.17 1.11  Sodium 135 - 145 mmol/L 136 136 137  Potassium 3.5 - 5.1 mmol/L 3.7 4.1 3.8  Chloride 98 - 111 mmol/L 105 103 103  CO2 22 - 32 mmol/L 24 24 27   Calcium 8.9 - 10.3 mg/dL 8.5(L) 9.8 9.4  Total Protein 6.5 - 8.1 g/dL - 7.6 8.2(H)  Total Bilirubin 0.3 - 1.2 mg/dL - 1.1 1.3(H)  Alkaline Phos 38 - 126 U/L - 84 78  AST 15 - 41 U/L - 22 18  ALT 0 - 44 U/L - 30 27    Imaging studies: No new pertinent imaging studies   Assessment/Plan: (ICD-10's: K83.20) 44 y.o. male with now resolved leukocytosis and improved lower abdominal pain found to have perforated sigmoid diverticulitis with scattered pneumoperitoneum without gross peritonitis or sepsis on examination.     - Remain NPO today + IVF Resuscitation             - IV ABx (Cipro + Flagyl) --> patient with anaphylactic penicillin allergy             - Pain control prn; antiemetics prn             - Serial abdominal examinations; may benefit from re-imaging in a few days or sooner  pending clinical condition             - Monitor labs; markedly improved             - No plans for emergent intervention             - He will benefit from elective sigmoidectomy as an outpatient if we can get him through this acute insult             - Medical management of comorbid conditions; home medications              - DVT prophylaxis   All of the above findings and recommendations were discussed with the patient, and the medical team, and all of patient's questions were answered to his expressed satisfaction.  -- Lynden Oxford, PA-C [facility] 04/09/2020, 7:15 AM [phone] M-F:  7am - 4pm   I saw and evaluated the patient.  I agree with the above documentation, exam, and plan, which I have edited where appropriate. Duanne Guess  9:34 AM

## 2020-04-10 DIAGNOSIS — K5792 Diverticulitis of intestine, part unspecified, without perforation or abscess without bleeding: Secondary | ICD-10-CM

## 2020-04-10 LAB — GLUCOSE, CAPILLARY: Glucose-Capillary: 86 mg/dL (ref 70–99)

## 2020-04-10 NOTE — Progress Notes (Addendum)
[facility] SURGICAL PROGRESS NOTE (cpt [phone])  Hospital Day(s): 2.   Interval History: Patient seen and examined, no acute events or new complaints overnight. Patient reports he is feeling much better this morning and his abdominal pain has nearly resolved. No fever, chills, nausea, or emesis. No new imaging studies or labs this morning. He has remained NPO. No new complaints.   Review of Systems:  Constitutional: denies fever, chills  HEENT: denies cough or congestion  Respiratory: denies any shortness of breath  Cardiovascular: denies chest pain or palpitations  Gastrointestinal: denies abdominal pain, N/V, or diarrhea/and bowel function as per interval history Genitourinary: denies burning with urination or urinary frequency   Vital signs in last 24 hours: [min-max] current  Temp:  [98.3 F (36.8 C)-99 F (37.2 C)] 98.6 F (37 C) (06/23 0517) Pulse Rate:  [85-96] 85 (06/23 0517) Resp:  [16] 16 (06/22 1938) BP: (114-122)/(79-89) 118/89 (06/23 0517) SpO2:  [96 %-98 %] 98 % (06/23 0517)     Height: 5\' 11"  (180.3 cm) Weight: 106.6 kg BMI (Calculated): 32.79   Intake/Output last 2 shifts:  06/22 0701 - 06/23 0700 In: 2876.8 [I.V.:1876.5; IV Piggyback:1000.3] Out: 500 [Urine:500]   Physical Exam:  Constitutional: alert, cooperative and no distress  HENT: normocephalic without obvious abnormality  Eyes: PERRL, EOM's grossly intact and symmetric  Respiratory: breathing non-labored at rest  Cardiovascular: regular rate and sinus rhythm  Gastrointestinal: Soft, non-tender, non-distended, no rebound/guarding Musculoskeletal: No edema or wounds, motor and sensation grossly intact, NT    Labs:  CBC Latest Ref Rng & Units 04/09/2020 04/08/2020 02/22/2020  WBC 4.0 - 10.5 K/uL 9.3 17.2(H) 13.3(H)  Hemoglobin 13.0 - 17.0 g/dL 04/23/2020 29.5 62.1  Hematocrit 39 - 52 % 38.3(L) 45.1 44.7  Platelets 150 - 400 K/uL 210 272 298   CMP Latest Ref Rng & Units 04/09/2020 04/08/2020  02/22/2020  Glucose 70 - 99 mg/dL 99 04/23/2020) 657(Q)  BUN 6 - 20 mg/dL 12 16 14   Creatinine 0.61 - 1.24 mg/dL 469(G 2.95  Sodium 135 - 145 mmol/L 136 136 137  Potassium 3.5 - 5.1 mmol/L 3.7 4.1 3.8  Chloride 98 - 111 mmol/L 105 103 103  CO2 22 - 32 mmol/L 24 24 27   Calcium 8.9 - 10.3 mg/dL 2.84) 9.8 9.4  Total Protein 6.5 - 8.1 g/dL - 7.6 1.32)  Total Bilirubin 0.3 - 1.2 mg/dL - 1.1 )  Alkaline Phos 38 - 126 U/L - 84 78  AST 15 - 41 U/L - 22 18  ALT 0 - 44 U/L - 30 27     Imaging studies: No new pertinent imaging studies   Assessment/Plan: (ICD-10's: K39.20) 44 y.o. male with nearly resolved lower abdominal pain found to have perforated sigmoid diverticulitis with scattered pneumoperitoneum without gross peritonitis or sepsis on examination.    - Will advance to [facility]; consider Full liquids for dinner if doing well  - Wean from IVF once diet advanced   - IV ABx (Cipro + Flagyl) --> patient with anaphylactic penicillin allergy             - Pain control prn; antiemetics prn             - Serial abdominal examinations; may benefit from re-imaging in a few days or sooner pending clinical condition             - Monitor labs; markedly improved             - No plans  for emergent intervention             - He will benefit from elective sigmoidectomy as an outpatient if we can get him through this acute insult             - Medical management of comorbid conditions; home medications              - DVT prophylaxis    All of the above findings and recommendations were discussed with the patient, and the medical team, and all of patient's questions were answered to his expressed satisfaction.  -- Edison Simon, PA-C  [facility] 04/10/2020, 7:24 AM [phone] M-F: 7am - 4pm  Patient not in room when I came by to see him. I agree with the above documentation, exam, and plan, which I have edited where appropriate. Fredirick Maudlin  1:55 PM

## 2020-04-11 MED ORDER — METRONIDAZOLE 500 MG PO TABS
500.0000 mg | ORAL_TABLET | Freq: Three times a day (TID) | ORAL | 0 refills | Status: AC
Start: 2020-04-11 — End: 2020-04-21

## 2020-04-11 MED ORDER — CIPROFLOXACIN HCL 500 MG PO TABS
500.0000 mg | ORAL_TABLET | Freq: Two times a day (BID) | ORAL | 0 refills | Status: AC
Start: 2020-04-11 — End: 2020-04-21

## 2020-04-11 NOTE — Progress Notes (Signed)
Pt discharged per MD order. IVs removed. Discharge instructions reviewed with pt. Pt verbalized understanding of instructions with all questions answered to pt satisfaction. Pt taken to car in wheelchair by staff.

## 2020-04-11 NOTE — Discharge Summary (Addendum)
[facility] SURGICAL DISCHARGE SUMMARY (cpt: [phone])  Patient ID: Derek Ramirez MRN: [id number] DOB/AGE: Dec 10, 1975 44 y.o.  Admit date: 04/08/2020 Discharge date: 04/11/2020  Discharge Diagnoses Patient Active Problem List   Diagnosis Date Noted   Diverticulitis of large intestine with perforation without bleeding 04/08/2020    Consultants None  Procedures None  HPI: 44 y.o. male presented to [facility] ED Monday for abdominal pain. Patient reports that around 830 PM the night prior, he had the abrupt onset of suprapubic and LLQ abdominal pain. This was severe and sharp in nature. The pain has been constant since the onset, does not radiate, and is exacerbated with movement and coughing. He endorses associated nausea, diarrhea, and dysuria with the pain. No fever, chills, cough, CP, SOB, or emesis. He has a history of diverticulitis and believes this is his 4th episode in the last year. Most recently he was seen in the ED on 02/2020 for this and sent home on Bactrim. This feels similar to his previous episodes of diverticulitis but much more severe. He has never seen a Education officer, environmental for this. No previous abdominal surgeries. Last colonoscopy was 5-6 years ago at [facility], but I am unable to find the report in [place]. On arrival in the ED, he was afebrile and normotensive but tachycardic. Laboratory work up revealed leukocytosis to 17K but was otherwise reassuring. CT Abdomen/Pelvis was concerning for perforated sigmoid diverticulitis with scattered pneumoperitoneum.   Hospital Course: Patient was admitted to general service and underwent conservative management. His leukocytosis resolved on hospital day 1 and had resolution of pain on hospital day 2. Advancement of patient's diet and ambulation were well-tolerated. The remainder of patient's hospital course was essentially unremarkable, and discharge planning was initiated accordingly with patient safely able to be discharged  home with appropriate discharge instructions, antibiotics (Cipro + Flagyl x10 days to complete 14 days total), pain control, and outpatient follow-up after all of his questions were answered to his expressed satisfaction.   Discharge Condition: Good   Physical Examination:  Constitutional: alert, cooperative and no distress  HENT: normocephalic without obvious abnormality  Eyes: PERRL, EOM's grossly intact and symmetric  Respiratory: breathing non-labored at rest  Cardiovascular: regular rate and sinus rhythm  Gastrointestinal: Soft, non-tender, non-distended, no rebound/guarding Musculoskeletal: No edema or wounds, motor and sensation grossly intact, NT    Allergies as of 04/11/2020       Reactions   Penicillins Anaphylaxis   Has patient had a PCN reaction causing immediate rash, facial/tongue/throat swelling, SOB or lightheadedness with hypotension: Yes Has patient had a PCN reaction causing severe rash involving mucus membranes or skin necrosis: No Has patient had a PCN reaction that required hospitalization: Patient not sure Has patient had a PCN reaction occurring within the last 10 years: No If all of the above answers are "NO", then may proceed with Cephalosporin use.   Shellfish Allergy    Throat swells and cant breath        Medication List     TAKE these medications    albuterol 108 (90 Base) MCG/ACT inhaler Commonly known as: VENTOLIN HFA Inhale 2 puffs into the lungs every 6 (six) hours as needed for wheezing or shortness of breath.   ciprofloxacin 500 MG tablet Commonly known as: Cipro Take 1 tablet (500 mg total) by mouth 2 (two) times daily for 10 days.   Fluticasone-Salmeterol 100-50 MCG/DOSE Aepb Commonly known as: ADVAIR Inhale 1 puff into the lungs 2 (two) times daily.   metroNIDAZOLE  500 MG tablet Commonly known as: Flagyl Take 1 tablet (500 mg total) by mouth 3 (three) times daily for 10 days.   pantoprazole 20 MG tablet Commonly known as:  PROTONIX TAKE 1 TABLET BY MOUTH DAILY   sertraline 50 MG tablet Commonly known as: ZOLOFT Take 1 tablet (50 mg total) by mouth daily.          Follow-up Information     Duanne Guess, MD. Schedule an appointment as soon as possible for a visit in 2 week(s).   Specialty: General Surgery Why: hospital follow up complicated diverticulitis  Contact information: [address] [place] [address] [phone]                  Time spent on discharge management including discussion of hospital course, clinical condition, outpatient instructions, prescriptions, and follow up with the patient and members of the medical team: >30 minutes  -- Lynden Oxford , PA-C [facility]  04/11/2020, 8:20 AM [phone] M-F: 7am - 4pm  I agree with the above documentation, exam, and plan, which I have edited where appropriate. Duanne Guess  1:00 PM

## 2020-04-12 ENCOUNTER — Telehealth: Payer: Self-pay | Admitting: Physician Assistant

## 2020-04-12 DIAGNOSIS — G4733 Obstructive sleep apnea (adult) (pediatric): Secondary | ICD-10-CM

## 2020-04-12 NOTE — Telephone Encounter (Signed)
Sleep Study shows obstructive sleep apnea. Orders signed and ready to be faxed,

## 2020-04-13 LAB — CULTURE, BLOOD (ROUTINE X 2)
Culture: NO GROWTH
Culture: NO GROWTH
Special Requests: ADEQUATE

## 2020-04-17 NOTE — Telephone Encounter (Signed)
Patient was advised.  

## 2020-04-30 ENCOUNTER — Telehealth: Payer: Self-pay | Admitting: Physician Assistant

## 2020-04-30 ENCOUNTER — Ambulatory Visit (INDEPENDENT_AMBULATORY_CARE_PROVIDER_SITE_OTHER): Payer: No Typology Code available for payment source | Admitting: Physician Assistant

## 2020-04-30 ENCOUNTER — Encounter: Payer: Self-pay | Admitting: Physician Assistant

## 2020-04-30 ENCOUNTER — Ambulatory Visit
Admission: RE | Admit: 2020-04-30 | Discharge: 2020-04-30 | Disposition: A | Discharge status: Home / Self Care | Payer: No Typology Code available for payment source | Source: Ambulatory Visit | Attending: Physician Assistant | Admitting: Physician Assistant

## 2020-04-30 ENCOUNTER — Other Ambulatory Visit: Payer: Self-pay

## 2020-04-30 VITALS — BP 129/85 | HR 87 | Temp 98.6°F | Resp 12 | Ht 71.0 in | Wt 230.6 lb

## 2020-04-30 DIAGNOSIS — K5720 Diverticulitis of large intestine with perforation and abscess without bleeding: Secondary | ICD-10-CM

## 2020-04-30 DIAGNOSIS — R109 Unspecified abdominal pain: Secondary | ICD-10-CM

## 2020-04-30 MED ORDER — CIPROFLOXACIN HCL 500 MG PO TABS
500.0000 mg | ORAL_TABLET | Freq: Two times a day (BID) | ORAL | 0 refills | Status: DC
Start: 2020-04-30 — End: 2020-05-09

## 2020-04-30 MED ORDER — IOHEXOL 300 MG/ML  SOLN
100.0000 mL | Freq: Once | INTRAMUSCULAR | Status: AC | PRN
  Administered 2020-04-30: 100 mL via INTRAVENOUS

## 2020-04-30 MED ORDER — METRONIDAZOLE 500 MG PO TABS
500.0000 mg | ORAL_TABLET | Freq: Three times a day (TID) | ORAL | 0 refills | Status: DC
Start: 2020-04-30 — End: 2020-05-09

## 2020-04-30 NOTE — Telephone Encounter (Signed)
Personally reviewed patient's CT Abdomen/Pelvis from today which showed overall improvement in previously seen pneumoperitoneum, there is still extra luminal gas around the sigmoid colon which does appear slightly worse than before and there are a few air bubbles in his bladder. There is no gross abscess seen. I have copied the radiologist report below:   IMPRESSION: 1. Although intraperitoneal free air seen previously has resolved in the interval, there is and increased volume of extraluminal gas trapped adjacent to the abnormal appearing distal sigmoid colon and tracking down along the dome of bladder. Imaging features remain compatible with perforated diverticulitis although lack of resolution and the relatively focal wall thickening in this region raise the question of perforated neoplasm. No evidence for para colonic abscess. 2. Tiny gas bubbles in the bladder lumen, presumably from recent instrumentation.  I called the patient to discuss these results with him over the phone as well as reviewed the CT images with Dr Everlene Farrier as Dr Lady Gary is not available this week.  Again, he was not septic nor peritonitic on my evaluation this morning.   -- He will continue Cipro/Flagyl as prescribed x10 days -- I discussed extensively the signs/symptoms that should prompt a phone call or return to the ED including, but not limited to, fever >101, severe abdominal pain, nausea/emesis. He is understanding of these -- I will see him in the office for follow up in 1-2 weeks, or sooner if needed -- All questions were addressed and answered  -- Lynden Oxford, PA-C [facility] 04/30/2020, 2:52 PM [phone] M-F: 7am - 4pm

## 2020-04-30 NOTE — Patient Instructions (Addendum)
Laqueta Due, PA suggested patient have a repeated CT scan. Pending results will determine if patient will keep scheduled appointment with Dr.Cannon.  Ian Malkin will also sent patient Antibiotic Treatment to patient's local pharmacy.  Diverticulitis  Diverticulitis is infection or inflammation of small pouches (diverticula) in the colon that form due to a condition called diverticulosis. Diverticula can trap stool (feces) and bacteria, causing infection and inflammation. Diverticulitis may cause severe stomach pain and diarrhea. It may lead to tissue damage in the colon that causes bleeding. The diverticula may also burst (rupture) and cause infected stool to enter other areas of the abdomen. Complications of diverticulitis can include:  Bleeding.  Severe infection.  Severe pain.  Rupture (perforation) of the colon.  Blockage (obstruction) of the colon. What are the causes? This condition is caused by stool becoming trapped in the diverticula, which allows bacteria to grow in the diverticula. This leads to inflammation and infection. What increases the risk? You are more likely to develop this condition if:  You have diverticulosis. The risk for diverticulosis increases if: ? You are overweight or obese. ? You use tobacco products. ? You do not get enough exercise.  You eat a diet that does not include enough fiber. High-fiber foods include fruits, vegetables, beans, nuts, and whole grains. What are the signs or symptoms? Symptoms of this condition may include:  Pain and tenderness in the abdomen. The pain is normally located on the left side of the abdomen, but it may occur in other areas.  Fever and chills.  Bloating.  Cramping.  Nausea.  Vomiting.  Changes in bowel routines.  Blood in your stool. How is this diagnosed? This condition is diagnosed based on:  Your medical history.  A physical exam.  Tests to make sure there is nothing else causing your condition.  These tests may include: ? Blood tests. ? Urine tests. ? Imaging tests of the abdomen, including X-rays, ultrasounds, MRIs, or CT scans. How is this treated? Most cases of this condition are mild and can be treated at home. Treatment may include:  Taking over-the-counter pain medicines.  Following a clear liquid diet.  Taking antibiotic medicines by mouth.  Rest. More severe cases may need to be treated at a hospital. Treatment may include:  Not eating or drinking.  Taking prescription pain medicine.  Receiving antibiotic medicines through an IV tube.  Receiving fluids and nutrition through an IV tube.  Surgery. When your condition is under control, your health care provider may recommend that you have a colonoscopy. This is an exam to look at the entire large intestine. During the exam, a lubricated, bendable tube is inserted into the anus and then passed into the rectum, colon, and other parts of the large intestine. A colonoscopy can show how severe your diverticula are and whether something else may be causing your symptoms. Follow these instructions at home: Medicines  Take over-the-counter and prescription medicines only as told by your health care provider. These include fiber supplements, probiotics, and stool softeners.  If you were prescribed an antibiotic medicine, take it as told by your health care provider. Do not stop taking the antibiotic even if you start to feel better.  Do not drive or use heavy machinery while taking prescription pain medicine. General instructions   Follow a full liquid diet or another diet as directed by your health care provider. After your symptoms improve, your health care provider may tell you to change your diet. He or she may recommend that  you eat a diet that contains at least 25 g (25 grams) of fiber daily. Fiber makes it easier to pass stool. Healthy sources of fiber include: ? Berries. One cup contains 4-8 grams of fiber. ? Beans  or lentils. One half cup contains 5-8 grams of fiber. ? Green vegetables. One cup contains 4 grams of fiber.  Exercise for at least 30 minutes, 3 times each week. You should exercise hard enough to raise your heart rate and break a sweat.  Keep all follow-up visits as told by your health care provider. This is important. You may need a colonoscopy. Contact a health care provider if:  Your pain does not improve.  You have a hard time drinking or eating food.  Your bowel movements do not return to normal. Get help right away if:  Your pain gets worse.  Your symptoms do not get better with treatment.  Your symptoms suddenly get worse.  You have a fever.  You vomit more than one time.  You have stools that are bloody, black, or tarry. Summary  Diverticulitis is infection or inflammation of small pouches (diverticula) in the colon that form due to a condition called diverticulosis. Diverticula can trap stool (feces) and bacteria, causing infection and inflammation.  You are at higher risk for this condition if you have diverticulosis and you eat a diet that does not include enough fiber.  Most cases of this condition are mild and can be treated at home. More severe cases may need to be treated at a hospital.  When your condition is under control, your health care provider may recommend that you have an exam called a colonoscopy. This exam can show how severe your diverticula are and whether something else may be causing your symptoms. This information is not intended to replace advice given to you by your health care provider. Make sure you discuss any questions you have with your health care provider. Document Revised: 09/17/2017 Document Reviewed: 11/07/2016 Elsevier Patient Education  2020 [employer].

## 2020-04-30 NOTE — Progress Notes (Signed)
[facility] NEW PATIENT  Referring provider:  Trey Sailors, PA-C [address] [place],  [place] [address]  HISTORY OF PRESENT ILLNESS (HPI):  44 y.o. male known to our practice secondary to recent admission on 06/21 for complicated diverticulitis for which he was managed conservatively. He was discharged on 06/24 with Cipro + Flagyl x10 days which he has completed. He did well following this. He presents to the clinic today for evaluation of abdominal pain. Patient reports that in the last two days he has noticed the return of lower abdominal pain. He described this as a mild to moderate discomfort which has become more constant in the last 2 days. This pain is similar to his previous episodes of diverticulitis but is less severe, and he is concerned because he does not want to get as sick as before. He endorses low grade fever to 99.4, nausea, and nob-bloody diarrhea with the pain. No chills, cough, congestion, SOB, CP, emesis, or urinary changes. Again, this presentation is similar to his previous presentations for diverticulitis. No new complaints.    PAST MEDICAL HISTORY (PMH):  Past Medical History:  Diagnosis Date   Diverticulitis    History of chicken pox    History of IBS    History of measles    Panic disorder      PAST SURGICAL HISTORY (PSH):  Past Surgical History:  Procedure Laterality Date   BACK SURGERY     WISDOM TOOTH EXTRACTION       MEDICATIONS:  Prior to Admission medications   Medication Sig Start Date End Date Taking? Authorizing Provider  albuterol (VENTOLIN HFA) 108 (90 Base) MCG/ACT inhaler Inhale 2 puffs into the lungs every 6 (six) hours as needed for wheezing or shortness of breath. 03/27/20  Yes Trey Sailors, PA-C  Fluticasone-Salmeterol (ADVAIR) 100-50 MCG/DOSE AEPB Inhale 1 puff into the lungs 2 (two) times daily. 03/27/20  Yes Osvaldo Angst M, PA-C  pantoprazole (PROTONIX) 20 MG tablet TAKE 1  TABLET BY MOUTH DAILY 03/04/19  Yes Malva Limes, MD  sertraline (ZOLOFT) 50 MG tablet Take 1 tablet (50 mg total) by mouth daily. 04/11/18  Yes Malva Limes, MD     ALLERGIES:  Allergies  Allergen Reactions   Penicillins Anaphylaxis    Has patient had a PCN reaction causing immediate rash, facial/tongue/throat swelling, SOB or lightheadedness with hypotension: Yes Has patient had a PCN reaction causing severe rash involving mucus membranes or skin necrosis: No Has patient had a PCN reaction that required hospitalization No Patient not sure Has patient had a PCN reaction occurring within the last 10 years: No If all of the above answers are "NO", then may proceed with Cephalosporin use.   Shellfish Allergy     Throat swells and cant breath     SOCIAL HISTORY:  Social History   Socioeconomic History   Marital status: Married    Spouse name: Not on file   Number of children: Not on file   Years of education: Not on file   Highest education level: Not on file  Occupational History   Not on file  Tobacco Use   Smoking status: Former Smoker    Packs/day: 2.00    Years: 16.00    Pack years: 32.00    Types: Cigarettes    Quit date: 10/19/2010    Years since quitting: 9.5   Smokeless tobacco: Never Used  Substance and Sexual Activity   Alcohol use: No  Alcohol/week: 0.0 standard drinks   Drug use: No   Sexual activity: Not on file  Other Topics Concern   Not on file  Social History Narrative   Not on file   Social Determinants of Health   Financial Resource Strain:    Difficulty of Paying Living Expenses:   Food Insecurity:    Worried About Programme researcher, broadcasting/film/video in the Last Year:    Barista in the Last Year:   Transportation Needs:    Freight forwarder (Medical):    Lack of Transportation (Non-Medical):   Physical Activity:    Days of Exercise per Week:    Minutes of Exercise per Session:   Stress:    Feeling of Stress :    Social Connections:    Frequency of Communication with Friends and Family:    Frequency of Social Gatherings with Friends and Family:    Attends Religious Services:    Active Member of Clubs or Organizations:    Attends Banker Meetings:    Marital Status:   Intimate Partner Violence:    Fear of Current or Ex-Partner:    Emotionally Abused:    Physically Abused:    Sexually Abused:     The patient currently resides (home / rehab facility / nursing home): Home The patient normally is (ambulatory / bedbound): Ambulatory  FAMILY HISTORY:  Family History  Problem Relation Age of Onset   Diabetes Maternal Grandmother    Diabetes Maternal Grandfather    Lung cancer Paternal Grandmother        Due to Asbestos   Lung cancer Paternal Grandfather        due to Asbestos   Heart Problems Paternal Grandfather     Otherwise negative/non-contributory.  REVIEW OF SYSTEMS:  Review of Systems  Constitutional: Positive for fever. Negative for chills.  HENT: Negative for congestion and sore throat.   Respiratory: Negative for cough and shortness of breath.   Cardiovascular: Negative for chest pain and palpitations.  Gastrointestinal: Positive for abdominal pain, diarrhea and nausea. Negative for blood in stool, constipation and vomiting.  Genitourinary: Negative for dysuria and urgency.  All other systems reviewed and are negative.    VITAL SIGNS:  @VSRANGES @     Height: 5\' 11"  (180.3 cm) Weight: 230 lb 9.6 oz (104.6 kg) BMI (Calculated): 32.18   PHYSICAL EXAM:  Physical Exam Constitutional:      General: He is not in acute distress.    Appearance: Normal appearance. He is obese. He is not ill-appearing or toxic-appearing.  HENT:     Head: Normocephalic and atraumatic.  Eyes:     General: No scleral icterus.    Conjunctiva/sclera: Conjunctivae normal.  Cardiovascular:     Rate and Rhythm: Normal rate and regular rhythm.     Pulses: Normal pulses.      Heart sounds: No murmur heard.   Pulmonary:     Effort: Pulmonary effort is normal. No respiratory distress.     Breath sounds: Normal breath sounds.  Abdominal:     General: Abdomen is flat. There is no distension.     Palpations: Abdomen is soft.     Tenderness: There is abdominal tenderness in the suprapubic area and left lower quadrant. There is no guarding or rebound.     Comments: Abdomen is soft, tenderness to palpation in the suprapubic and LLQ, non-distended, no rebound/guarding, certainly no peritonitis   Genitourinary:    Comments: Deferred Musculoskeletal:  General: Normal range of motion.     Right lower leg: No edema.     Left lower leg: No edema.  Skin:    General: Skin is warm and dry.     Coloration: Skin is not pale.     Findings: No erythema.  Neurological:     General: No focal deficit present.     Mental Status: He is alert and oriented to person, place, and time.  Psychiatric:        Mood and Affect: Mood normal.        Behavior: Behavior normal.      Labs: No new labs or imaging   Imaging studies:  No new pertinent imaging studies at the start of the visit   Assessment/Plan:  44 y.o. male with return of lower abdominal pain concerning for recurrence of diverticulitis, complicated by recent admission for similar episodes of complicated diverticulitis on 06/21.   - I will obtain STAT CT Abdomen/Pelvis to ensure no worsening or return of his disease process  - I will restart PO Abx Cipro + Flagyl given penicillin allergy) for 10 days  - He is certainly not peritonitic or toxic and I do not think he warrants any admission at this time  - I again discussed his disease process at length and he understands that we are recommending elective sigmoid colectomy once recovered from these insults and will require colonoscopy in the future prior to proceeding with this. He understands and is in agreement  - I will tentatively plan to see him in 2 weeks pending  CT results  All of the above recommendations were discussed with the patient, and all of patient's questions were answered to his expressed satisfaction.  Thank you for the opportunity to participate in this patient's care.  Face-to-face time spent with the patient and care providers was 45 minutes, with more than 50% of the time spent counseling, educating, and coordinating care of the patient.   -- Lynden Oxford, PA-C [facility] 04/30/2020, 12:19 PM [phone] M-F: 7am - 4pm

## 2020-05-06 ENCOUNTER — Other Ambulatory Visit: Payer: Self-pay | Admitting: Physician Assistant

## 2020-05-06 ENCOUNTER — Other Ambulatory Visit: Payer: Self-pay | Admitting: Family Medicine

## 2020-05-06 DIAGNOSIS — K219 Gastro-esophageal reflux disease without esophagitis: Secondary | ICD-10-CM

## 2020-05-06 DIAGNOSIS — F419 Anxiety disorder, unspecified: Secondary | ICD-10-CM

## 2020-05-06 NOTE — Telephone Encounter (Signed)
Medication Refill - Medication: Sertraline   Has the patient contacted their pharmacy? Yes.   (Agent: If no, request that the patient contact the pharmacy for the refill.) (Agent: If yes, when and what did the pharmacy advise?)  Preferred Pharmacy (with phone number or street name):  [facility] [address] - [place], [place] - [address] [facility]  [address] [place] [address]  Phone: [phone] Fax: [phone]  Hours: Not open 24 hours     Agent: Please be advised that RX refills may take up to 3 business days. We ask that you follow-up with your pharmacy.

## 2020-05-06 NOTE — Telephone Encounter (Signed)
Requested Prescriptions  Pending Prescriptions Disp Refills  . pantoprazole (PROTONIX) 20 MG tablet [Pharmacy Med Name: PANTOPRAZOLE SOD DR 20 MG TAB] 90 tablet 3    Sig: TAKE ONE TABLET BY MOUTH DAILY     Gastroenterology: Proton Pump Inhibitors Passed - 05/06/2020 10:12 AM      Passed - Valid encounter within last 12 months    Recent Outpatient Visits          1 month ago Annual physical exam   [facility] Osvaldo Angst M, PA-C   2 years ago Gastroesophageal reflux disease without esophagitis   [facility] Malva Limes, MD   3 years ago Chronic chest wall pain   [facility] [place], [place]   4 years ago Internal hemorrhoids   [facility], [place], [place]   4 years ago Gastroesophageal reflux disease without esophagitis   [facility], Demetrios Isaacs, MD      Future Appointments            In 3 days Duanne Guess, MD [facility]   In 10 months Trey Sailors, PA-C [facility]

## 2020-05-07 NOTE — Telephone Encounter (Signed)
L.O.V. was on 03/27/2020.

## 2020-05-08 MED ORDER — SERTRALINE HCL 50 MG PO TABS: 50.0000 mg | ORAL_TABLET | Freq: Every day | ORAL | 4 refills | Status: DC

## 2020-05-09 ENCOUNTER — Other Ambulatory Visit: Payer: Self-pay

## 2020-05-09 ENCOUNTER — Encounter: Payer: Self-pay | Admitting: General Surgery

## 2020-05-09 ENCOUNTER — Ambulatory Visit (INDEPENDENT_AMBULATORY_CARE_PROVIDER_SITE_OTHER): Payer: No Typology Code available for payment source | Admitting: General Surgery

## 2020-05-09 DIAGNOSIS — R109 Unspecified abdominal pain: Secondary | ICD-10-CM

## 2020-05-09 MED ORDER — CIPROFLOXACIN HCL 500 MG PO TABS: 500.0000 mg | ORAL_TABLET | Freq: Two times a day (BID) | ORAL | 0 refills | Status: AC

## 2020-05-09 MED ORDER — METRONIDAZOLE 500 MG PO TABS: 500.0000 mg | ORAL_TABLET | Freq: Three times a day (TID) | ORAL | 0 refills | Status: AC

## 2020-05-09 NOTE — Progress Notes (Signed)
Patient ID: Derek Ramirez, male   DOB: January 11, 1976, 44 y.o.   MRN: [id number]  Chief Complaint  Patient presents with  . Follow-up     2 week hospital f/u diverticulitis    HPI Derek Ramirez is a 44 y.o. male.   He was hospitalized at the end of June with acute perforated diverticulitis.  He was treated conservatively with IV antibiotics.  He presented to our clinic on July 13 and was seen by our physicians assistant.  He had low-grade fevers as well as abdominal pain.  A CT scan was performed with the results copied here:   CLINICAL DATA:  Persistent lower abdominal pain. Perforated diverticulitis on CT of 04/08/2020.  EXAM: CT ABDOMEN AND PELVIS WITH CONTRAST  TECHNIQUE: Multidetector CT imaging of the abdomen and pelvis was performed using the standard protocol following bolus administration of intravenous contrast.  CONTRAST:  OMNIPAQUE IOHEXOL 300 MG/ML  SOLN  COMPARISON:  04/08/2020  FINDINGS: Lower chest: Unremarkable.  Hepatobiliary: No suspicious focal abnormality within the liver parenchyma. There is no evidence for gallstones, gallbladder wall thickening, or pericholecystic fluid. No intrahepatic or extrahepatic biliary dilation.  Pancreas: No focal mass lesion. No dilatation of the main duct. No intraparenchymal cyst. No peripancreatic edema.  Spleen: No splenomegaly. No focal mass lesion.  Adrenals/Urinary Tract: No adrenal nodule or mass. Kidneys unremarkable. No evidence for hydroureter. Tiny gas bubbles identified in the bladder lumen, presumably from recent instrumentation.  Stomach/Bowel: Stomach is unremarkable. No gastric wall thickening. No evidence of outlet obstruction. Duodenum is normally positioned as is the ligament of Treitz. No small bowel wall thickening. No small bowel dilatation. The terminal ileum is normal. The appendix is normal. Right and transverse segments of the colon are unremarkable. Ill-defined wall thickening is seen  in the distal sigmoid colon with pericolonic edema/inflammation. The volume of extraluminal gas adjacent to the sigmoid colon has increased in the interval (77/2). Gas tracks down along the dome of bladder.  Vascular/Lymphatic: No abdominal aortic aneurysm. No abdominal lymphadenopathy No pelvic sidewall lymphadenopathy.  Reproductive: The prostate gland and seminal vesicles are unremarkable.  Other: No intraperitoneal free fluid. The intraperitoneal free air seen on the previous study has resolved in the interval.  Musculoskeletal: No worrisome lytic or sclerotic osseous abnormality.  IMPRESSION: 1. Although intraperitoneal free air seen previously has resolved in the interval, there is and increased volume of extraluminal gas trapped adjacent to the abnormal appearing distal sigmoid colon and tracking down along the dome of bladder. Imaging features remain compatible with perforated diverticulitis although lack of resolution and the relatively focal wall thickening in this region raise the question of perforated neoplasm. No evidence for para colonic abscess. 2. Tiny gas bubbles in the bladder lumen, presumably from recent Instrumentation.  Another course of antibiotics was prescribed.  Derek Ramirez is here today to discuss surgical planning, as he has had multiple episodes of diverticulitis.  He states that his symptoms have once again resolved.  He denies any fevers or chills.  No nausea or vomiting.  No abdominal pain at this time.  His last colonoscopy was 5 to 6 years ago, per his report.  He was being evaluated for irritable bowel syndrome versus inflammatory bowel disease.  He was recently diagnosed with severe obstructive sleep apnea and has just gotten a CPAP machine.   Past Medical History:  Diagnosis Date  . Diverticulitis   . History of chicken pox   . History of IBS   . History of measles   .  Panic disorder     Past Surgical History:  Procedure Laterality  Date  . BACK SURGERY    . WISDOM TOOTH EXTRACTION      Family History  Problem Relation Age of Onset  . Diabetes Maternal Grandmother   . Diabetes Maternal Grandfather   . Lung cancer Paternal Grandmother        Due to Asbestos  . Lung cancer Paternal Grandfather        due to Asbestos  . Heart Problems Paternal Grandfather     Social History Social History   Tobacco Use  . Smoking status: Former Smoker    Packs/day: 2.00    Years: 16.00    Pack years: 32.00    Types: Cigarettes    Quit date: 10/19/2010    Years since quitting: 9.5  . Smokeless tobacco: Never Used  Substance Use Topics  . Alcohol use: No    Alcohol/week: 0.0 standard drinks  . Drug use: No    Allergies  Allergen Reactions  . Penicillins Anaphylaxis    Has patient had a PCN reaction causing immediate rash, facial/tongue/throat swelling, SOB or lightheadedness with hypotension: Yes Has patient had a PCN reaction causing severe rash involving mucus membranes or skin necrosis: No Has patient had a PCN reaction that required hospitalization. Patient not sure Has patient had a PCN reaction occurring within the last 10 years: No If all of the above answers are "NO", then may proceed with Cephalosporin use.  . Shellfish Allergy     Throat swells and can't breath    Current Outpatient Medications  Medication Sig Dispense Refill  . albuterol (VENTOLIN HFA) 108 (90 Base) MCG/ACT inhaler Inhale 2 puffs into the lungs every 6 (six) hours as needed for wheezing or shortness of breath. 8 g 1  . Fluticasone-Salmeterol (ADVAIR) 100-50 MCG/DOSE AEPB Inhale 1 puff into the lungs 2 (two) times daily. 1 each 3  . pantoprazole (PROTONIX) 20 MG tablet TAKE ONE TABLET BY MOUTH DAILY 90 tablet 3  . sertraline (ZOLOFT) 50 MG tablet Take 1 tablet (50 mg total) by mouth daily. 90 tablet 4  . ciprofloxacin (CIPRO) 500 MG tablet Take 1 tablet (500 mg total) by mouth 2 (two) times daily for 21 days. 42 tablet 0  . metroNIDAZOLE  (FLAGYL) 500 MG tablet Take 1 tablet (500 mg total) by mouth 3 (three) times daily for 21 days. 63 tablet 0   No current facility-administered medications for this visit.    Review of Systems Review of Systems  All other systems reviewed and are negative.   Blood pressure 130/85, pulse 96, temperature 98.3 F (36.8 C), temperature source Oral, height 5\' 11"  (1.803 m), weight 233 lb 3.2 oz (105.8 kg), SpO2 97 %. Body mass index is 32.52 kg/m.  Physical Exam Physical Exam Vitals reviewed.  Constitutional:      General: He is not in acute distress.    Appearance: He is obese.  HENT:     Head: Normocephalic and atraumatic.     Nose:     Comments: Covered with a mask    Mouth/Throat:     Comments: Covered with a mask Eyes:     General: No scleral icterus.       Right eye: No discharge.        Left eye: No discharge.  Cardiovascular:     Rate and Rhythm: Normal rate and regular rhythm.  Pulmonary:     Effort: Pulmonary effort is normal.  Breath sounds: Normal breath sounds.  Abdominal:     General: Bowel sounds are normal.     Palpations: Abdomen is soft.     Tenderness: There is no abdominal tenderness. There is no guarding or rebound.  Genitourinary:    Comments: Deferred. Musculoskeletal:        General: No swelling or deformity.     Cervical back: No rigidity.  Lymphadenopathy:     Cervical: No cervical adenopathy.  Skin:    General: Skin is warm and dry.  Neurological:     General: No focal deficit present.     Mental Status: He is alert and oriented to person, place, and time.  Psychiatric:        Mood and Affect: Mood normal.        Behavior: Behavior normal.     Data Reviewed I reviewed the clinic notes from April 30, 2020 as well as a CT scan obtained as a result with findings discussed above.  Assessment Had multiple episodes of diverticulitis.  The most recent was somewhat complicated with perforation.  The CT scan obtained about 2 weeks ago does  raise some concern for possible colovesical fistula in addition, he has not had a colonoscopy for several years.  In the setting of perforation, this would be contraindicated, but certainly would be preferable to have obtained prior to operative intervention.  I have recommended that he undergo partial colectomy.   Plan Due to the degree of inflammation and presence of perforation, I would like to try to defer this another month or so, to allow the inflammation to better resolve.  We will put him on additional antibiotics for now and obtain a repeat CT scan in about 3 weeks.  If there is continued concern for colovesical fistula, we will ask urology to evaluate him and request their assistance intraoperatively.  If there is no evidence of ongoing perforation, we will arrange for a colonoscopy prior to surgery. I discussed the risks of surgery with Derek Ramirez and his wife.  These include, but are not limited to, bleeding, infection, damage to surrounding structures, ureteral injury, need for bladder resection or repair, unanticipated surgical findings necessitating additional procedures, anastomotic leak, inability to achieve anastomosis, need for temporary or permanent ostomy.  We will await the results of the CT scan and make further plans pending those findings.   Duanne Guess 05/09/2020, 12:55 PM

## 2020-05-09 NOTE — Patient Instructions (Addendum)
Dr.Cannon recommends patient to continue Antibiotic Treatment until surgery. Patient will have a repeat CT Abdomen/Pelvis with Contrast.  Patient is scheduled on Monday August 2nd at 1:00pm with arrival time at 12:45pm at [facility] on [address]  Patient is not to have anything to eat or drink 4 hours prior to scan.  Diverticulitis  Diverticulitis is infection or inflammation of small pouches (diverticula) in the colon that form due to a condition called diverticulosis. Diverticula can trap stool (feces) and bacteria, causing infection and inflammation. Diverticulitis may cause severe stomach pain and diarrhea. It may lead to tissue damage in the colon that causes bleeding. The diverticula may also burst (rupture) and cause infected stool to enter other areas of the abdomen. Complications of diverticulitis can include:  Bleeding.  Severe infection.  Severe pain.  Rupture (perforation) of the colon.  Blockage (obstruction) of the colon. What are the causes? This condition is caused by stool becoming trapped in the diverticula, which allows bacteria to grow in the diverticula. This leads to inflammation and infection. What increases the risk? You are more likely to develop this condition if:  You have diverticulosis. The risk for diverticulosis increases if: ? You are overweight or obese. ? You use tobacco products. ? You do not get enough exercise.  You eat a diet that does not include enough fiber. High-fiber foods include fruits, vegetables, beans, nuts, and whole grains. What are the signs or symptoms? Symptoms of this condition may include:  Pain and tenderness in the abdomen. The pain is normally located on the left side of the abdomen, but it may occur in other areas.  Fever and chills.  Bloating.  Cramping.  Nausea.  Vomiting.  Changes in bowel routines.  Blood in your stool. How is this diagnosed? This condition is diagnosed based  on:  Your medical history.  A physical exam.  Tests to make sure there is nothing else causing your condition. These tests may include: ? Blood tests. ? Urine tests. ? Imaging tests of the abdomen, including X-rays, ultrasounds, MRIs, or CT scans. How is this treated? Most cases of this condition are mild and can be treated at home. Treatment may include:  Taking over-the-counter pain medicines.  Following a clear liquid diet.  Taking antibiotic medicines by mouth.  Rest. More severe cases may need to be treated at a hospital. Treatment may include:  Not eating or drinking.  Taking prescription pain medicine.  Receiving antibiotic medicines through an IV tube.  Receiving fluids and nutrition through an IV tube.  Surgery. When your condition is under control, your health care provider may recommend that you have a colonoscopy. This is an exam to look at the entire large intestine. During the exam, a lubricated, bendable tube is inserted into the anus and then passed into the rectum, colon, and other parts of the large intestine. A colonoscopy can show how severe your diverticula are and whether something else may be causing your symptoms. Follow these instructions at home: Medicines  Take over-the-counter and prescription medicines only as told by your health care provider. These include fiber supplements, probiotics, and stool softeners.  If you were prescribed an antibiotic medicine, take it as told by your health care provider. Do not stop taking the antibiotic even if you start to feel better.  Do not drive or use heavy machinery while taking prescription pain medicine. General instructions   Follow a full liquid diet or another diet as directed by your health care  provider. After your symptoms improve, your health care provider may tell you to change your diet. He or she may recommend that you eat a diet that contains at least 25 g (25 grams) of fiber daily. Fiber makes  it easier to pass stool. Healthy sources of fiber include: ? Berries. One cup contains 4-8 grams of fiber. ? Beans or lentils. One half cup contains 5-8 grams of fiber. ? Green vegetables. One cup contains 4 grams of fiber.  Exercise for at least 30 minutes, 3 times each week. You should exercise hard enough to raise your heart rate and break a sweat.  Keep all follow-up visits as told by your health care provider. This is important. You may need a colonoscopy. Contact a health care provider if:  Your pain does not improve.  You have a hard time drinking or eating food.  Your bowel movements do not return to normal. Get help right away if:  Your pain gets worse.  Your symptoms do not get better with treatment.  Your symptoms suddenly get worse.  You have a fever.  You vomit more than one time.  You have stools that are bloody, black, or tarry. Summary  Diverticulitis is infection or inflammation of small pouches (diverticula) in the colon that form due to a condition called diverticulosis. Diverticula can trap stool (feces) and bacteria, causing infection and inflammation.  You are at higher risk for this condition if you have diverticulosis and you eat a diet that does not include enough fiber.  Most cases of this condition are mild and can be treated at home. More severe cases may need to be treated at a hospital.  When your condition is under control, your health care provider may recommend that you have an exam called a colonoscopy. This exam can show how severe your diverticula are and whether something else may be causing your symptoms. This information is not intended to replace advice given to you by your health care provider. Make sure you discuss any questions you have with your health care provider. Document Revised: 09/17/2017 Document Reviewed: 11/07/2016 Elsevier Patient Education  2020 [employer].

## 2020-05-14 ENCOUNTER — Other Ambulatory Visit: Payer: Self-pay

## 2020-05-14 ENCOUNTER — Telehealth: Payer: Self-pay | Admitting: General Surgery

## 2020-05-14 DIAGNOSIS — R197 Diarrhea, unspecified: Secondary | ICD-10-CM

## 2020-05-14 NOTE — Telephone Encounter (Signed)
Spoke with patient- he will come to get c-diff kit from lab tomorrow. Patient instructed to use OTC analgesics.

## 2020-05-14 NOTE — Telephone Encounter (Signed)
Incoming call from patient.  He is still having lower abdominal pain and discomfort with diarrhea.  He is still taking his antibiotics and CT is pending.  He is asking if there is anything else in the interim he can do to get some relief from the above.  Please call him.  Thank you.

## 2020-05-14 NOTE — Telephone Encounter (Signed)
If he is still having diarrhea, it could be from the antibiotics.  We should check a clostridium dificile on him.  That could also potentially be a cause for persistent pain.  He can use OTC analgesics for pain, in the interim.  Thanks!

## 2020-05-15 ENCOUNTER — Other Ambulatory Visit
Admission: RE | Admit: 2020-05-15 | Discharge: 2020-05-15 | Disposition: A | Discharge status: Home / Self Care | Payer: No Typology Code available for payment source | Source: Ambulatory Visit | Attending: General Surgery | Admitting: General Surgery

## 2020-05-15 DIAGNOSIS — R197 Diarrhea, unspecified: Secondary | ICD-10-CM | POA: Insufficient documentation

## 2020-05-15 LAB — C DIFFICILE QUICK SCREEN W PCR REFLEX
C Diff antigen: NEGATIVE
C Diff interpretation: NOT DETECTED
C Diff toxin: NEGATIVE

## 2020-05-15 NOTE — Telephone Encounter (Signed)
Spoke with patient- negative C-Diff, patient can try imodium or kaopectate for the diarrhea. Patient stated he started taking probiotic yesterday.

## 2020-05-15 NOTE — Telephone Encounter (Signed)
-----   Message from Duanne Guess, MD sent at 05/15/2020 12:13 PM EDT ----- He can use an over the counter anti-diarrheal medication, like immodium or kaopectate to help with the diarrhea.  He should also be taking a probiotic. ----- Message ----- From: Cameron Proud, CMA Sent: 05/15/2020  11:50 AM EDT To: Duanne Guess, MD  Please see c-diff results- please advise.

## 2020-05-20 ENCOUNTER — Ambulatory Visit: Payer: No Typology Code available for payment source

## 2020-05-28 ENCOUNTER — Other Ambulatory Visit: Payer: Self-pay

## 2020-05-28 ENCOUNTER — Ambulatory Visit
Admission: RE | Admit: 2020-05-28 | Discharge: 2020-05-28 | Disposition: A | Discharge status: Home / Self Care | Payer: No Typology Code available for payment source | Source: Ambulatory Visit | Attending: General Surgery | Admitting: General Surgery

## 2020-05-28 DIAGNOSIS — R109 Unspecified abdominal pain: Secondary | ICD-10-CM | POA: Diagnosis not present

## 2020-05-28 MED ORDER — IOHEXOL 300 MG/ML  SOLN
100.0000 mL | Freq: Once | INTRAMUSCULAR | Status: AC | PRN
  Administered 2020-05-28: 100 mL via INTRAVENOUS

## 2020-05-29 ENCOUNTER — Telehealth: Payer: Self-pay | Admitting: General Surgery

## 2020-05-29 ENCOUNTER — Telehealth: Payer: Self-pay | Admitting: Emergency Medicine

## 2020-05-29 ENCOUNTER — Encounter: Payer: Self-pay | Admitting: General Surgery

## 2020-05-29 ENCOUNTER — Other Ambulatory Visit: Payer: Self-pay | Admitting: Emergency Medicine

## 2020-05-29 DIAGNOSIS — K5720 Diverticulitis of large intestine with perforation and abscess without bleeding: Secondary | ICD-10-CM

## 2020-05-29 NOTE — Telephone Encounter (Signed)
Spoke with patient and explained that after diverticulitis episode need to give time for things.  to calm down so that when the scope is passed through it will not perforate the bowel.   he is to call the office after he has his colonoscopy.

## 2020-05-29 NOTE — Telephone Encounter (Signed)
-----   Message from Duanne Guess, MD sent at 05/29/2020  9:39 AM EDT ----- Regarding: urology and GI referrals Can we please refer Mr. Derek Ramirez to GI for a pre-op colonoscopy (diverticulitis) and to Urology to evaluate for colovesical fistula prior to surgery. Thanks! --Kem Boroughs

## 2020-05-29 NOTE — Telephone Encounter (Signed)
Referral to [facility] placed.   Patient made aware of the above and advised both clinic will contact him to schedule appts. Advised to call the office if he does not hear from them. Pt voiced understanding.

## 2020-05-29 NOTE — Telephone Encounter (Signed)
Patient is calling and said GI couldn't get him in for his appt until sept. 20th and said Dr. America Brown would like for him to be sooner. Please call patient and advise.

## 2020-06-05 ENCOUNTER — Other Ambulatory Visit: Payer: Self-pay

## 2020-06-05 ENCOUNTER — Telehealth: Payer: Self-pay | Admitting: General Surgery

## 2020-06-05 ENCOUNTER — Encounter: Payer: Self-pay | Admitting: Urology

## 2020-06-05 ENCOUNTER — Ambulatory Visit (INDEPENDENT_AMBULATORY_CARE_PROVIDER_SITE_OTHER): Payer: No Typology Code available for payment source | Admitting: Urology

## 2020-06-05 VITALS — BP 125/80 | HR 91 | Ht 71.0 in | Wt 230.0 lb

## 2020-06-05 DIAGNOSIS — N321 Vesicointestinal fistula: Secondary | ICD-10-CM | POA: Diagnosis not present

## 2020-06-05 MED ORDER — CIPROFLOXACIN HCL 500 MG PO TABS
500.0000 mg | ORAL_TABLET | Freq: Two times a day (BID) | ORAL | 0 refills | Status: AC
Start: 2020-06-05 — End: 2020-06-19

## 2020-06-05 MED ORDER — METRONIDAZOLE 500 MG PO TABS
500.0000 mg | ORAL_TABLET | Freq: Three times a day (TID) | ORAL | 0 refills | Status: AC
Start: 2020-06-05 — End: 2020-06-19

## 2020-06-05 NOTE — Progress Notes (Signed)
06/05/20 4:57 PM   [name] 1975/11/19 [id number]  CC: Evaluate colovesical fistula  HPI:  I saw Mr.Kobrin in urology clinic today for evaluation of a colovesical fistula. He is a 44 year old healthy male who is followed by Dr. Lady Gary for recurrent episodes of diverticulitis. Has had multiple episodes since April 2020, including 5 CT scans during that time. The CT and July 2021 showed gas in the bladder that was suspicious for a colovesical fistula. He has had some mild urinary symptoms of urgency, frequency, and dysuria during his episodes of diverticulitis, but these have resolved previously with antibiotics. He denies any history of debris in the urine, pneumaturia, or gross hematuria. He has a distant 10-pack-year smoking history and quit in 2012. He is currently awaiting colonoscopy prior to bowel resection with general surgery for his recurrent sigmoid diverticulitis. He remains on antibiotics.  He denies any prior abdominal surgeries. There is no prior history of microscopic or gross hematuria or UTIs.  PMH: Past Medical History:  Diagnosis Date  . Diverticulitis   . History of chicken pox   . History of IBS   . History of measles   . Panic disorder     Surgical History: Past Surgical History:  Procedure Laterality Date  . BACK SURGERY    . WISDOM TOOTH EXTRACTION      Family History: Family History  Problem Relation Age of Onset  . Diabetes Maternal Grandmother   . Diabetes Maternal Grandfather   . Lung cancer Paternal Grandmother        Due to Asbestos  . Lung cancer Paternal Grandfather        due to Asbestos  . Heart Problems Paternal Grandfather     Social History:  reports that he quit smoking about 9 years ago. His smoking use included cigarettes. He has a 32.00 pack-year smoking history. He has never used smokeless tobacco. He reports that he does not drink alcohol and does not use drugs.  Physical Exam: BP 125/80   Pulse 91   Ht 5\' 11"  (1.803 m)    Wt 230 lb (104.3 kg)   BMI 32.08 kg/m    Constitutional:  Alert and oriented, No acute distress. Cardiovascular: No clubbing, cyanosis, or edema. Respiratory: Normal respiratory effort, no increased work of breathing. GI: Abdomen is soft, nontender, nondistended, no abdominal masses  Laboratory Data: Reviewed  Pertinent Imaging: I have personally reviewed the multiple CT scans from April through August 2021. Notable for gas in the bladder consistent with a colovesical fistula in July 2021, and no gas in the bladder in August 2021. There is significant sigmoid diverticulitis with inflammation at the dome of the bladder consistent with a possible colovesical fistula. No hydronephrosis or stone disease noted.  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  We discussed my role in his disease process as being involved with surgery for any help with dissection around the bladder or a possible repair of the bladder intraoperatively if any large defect is noted after the bowel has been taken down. We discussed the possible need for a catheter after surgery for 1 to 2 weeks pending extent of bladder repair if needed at all. We discussed that many times after the bowel is removed, there is no leakage of the bladder and no prolonged catheter or  repair is needed.  I messaged Dr. Lady Gary via epic and will be available on the day of surgery to be available if needed for bladder repair No need for cystoscopy prior to surgery from a urology perspective  Legrand Rams, MD 06/05/2020  [facility] [address], [address] [place], [place] [address] [phone]

## 2020-06-05 NOTE — Telephone Encounter (Signed)
Patient called requesting refill on Antibiotics. Per Dr.Cannon advised me to refill patient prescriptions for Cipro 500 mg twice a day and Flagyl 500 mg three times a day for 14 days to patient local pharmacy. Patient had questions about if our office could have his colonoscopy expedited to before 07/08/20. Patient states he does not want to go to an appointment, just to have a yes or no answer as to if he can have the colonoscopy. He would just rather go ahead and schedule the colonoscopy.   Spoke with CMA Ginger at [employer] to see if patient would have to keep scheduled appointment before proceeding with having his colonoscopy done. She states patient would need to keep appointment as the providers use that appointment to familiarize themselves with the patient and their condition, but also at that appointment they discussed surgery and getting scheduled for surgery. Patient was notified and made aware of recommendations. Patient verbalized understanding and had no further questions.

## 2020-06-05 NOTE — Telephone Encounter (Signed)
Patient called and is asking if he could get a refill on his antibiotics. Please call patient and advise.

## 2020-06-19 ENCOUNTER — Telehealth: Payer: Self-pay

## 2020-06-19 NOTE — Telephone Encounter (Signed)
Patient calling and asking if he needs to have his antibiotics refilled as he will not be seeing gastroenterology until September 20th.

## 2020-06-19 NOTE — Telephone Encounter (Signed)
Patient reports that he is not having any pain, fevers, or chills. He is aware to call if any of these things start up.

## 2020-06-19 NOTE — Telephone Encounter (Signed)
If he is not having any pain or fevers, let's hold off. If either of those things is present or occur while he is off antibiotics, then we should restart them.  Thx! JC

## 2020-06-19 NOTE — Telephone Encounter (Signed)
Patient called asking for antibiotics to be refilled. Told patient that clinical team would call him back to address as soon as possible.

## 2020-06-20 ENCOUNTER — Ambulatory Visit (INDEPENDENT_AMBULATORY_CARE_PROVIDER_SITE_OTHER): Payer: No Typology Code available for payment source | Admitting: Surgery

## 2020-06-20 ENCOUNTER — Encounter: Payer: Self-pay | Admitting: Surgery

## 2020-06-20 ENCOUNTER — Other Ambulatory Visit: Payer: Self-pay

## 2020-06-20 VITALS — BP 141/90 | HR 105 | Temp 98.3°F | Ht 71.0 in | Wt 232.6 lb

## 2020-06-20 DIAGNOSIS — K5720 Diverticulitis of large intestine with perforation and abscess without bleeding: Secondary | ICD-10-CM

## 2020-06-20 MED ORDER — METRONIDAZOLE 500 MG PO TABS
500.0000 mg | ORAL_TABLET | Freq: Three times a day (TID) | ORAL | 0 refills | Status: AC
Start: 2020-06-20 — End: 2020-07-04

## 2020-06-20 MED ORDER — CIPROFLOXACIN HCL 500 MG PO TABS
500.0000 mg | ORAL_TABLET | Freq: Two times a day (BID) | ORAL | 0 refills | Status: AC
Start: 2020-06-20 — End: 2020-07-04

## 2020-06-20 NOTE — Progress Notes (Addendum)
Surgical Clinic Progress/Follow-up Note   HPI:  44 y.o. Male presents to clinic for for recurring symptoms involving pain in the right lower quadrant, and some additional mucus in his stool.  He is a little disappointed that he cannot get his colonoscopy done more expediently, I again I explained to him the need for ruling out any other pathology prior to any elective operation.  He is concerned that over the long weekend he may develop worsening symptoms and does not want to end up in the emergency room again.  He admits he would feel much better just having some Cipro Flagyl on a shelf, because apparently he is responded well to this and seems to have relapses his symptoms he is off the antibiotics.  He reports he has not had a solid/firm formed bowel movement for months.  He denies fevers and chills.  And reports/admits that his pain is not severe right now, admittedly it is probably just taunting him.  Reports heartburn.  Denies N/V, or SOB.  Reports painful urination on initiation.  Reports urinary urgency and frequency.  Review of Systems:   ENT: denies sore throat, hearing problems  Respiratory: denies wheezing  Cardiovascular: denies chest pain, palpitations   Skin: Denies any other rashes or skin discolorations  Vital Signs:  BP (!) 141/90   Pulse (!) 105   Temp 98.3 F (36.8 C) (Oral)   Ht 5\' 11"  (1.803 m)   Wt 232 lb 9.6 oz (105.5 kg)   SpO2 98%   BMI 32.44 kg/m    Physical Exam:  Constitutional:  -- Normal/Obese body habitus  -- Awake, alert, and oriented x3  Pulmonary:  -- No crackles -- Equal breath sounds bilaterally -- Breathing non-labored at rest Cardiovascular:  -- S1, S2 present  -- No pericardial rubs  Gastrointestinal:  -- Soft and non-distended, non-tender/with mild tenderness to palpation in the suprapubic area and left lower quadrant, no guarding/rebound tenderness Musculoskeletal / Integumentary:  -- Wounds or skin discoloration: None appreciated --  Extremities: B/L UE and LE FROM, hands and feet warm, no edema   Laboratory studies: Nothing recent.  Imaging: No new pertinent imaging available for review, I did review his last CT scan, not quite certain why he has discomfort in the right lower quadrant as the sigmoid does not significantly appear to reach to the right.    Assessment:  44 y.o. yo Male with a problem list including...  Patient Active Problem List   Diagnosis Date Noted  . Diverticulitis of large intestine with perforation without bleeding 04/08/2020  . Allergic rhinitis 10/31/2015  . Anxiety 10/31/2015  . Chest pain 10/31/2015  . GERD (gastroesophageal reflux disease) 10/31/2015  . Heartburn 10/31/2015  . Palpitations 10/31/2015  . Panic disorder 10/31/2015  . 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 Headache, tension-type 03/01/2008  . Tobacco use 03/17/2006  . 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  Plan:              -We discussed the advantage of having antibiotics at home for him to utilize should his symptoms worsen/recur.  -We also discussed some of the anti-inflammatory medications that GI docs used for bowel inflammation, though there is not a whole lot of evidence that is helpful this point.  -We also discussed and I encouraged him to utilize some fiber supplementation to try to restore some semblance of normal bowel activity.  I do not see any evidence of  stricture to warrant concerns for employing this.  -I also advised to be on-call this weekend should he have concerns that arise.  Campbell Lerner, MD, FACS [place]: [facility] - Partnering for exceptional care. Office: [phone]

## 2020-06-20 NOTE — Patient Instructions (Addendum)
Dr.Rodenberg recommends patient to try increase Fiber intake (Metamucil, Benefiber or Fiber supplement).  Dr.Rodenberg prescribed Cipro and Flagyl to local pharmacy to help prevent flare up.  Patient also advised to increase fluid intake.  High-Fiber Diet Fiber, also called dietary fiber, is a type of carbohydrate that is found in fruits, vegetables, whole grains, and beans. A high-fiber diet can have many health benefits. Your health care provider may recommend a high-fiber diet to help:  Prevent constipation. Fiber can make your bowel movements more regular.  Lower your cholesterol.  Relieve the following conditions: ? Swelling of veins in the anus (hemorrhoids). ? Swelling and irritation (inflammation) of specific areas of the digestive tract (uncomplicated diverticulosis). ? A problem of the large intestine (colon) that sometimes causes pain and diarrhea (irritable bowel syndrome, IBS).  Prevent overeating as part of a weight-loss plan.  Prevent heart disease, type 2 diabetes, and certain cancers. What is my plan? The recommended daily fiber intake in grams (g) includes:  42 g for men age 42 or younger.  42 g for men over age 42.  42 g for women age 42 or younger.  42 g for women over age 42. You can get the recommended daily intake of dietary fiber by:  Eating a variety of fruits, vegetables, grains, and beans.  Taking a fiber supplement, if it is not possible to get enough fiber through your diet. What do I need to know about a high-fiber diet?  It is better to get fiber through food sources rather than from fiber supplements. There is not a lot of research about how effective supplements are.  Always check the fiber content on the nutrition facts label of any prepackaged food. Look for foods that contain 5 g of fiber or more per serving.  Talk with a diet and nutrition specialist (dietitian) if you have questions about specific foods that are recommended or not  recommended for your medical condition, especially if those foods are not listed below.  Gradually increase how much fiber you consume. If you increase your intake of dietary fiber too quickly, you may have bloating, cramping, or gas.  Drink plenty of water. Water helps you to digest fiber. What are tips for following this plan?  Eat a wide variety of high-fiber foods.  Make sure that half of the grains that you eat each day are whole grains.  Eat breads and cereals that are made with whole-grain flour instead of refined flour or white flour.  Eat brown rice, bulgur wheat, or millet instead of white rice.  Start the day with a breakfast that is high in fiber, such as a cereal that contains 5 g of fiber or more per serving.  Use beans in place of meat in soups, salads, and pasta dishes.  Eat high-fiber snacks, such as berries, raw vegetables, nuts, and popcorn.  Choose whole fruits and vegetables instead of processed forms like juice or sauce. What foods can I eat?  Fruits Berries. Pears. Apples. Oranges. Avocado. Prunes and raisins. Dried figs. Vegetables Sweet potatoes. Spinach. Kale. Artichokes. Cabbage. Broccoli. Cauliflower. Green peas. Carrots. Squash. Grains Whole-grain breads. Multigrain cereal. Oats and oatmeal. Brown rice. Barley. Bulgur wheat. Millet. Quinoa. Bran muffins. Popcorn. Rye wafer crackers. Meats and other proteins Navy, kidney, and pinto beans. Soybeans. Split peas. Lentils. Nuts and seeds. Dairy Fiber-fortified yogurt. Beverages Fiber-fortified soy milk. Fiber-fortified orange juice. Other foods Fiber bars. The items listed above may not be a complete list of recommended foods and beverages. Contact  a dietitian for more options. What foods are not recommended? Fruits Fruit juice. Cooked, strained fruit. Vegetables Fried potatoes. Canned vegetables. Well-cooked vegetables. Grains White bread. Pasta made with refined flour. White rice. Meats and  other proteins Fatty cuts of meat. Fried chicken or fried fish. Dairy Milk. Yogurt. Cream cheese. Sour cream. Fats and oils Butters. Beverages Soft drinks. Other foods Cakes and pastries. The items listed above may not be a complete list of foods and beverages to avoid. Contact a dietitian for more information. Summary  Fiber is a type of carbohydrate. It is found in fruits, vegetables, whole grains, and beans.  There are many health benefits of eating a high-fiber diet, such as preventing constipation, lowering blood cholesterol, helping with weight loss, and reducing your risk of heart disease, diabetes, and certain cancers.  Gradually increase your intake of fiber. Increasing too fast can result in cramping, bloating, and gas. Drink plenty of water while you increase your fiber.  The best sources of fiber include whole fruits and vegetables, whole grains, nuts, seeds, and beans. This information is not intended to replace advice given to you by your health care provider. Make sure you discuss any questions you have with your health care provider. Document Revised: 08/09/2017 Document Reviewed: 08/09/2017 Elsevier Patient Education  2020 [employer].

## 2020-06-26 ENCOUNTER — Other Ambulatory Visit: Payer: Self-pay | Admitting: Physician Assistant

## 2020-06-26 DIAGNOSIS — F419 Anxiety disorder, unspecified: Secondary | ICD-10-CM

## 2020-06-26 MED ORDER — SERTRALINE HCL 50 MG PO TABS: 50.0000 mg | ORAL_TABLET | Freq: Every day | ORAL | 4 refills | Status: DC

## 2020-06-28 ENCOUNTER — Telehealth: Payer: Self-pay

## 2020-06-28 NOTE — Telephone Encounter (Signed)
Patient wanted to make Dr.Cannon aware- Stopped the Antibiotic on Wednesday and restarted on Sunday due to  fever, chills, and diarrhea and abdominal pain,   currently feeling better since taking the Antibiotic again. Scheduled to see Dr.Tallihani for consult 07/08/20. He has a week left of the Antibiotics -was instructed to continue taking Antibiotics and keep planned appointment with Dr.Talahhani.  Once he is scheduled to have Colonoscopy patient will call back to schedule follow up with Dr. Lady Gary. Message routed to [facility].

## 2020-06-28 NOTE — Telephone Encounter (Signed)
Ok. Thank you.

## 2020-07-03 ENCOUNTER — Telehealth: Payer: Self-pay | Admitting: General Surgery

## 2020-07-03 NOTE — Telephone Encounter (Signed)
Patient is calling and said he was set to the urology and patient is calling and has some questions about what he needs to do because he has something else going on . Please call patient and advise.

## 2020-07-03 NOTE — Telephone Encounter (Signed)
Patient called to let us know that when he was seen by Urology for possible colovesical fistula he was not having any symptoms. Now he does report some gas from his urethra when urinating. He denies any urinary pain or foul odor, no fever or chills. I let him know there is nothing to be concerned about right now as he has no signs of infection.  He is concerned as to weather or not he can have a colonoscopy with a fistula. I let him know that would be determined by GI when he sees them Monday. I let him know if his symptoms of gas get worse or if he develops any signs of urinary tract infection he needs to call Urology so they can assess him for this.

## 2020-07-08 ENCOUNTER — Ambulatory Visit
Admission: RE | Admit: 2020-07-08 | Discharge: 2020-07-08 | Disposition: A | Discharge status: Home / Self Care | Payer: No Typology Code available for payment source | Source: Ambulatory Visit | Attending: Gastroenterology | Admitting: Gastroenterology

## 2020-07-08 ENCOUNTER — Other Ambulatory Visit: Payer: Self-pay

## 2020-07-08 ENCOUNTER — Telehealth: Payer: Self-pay | Admitting: Physician Assistant

## 2020-07-08 ENCOUNTER — Ambulatory Visit: Payer: No Typology Code available for payment source | Admitting: Gastroenterology

## 2020-07-08 ENCOUNTER — Encounter: Payer: Self-pay | Admitting: Gastroenterology

## 2020-07-08 VITALS — BP 115/82 | HR 83 | Temp 97.8°F | Ht 71.0 in | Wt 231.0 lb

## 2020-07-08 DIAGNOSIS — K5792 Diverticulitis of intestine, part unspecified, without perforation or abscess without bleeding: Secondary | ICD-10-CM

## 2020-07-08 DIAGNOSIS — R1032 Left lower quadrant pain: Secondary | ICD-10-CM

## 2020-07-08 MED ORDER — IOHEXOL 300 MG/ML  SOLN
100.0000 mL | Freq: Once | INTRAMUSCULAR | Status: AC | PRN
  Administered 2020-07-08: 100 mL via INTRAVENOUS

## 2020-07-08 NOTE — Progress Notes (Signed)
Derek Ramirez [address]  [address]  [place], [place] [address]  Main: [phone]  Fax: [phone]   Gastroenterology Consultation  Referring Provider:     Maryella Shivers Primary Care Physician:  Maryella Shivers Reason for Consultation:    Diverticulitis        HPI:    Chief Complaint  Patient presents with  . Diverticulitis    Derek Ramirez is a 44 y.o. y/o male referred for consultation & management  by Dr. Jodi Marble, Lavella Hammock, PA-C.  Patient with multiple episodes of diverticulitis being referred for evaluation of colonoscopy prior to surgical resection.  Initially diagnosed with diverticulitis in April 2020 on CT scan.  May 2021 CT scan also reports uncomplicated diverticulitis.  However, in June 2021 patient had perforated diverticulitis with pneumoperitoneum.  Patient has been conservatively managed with no surgical intervention or drain placement.  He has been taking daily antibiotics since May 2021 and states when he goes off antibiotics, his symptoms return.  These have been managed and prescribed by his surgeons.  Most recent CT scan, in August 2021, reports persistent circumferential wall thickening involving the mid to distal sigmoid colon, with foci of extraluminal air, persistent but improved.  Extraluminal gas also tracks to the urinary bladder and patient has seen her urologist.    Patient reports history of a colonoscopy 4 to 5 years ago.  Records not available of this anywhere.  However, under procedures tab, from September 2014 it does state that it was done by Dr. Mechele Collin, diverticulosis and internal hemorrhoids were reported.  Past Medical History:  Diagnosis Date  . Diverticulitis   . History of chicken pox   . History of IBS   . History of measles   . Panic disorder     Past Surgical History:  Procedure Laterality Date  . BACK SURGERY    . WISDOM TOOTH EXTRACTION      Prior to Admission medications   Medication Sig Start  Date End Date Taking? Authorizing Provider  albuterol (VENTOLIN HFA) 108 (90 Base) MCG/ACT inhaler Inhale 2 puffs into the lungs every 6 (six) hours as needed for wheezing or shortness of breath. 03/27/20  Yes Trey Sailors, PA-C  ciprofloxacin (CIPRO) 500 MG tablet Take 500 mg by mouth 2 (two) times daily.   Yes [provider]  Fluticasone-Salmeterol (ADVAIR) 100-50 MCG/DOSE AEPB Inhale 1 puff into the lungs 2 (two) times daily. 03/27/20  Yes Osvaldo Angst M, PA-C  metroNIDAZOLE (FLAGYL) 500 MG tablet Take 500 mg by mouth 3 (three) times daily.   Yes [provider]  pantoprazole (PROTONIX) 20 MG tablet TAKE ONE TABLET BY MOUTH DAILY 05/06/20  Yes Osvaldo Angst M, PA-C  sertraline (ZOLOFT) 50 MG tablet Take 1 tablet (50 mg total) by mouth daily. 06/26/20  Yes Trey Sailors, PA-C    Family History  Problem Relation Age of Onset  . Diabetes Maternal Grandmother   . Diabetes Maternal Grandfather   . Lung cancer Paternal Grandmother        Due to Asbestos  . Lung cancer Paternal Grandfather        due to Asbestos  . Heart Problems Paternal Grandfather      Social History   Tobacco Use  . Smoking status: Former Smoker    Packs/day: 2.00    Years: 16.00    Pack years: 32.00    Types: Cigarettes    Quit date: 10/19/2010    Years since  quitting: 42  . Smokeless tobacco: Never Used  Substance Use Topics  . Alcohol use: No    Alcohol/week: 0.0 standard drinks  . Drug use: No    Allergies as of 07/08/2020 - Review Complete 07/08/2020  Allergen Reaction Noted  . Penicillins Anaphylaxis 10/19/2015  . Shellfish allergy  10/31/2015    Review of Systems:    All systems reviewed and negative except where noted in HPI.   Physical Exam:  BP 115/82   Pulse 83   Temp 97.8 F (36.6 C) (Oral)   Ht 5\' 11"  (1.803 m)   Wt 231 lb (104.8 kg)   BMI 32.22 kg/m  No LMP for male patient. Psych:  Alert and cooperative. Normal mood and affect. General:   Alert,   Well-developed, well-nourished, pleasant and cooperative in NAD Head:  Normocephalic and atraumatic. Eyes:  Sclera clear, no icterus.   Conjunctiva pink. Ears:  Normal auditory acuity. Nose:  No deformity, discharge, or lesions. Mouth:  No deformity or lesions,oropharynx pink & moist. Neck:  Supple; no masses or thyromegaly. Abdomen:  Normal bowel sounds.  No bruits.  Soft, non-tender and non-distended without masses, hepatosplenomegaly or hernias noted.  No guarding or rebound tenderness.    Msk:  Symmetrical without gross deformities. Good, equal movement & strength bilaterally. Pulses:  Normal pulses noted. Extremities:  No clubbing or edema.  No cyanosis. Neurologic:  Alert and oriented x3;  grossly normal neurologically. Skin:  Intact without significant lesions or rashes. No jaundice. Lymph Nodes:  No significant cervical adenopathy. Psych:  Alert and cooperative. Normal mood and affect.   Labs: CBC    Component Value Date/Time   WBC 9.3 04/09/2020 0601   RBC 4.58 04/09/2020 0601   HGB 13.2 04/09/2020 0601   HGB 15.2 01/22/2015 1900   HCT 38.3 (L) 04/09/2020 0601   HCT 44.1 01/22/2015 1900   PLT 210 04/09/2020 0601   PLT 261 01/22/2015 1900   MCV 83.6 04/09/2020 0601   MCV 83 01/22/2015 1900   MCH 28.8 04/09/2020 0601   MCHC 34.5 04/09/2020 0601   RDW 12.9 04/09/2020 0601   RDW 14.1 01/22/2015 1900   LYMPHSABS 1.5 02/22/2020 0910   MONOABS 1.2 (H) 02/22/2020 0910   EOSABS 0.1 02/22/2020 0910   BASOSABS 0.1 02/22/2020 0910   CMP     Component Value Date/Time   NA 136 04/09/2020 0601   NA 138 06/05/2016 1559   NA 137 01/22/2015 1900   K 3.7 04/09/2020 0601   K 3.6 01/22/2015 1900   CL 105 04/09/2020 0601   CL 103 01/22/2015 1900   CO2 24 04/09/2020 0601   CO2 25 01/22/2015 1900   GLUCOSE 99 04/09/2020 0601   GLUCOSE 103 (H) 01/22/2015 1900   BUN 12 04/09/2020 0601   BUN 15 06/05/2016 1559   BUN 16 01/22/2015 1900   CREATININE 1.11 04/09/2020 0601    CREATININE 1.02 01/22/2015 1900   CALCIUM 8.5 (L) 04/09/2020 0601   CALCIUM 9.9 01/22/2015 1900   PROT 7.6 04/08/2020 1036   PROT 6.9 06/05/2016 1559   PROT 7.5 01/22/2015 1900   ALBUMIN 4.7 04/08/2020 1036   ALBUMIN 4.5 06/05/2016 1559   ALBUMIN 4.7 01/22/2015 1900   AST 22 04/08/2020 1036   AST 20 01/22/2015 1900   ALT 30 04/08/2020 1036   ALT 17 01/22/2015 1900   ALKPHOS 84 04/08/2020 1036   ALKPHOS 84 01/22/2015 1900   BILITOT 1.1 04/08/2020 1036   BILITOT 0.5 06/05/2016 1559  BILITOT 0.4 01/22/2015 1900   GFRNONAA >60 04/09/2020 0601   GFRNONAA >60 01/22/2015 1900   GFRAA >60 04/09/2020 0601   GFRAA >60 01/22/2015 1900    Imaging Studies: No results found.  Assessment and Plan:   Noeh Sparacino is a 44 y.o. 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  If findings are worsening, colonoscopy can be high risk  However, if they are improving, colonoscopy would allow to rule out any underlying lesions that may help surgical plans.   If CT findings are worsening, since patient has had a colonoscopy 4 to 5 years ago, risk of developing a new malignancy since last colonoscopy are low, and it would be best to proceed with surgical interventions if CT looks worse  We will await CT results and discuss with surgery as necessary  Continue close follow-up with surgeons as well    Dr Derek Ramirez  Speech recognition software was used to dictate the above note.

## 2020-07-08 NOTE — Telephone Encounter (Signed)
Colonoscopy records sent to [facility] for continuity of care regarding his diverticulitis.

## 2020-07-08 NOTE — Addendum Note (Signed)
Addended by: Adela Ports on: 07/08/2020 11:08 AM   Modules accepted: Orders

## 2020-07-08 NOTE — Progress Notes (Signed)
I was able to get his Sep 2014 colonoscopy report from his PCP and they have scanned it into the media tab. See images under media dated for today 07/08/2013  Colonoscopy was done by Dr. Mechele Collin in sep 2014 for rectal bleeding. Findings were internal hemorrhoids. Otherwise normal exam, with excellent prep report, extent of exam being cecum and withdrawal time being over 10 mins.   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   Given persistent inflammatory changes and contained perforation similar to previous exam, colonoscopy can worsen this area of contained perforation during a colonoscopy itself.  Given that pt had a colonoscopy 6 yrs ago with no malignancy found, the risk of his diverticulitis being from an underlying malignancy is low as colon cancer is a slow growing process.  Given all the above, risks of colonoscopy (increased risks of perforation and infection in the setting of active diverticulitis, contained perforation, colovesicular fistula) would outweigh the benefit of ruling out malignancy pre-op as risk on malignancy is low in this pt who has already had a colonoscopy within the last 10 yrs as stated above.   If colonoscopy is absolutely necessary pre-op after discussing with surgery, it would entail higher risks and would need to be done with immediate availability of his surgeon if the need arises  Instead, it would be safer to do a  a colonoscopy in 3 months post surgery. I have messaged his surgeon to discuss this as well.  Would recommend proceeding with surgical plans as per them.

## 2020-07-08 NOTE — Addendum Note (Signed)
Addended by: Adela Ports on: 07/08/2020 11:45 AM   Modules accepted: Orders

## 2020-07-09 ENCOUNTER — Telehealth: Payer: Self-pay

## 2020-07-09 ENCOUNTER — Other Ambulatory Visit: Payer: Self-pay | Admitting: Surgery

## 2020-07-09 NOTE — Telephone Encounter (Signed)
Patient was contacted in reference to his CT Scan result and to remind him to go and see his surgeon on 07/11/2020. Patient agreed and had no further questions.

## 2020-07-09 NOTE — Telephone Encounter (Signed)
-----   Message from Pasty Spillers, MD sent at 07/09/2020  1:41 PM EDT ----- Regarding: FW: Derek Ramirez, I have documented my notes in his chart after reviewing his CT scan.  I have also sent a message to his surgeon, as per below.  It looks like he has an appointment with them this week ----- Message ----- From: Duanne Guess, MD Sent: 07/08/2020   4:44 PM EDT To: Pasty Spillers, MD Subject: RE:                                            Randie Heinz. Thanks for seeing him. ----- Message ----- From: Pasty Spillers, MD Sent: 07/08/2020   4:41 PM EDT To: Duanne Guess, MD  Hi  Thanks for referring this pt. I repeated his CT today which continues to show a contained perforation, colovesical fistula and sigmoid thickening. I was able to get his Sep 2014 colonoscopy report - see photos under media from today 07/08/2020. Give colonoscopy exactly 6 yrs ago with no polyps or malignancy, risk of malignancy causing his diverticulitis is low. Therefore, dont think colonoscopy with his persistent abnormalities on CT scan would lead to greater benefit.

## 2020-07-11 ENCOUNTER — Encounter: Payer: Self-pay | Admitting: General Surgery

## 2020-07-11 ENCOUNTER — Other Ambulatory Visit: Payer: Self-pay

## 2020-07-11 ENCOUNTER — Ambulatory Visit (INDEPENDENT_AMBULATORY_CARE_PROVIDER_SITE_OTHER): Payer: No Typology Code available for payment source | Admitting: General Surgery

## 2020-07-11 VITALS — BP 122/80 | HR 81 | Temp 98.2°F | Resp 14 | Ht 71.0 in | Wt 230.0 lb

## 2020-07-11 DIAGNOSIS — K5720 Diverticulitis of large intestine with perforation and abscess without bleeding: Secondary | ICD-10-CM

## 2020-07-11 MED ORDER — METRONIDAZOLE 500 MG PO TABS: ORAL_TABLET | ORAL | 0 refills | Status: DC

## 2020-07-11 MED ORDER — BISACODYL 5 MG PO TBEC: DELAYED_RELEASE_TABLET | ORAL | 0 refills | Status: DC

## 2020-07-11 MED ORDER — POLYETHYLENE GLYCOL 3350 17 GM/SCOOP PO POWD: ORAL | 0 refills | Status: DC

## 2020-07-11 MED ORDER — NEOMYCIN SULFATE 500 MG PO TABS: ORAL_TABLET | ORAL | 0 refills | Status: DC

## 2020-07-11 NOTE — Progress Notes (Signed)
Patient ID: Derek Ramirez, male   DOB: May 13, 1976, 44 y.o.   MRN: [id number]  Chief Complaint  Patient presents with  . Follow-up    discuss surgery    HPI Derek Ramirez is a 44 y.o. male.   I first saw him in June of this year, when he presented to the emergency department with abdominal pain.  My initial history of present illness is copied here:  "44 y.o. male presented to [facility] ED today for abdominal pain. Patient reports that around 830 PM last night he had the abrupt onset of suprapubic and LLQ abdominal pain. This was severe and sharp in nature. The pain has been constant since the onset, does not radiate, and is exacerbated with movement and coughing. He endorses associated nausea, diarrhea, and dysuria with the pain. No fever, chills, cough, CP, SOB, or emesis. He has a history of diverticulitis and believes this is his 4th episode in the last year. Most recently he was seen in the ED on 02/2020 for this and sent home on Bactrim. This feels similar to his previous episodes of diverticulitis but much more severe. He has never seen a Development worker, international aid for this. No previous abdominal surgeries. Last colonoscopy was 5-6 years ago at [facility], but I am unable to find the report in Care Everywhere. On arrival in the ED, he was afebrile and normotensive but tachycardic. Laboratory work up revealed leukocytosis to 17K but was otherwise reassuring. CT Abdomen/Pelvis was concerning for perforated sigmoid diverticulitis with scattered pneumoperitoneum. "  He was treated conservatively with antibiotics.  He was discharged home on a course of oral antibiotics.  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  He continued to have intermittent episodes of abdominal pain, generally coinciding with completion of his antibiotics course.  Several additional CT scans have been performed in the interim, all redemonstrating persistent  inflammation of the sigmoid and likely bladder involvement.  Due to the persistent area suggestive of a contained perforation, gastroenterology declined perform colonoscopy due to the risk of exacerbating the situation.  He also had had a colonoscopy performed 5 to 6 years ago that did not demonstrate any findings that would lead [place] to believe that malignancy was a potential cause for his symptoms.  He is now here today to discuss definitive surgical intervention.  He reports that currently, his abdominal pain is very minimal.  He continues to have diarrhea, which has been persistent since his symptoms initially began.  He also reports urinary urgency as well as occasional episodes of pneumaturia.  He has not had any fevers or chills.  No nausea or vomiting.   Past Medical History:  Diagnosis Date  . Diverticulitis   . History of chicken pox   . History of IBS   . History of measles   . Panic disorder     Past Surgical History:  Procedure Laterality Date  . BACK SURGERY    . WISDOM TOOTH EXTRACTION      Family History  Problem Relation Age of Onset  . Diabetes Maternal Grandmother   . Diabetes Maternal Grandfather   . Lung cancer Paternal Grandmother        Due to Asbestos  . Lung cancer Paternal Grandfather        due to Asbestos  . Heart Problems Paternal Grandfather     Social History Social History   Tobacco Use  . Smoking  status: Former Smoker    Packs/day: 2.00    Years: 16.00    Pack years: 32.00    Types: Cigarettes    Quit date: 10/19/2010    Years since quitting: 9.7  . Smokeless tobacco: Never Used  Substance Use Topics  . Alcohol use: No    Alcohol/week: 0.0 standard drinks  . Drug use: No    Allergies  Allergen Reactions  . Penicillins Anaphylaxis    Has patient had a PCN reaction causing immediate rash, facial/tongue/throat swelling, SOB or lightheadedness with hypotension: Yes Has patient had a PCN reaction causing severe rash involving mucus  membranes or skin necrosis: No Has patient had a PCN reaction that required hospitalization: Patient not sure Has patient had a PCN reaction occurring within the last 10 years: No If all of the above answers are "NO", then may proceed with Cephalosporin use.  . Shellfish Allergy     Throat swells and cant breath    Current Outpatient Medications  Medication Sig Dispense Refill  . albuterol (VENTOLIN HFA) 108 (90 Base) MCG/ACT inhaler Inhale 2 puffs into the lungs every 6 (six) hours as needed for wheezing or shortness of breath. 8 g 1  . Fluticasone-Salmeterol (ADVAIR) 100-50 MCG/DOSE AEPB Inhale 1 puff into the lungs 2 (two) times daily. 1 each 3  . pantoprazole (PROTONIX) 20 MG tablet TAKE ONE TABLET BY MOUTH DAILY 90 tablet 3  . sertraline (ZOLOFT) 50 MG tablet Take 1 tablet (50 mg total) by mouth daily. 90 tablet 4  . bisacodyl (DULCOLAX) 5 MG EC tablet Take all 4 tablets at 8 am the morning prior to your surgery. 4 tablet 0  . metroNIDAZOLE (FLAGYL) 500 MG tablet Take 2 tablets at 8am, take 2 tablets at 2pm, take 2 tablets at 8pm the day prior to surgery. 6 tablet 0  . neomycin (MYCIFRADIN) 500 MG tablet Take 2 tablet at 8am, take 2 tablets at 2pm, and take 2 tablets at 8pm the day prior to your surgery 6 tablet 0  . polyethylene glycol powder (MIRALAX) 17 GM/SCOOP powder Mix full container in 64 ounces of Gatorade or other clear liquid. 238 g 0   No current facility-administered medications for this visit.    Review of Systems Review of Systems  All other systems reviewed and are negative. Or as discussed in the history of present illness.   Blood pressure 122/80, pulse 81, temperature 98.2 F (36.8 C), temperature source Oral, resp. rate 14, height 5\' 11"  (1.803 m), weight 230 lb (104.3 kg), SpO2 98 %. Body mass index is 32.08 kg/m.  Physical Exam Physical Exam Constitutional:      General: He is not in acute distress.    Appearance: Normal appearance. He is obese.  HENT:      Head: Normocephalic and atraumatic.     Nose:     Comments: Covered with a mask    Mouth/Throat:     Comments: Covered with a mask Eyes:     General: No scleral icterus.       Right eye: No discharge.        Left eye: No discharge.     Conjunctiva/sclera: Conjunctivae normal.  Neck:     Comments: No palpable thyroid masses or thyromegaly.  The trachea is midline.  There is no palpable cervical or supraclavicular lymphadenopathy. Cardiovascular:     Rate and Rhythm: Normal rate and regular rhythm.     Pulses: Normal pulses.  Pulmonary:     Effort: Pulmonary  effort is normal.     Breath sounds: Normal breath sounds.  Abdominal:     General: Bowel sounds are normal. There is no distension.     Palpations: Abdomen is soft.     Tenderness: There is no guarding or rebound.     Comments: Mild tenderness to deep palpation in the suprapubic area.  Neurological:     Mental Status: He is alert.     Data Reviewed I personally reviewed multiple CT scans, including scans performed on February 11, 2019, Feb 22, 2020, April 08, 2020, April 30, 2020, May 28, 2020, and July 08, 2020.  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  I reviewed the urology clinic note dated June 05, 2020.  Dr. Richardo Hanks saw him and discussed his role in the operative management.  I also reviewed the gastroenterology note from Dr. Maximino Greenland dated 07/08/2020.  She was able to find the procedure note from the patient's prior colonoscopy and confirmed that it was unlikely that there was malignancy contributing to the disease process.  Assessment This is a 44 year old man who has had multiple episodes of diverticulitis over the past year and a half.  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  I have recommended that he undergo sigmoid colectomy.  We will engage urology for stent placement and potential assistance with bladder repair or perivesical dissection.  I explained to him that there is a potential need for either a Hartmann procedure with end colostomy or a potential diverting ileostomy, if there is any concern about the anastomosis.  Plan I discussed the risks of surgery with Mr. Heideman today.  These include, but are not limited to, bleeding, infection, damage to surrounding structures (bladder, bowel, ureters, blood vessels), need for colostomy, need for diverting ileostomy, anastomotic leak, need for additional operations or procedures.  He had the opportunity to ask a number of questions, all of which were answered to his satisfaction.  Due to the COVID-19 pandemic, we are somewhat limited in our ability to schedule elective surgeries that would require admission to the hospital, however in light of how long he has had diverticulitis and its failure to respond adequately to antibiotic therapy, I think he warrants consideration for expedited treatment.  He has required antibiotics for over 3 months now and he continues to have flares if the antibiotics are discontinued.  We will work on getting him scheduled.    Duanne Guess 07/11/2020, 12:03 PM

## 2020-07-11 NOTE — Patient Instructions (Addendum)
Our surgery scheduler Britta Mccreedy will contact you within the next 24-48 hours to discuss surgery. During that call, she will discuss the preparation prior to surgery and also discuss the different days and times for surgery. Please have the BLUE sheet available when she contacts you. If you have any questions regarding the surgery, please do not hesitate to give our office a call.  Patient was provided with Bowel Prep Instructions and prescriptions sent to pharmacy at today's visit.  Laparoscopic Colectomy Laparoscopic colectomy is surgery to remove part or all of the large intestine (colon). This procedure may be used to treat several conditions, including:  Inflammation and infection of the colon (diverticulitis).  Tumors or masses in the colon.  Inflammatory bowel disease, such as Crohn disease or ulcerative colitis. Colectomy is an option when symptoms cannot be controlled with medicines.  Bleeding from the colon that cannot be controlled by another method.  Blockage or obstruction of the colon. Tell a health care provider about:  Any allergies you have.  All medicines you are taking, including vitamins, herbs, eye drops, creams, and over-the-counter medicines.  Any problems you or family members have had with anesthetic medicines.  Any blood disorders you have.  Any surgeries you have had.  Any medical conditions you have. What are the risks? Generally, this is a safe procedure. However, problems may occur, including:  Infection.  Bleeding.  Allergic reactions to medicines or dyes.  Damage to other structures or organs.  Leaking from where the colon was sewn together.  Future blockage of the small intestines from scar tissue. Another surgery may be needed to repair this.  Needing to convert to an open procedure. Complications such as damage to other organs or excessive bleeding may require the surgeon to convert from a laparoscopic procedure to an open procedure. This  involves making a larger incision in the abdomen. What happens before the procedure? Staying hydrated Follow instructions from your health care provider about hydration, which may include:  Up to 2 hours before the procedure - you may continue to drink clear liquids, such as water, clear fruit juice, black coffee, and plain tea. Eating and drinking restrictions Follow instructions from your health care provider about eating and drinking, which may include:  8 hours before the procedure - stop eating heavy meals, meals with high fiber, or foods such as meat, fried foods, or fatty foods.  6 hours before the procedure - stop eating light meals or foods, such as toast or cereal.  6 hours before the procedure - stop drinking milk or drinks that contain milk.  2 hours before the procedure - stop drinking clear liquids. Medicines  Ask your health care provider about: ? Changing or stopping your regular medicines. This is especially important if you are taking diabetes medicines or blood thinners. ? Taking medicines such as aspirin and ibuprofen. These medicines can thin your blood. Do not take these medicines before your procedure if your health care provider instructs you not to.  You may be given antibiotic medicine to clean out bacteria from your colon. Follow the directions carefully and take the medicine at the correct time. General instructions  You may be prescribed an oral bowel prep to clean out your colon in preparation for the surgery: ? Follow instructions from your health care provider about how to do this. ? Do not eat or drink anything else after you have started the bowel prep, unless your health care provider tells you it is safe to do  so.  Do not use any products that contain nicotine or tobacco, such as cigarettes and e-cigarettes. If you need help quitting, ask your health care provider. What happens during the procedure?  To reduce your risk of infection: ? Your health  care team will wash or sanitize their hands. ? Your skin will be washed with soap.  An IV tube will be inserted into one of your veins to deliver fluid and medication.  You will be given one of the following: ? A medicine to help you relax (sedative). ? A medicine to make you fall asleep (general anesthetic).  Small monitors will be connected to your body. They will be used to check your heart, blood pressure, and oxygen level.  A breathing tube may be placed into your lungs during the procedure.  A thin, flexible tube (catheter) will be placed into your bladder to drain urine.  A tube may be placed through your nose and into your stomach to drain stomach fluids (nasogastric tube, or NG tube).  Your abdomen will be filled with air so it expands. This gives the surgeon more room to operate and makes your organs easier to see.  Several small cuts (incisions) will be made in your abdomen.  A thin, lighted tube with a tiny camera on the end (laparoscope) will be put through one of the small incisions. The camera on the laparoscope will send a picture to a computer screen in the operating room. This will give the surgeon a good view inside your abdomen.  Hollow tubes will be put through the other small incisions in your abdomen. The tools that are needed for the procedure will be put through these tubes.  Clamps or staples will be put on both ends of the diseased part of the colon.  The part of the intestine between the clamps or staples will be removed.  If possible, the ends of the healthy colon that remain will be stitched (sutured) or stapled together to allow your body to pass waste (stool).  Sometimes, the remaining colon cannot be stitched back together. If this is the case, a colostomy will be needed. If you need a colostomy: ? An opening to the outside of your body (stoma) will be made through your abdomen. ? The end of your colon will be brought to the opening. It will be  stitched to the skin. ? A bag will be attached to the opening. Stool will drain into this removable bag. ? The colostomy may be temporary or permanent.  The incisions from the colectomy will be closed with sutures or staples. The procedure may vary among health care providers and hospitals. What happens after the procedure?  Your blood pressure, heart rate, breathing rate, and blood oxygen level will be monitored until the medicines you were given have worn off.  You will receive fluids through an IV tube until your bowels start to work properly.  Once your bowels are working again, you will be given clear liquids first and then solid food as tolerated.  You will be given medicines to control your pain and nausea, if needed.  Do not drive for 24 hours if you were given a sedative. This information is not intended to replace advice given to you by your health care provider. Make sure you discuss any questions you have with your health care provider. Document Revised: 09/17/2017 Document Reviewed: 07/06/2016 Elsevier Patient Education  2020 [employer].

## 2020-07-17 ENCOUNTER — Telehealth: Payer: Self-pay | Admitting: General Surgery

## 2020-07-17 NOTE — Telephone Encounter (Signed)
Patient has been advised of Pre-Admission date/time, COVID Testing date and Surgery date.  Surgery Date: 08/14/20 Preadmission Testing Date: 08/05/20 (phone 1p-5p) Covid Testing Date: 08/12/20 - patient advised to go to [facility] ([address] [facility]) between 8a-1p  Patient has been made aware to call [phone], between 1-3:00pm the day before surgery, to find out what time to arrive for surgery.    Also went over bowel prep and patient voices understanding.

## 2020-07-19 ENCOUNTER — Ambulatory Visit: Payer: No Typology Code available for payment source

## 2020-08-02 ENCOUNTER — Other Ambulatory Visit: Payer: Self-pay | Admitting: General Surgery

## 2020-08-02 DIAGNOSIS — K5720 Diverticulitis of large intestine with perforation and abscess without bleeding: Secondary | ICD-10-CM

## 2020-08-05 ENCOUNTER — Encounter
Admission: RE | Admit: 2020-08-05 | Discharge: 2020-08-05 | Disposition: A | Discharge status: Home / Self Care | Payer: No Typology Code available for payment source | Source: Ambulatory Visit | Attending: General Surgery | Admitting: General Surgery

## 2020-08-05 ENCOUNTER — Other Ambulatory Visit: Payer: Self-pay

## 2020-08-05 DIAGNOSIS — Z01812 Encounter for preprocedural laboratory examination: Secondary | ICD-10-CM | POA: Diagnosis not present

## 2020-08-05 HISTORY — DX: Sleep apnea, unspecified: G47.30

## 2020-08-05 HISTORY — DX: Unspecified asthma, uncomplicated: J45.909

## 2020-08-05 HISTORY — DX: Other complications of anesthesia, initial encounter: T88.59XA

## 2020-08-05 NOTE — Patient Instructions (Signed)
Your procedure is scheduled on:08-14-20 [facility] Report to Day Surgery on the 2nd floor of the [facility]. To find out your arrival time, please call [phone] between 1PM - 3PM on: 08-13-20 TUESDAY  REMEMBER: Instructions that are not followed completely may result in serious medical risk, up to and including death; or upon the discretion of your surgeon and anesthesiologist your surgery may need to be rescheduled.  Do not eat food after midnight the night before surgery.  No gum chewing, lozengers or hard candies.  You may however, drink CLEAR liquids up to 2 hours before you are scheduled to arrive for your surgery. Do not drink anything within 2 hours of your scheduled arrival time.  Clear liquids include: - water  - apple juice without pulp - gatorade (not RED, PURPLE, OR BLUE) - black coffee or tea (Do NOT add milk or creamers to the coffee or tea) Do NOT drink anything that is not on this list  TAKE THESE MEDICATIONS THE MORNING OF SURGERY WITH A SIP OF WATER: -ZOLOFT (SERTRALINE) -PROTONIX (PANTOPRAZOLE)-take one the night before and one on the morning of surgery - helps to prevent nausea after surgery.) -FOLLOW DR CANNON'S BOWEL PREP INSTRUCTIONS   Use inhalers on the day of surgery and bring to the hospital-USE YOUR ALBUTEROL INHALER THE MORNING OF SURGERY AND BRING INHALER TO HOSPITAL  One week prior to surgery: Stop Anti-inflammatories (NSAIDS) such as Advil, Aleve, Ibuprofen, Motrin, Naproxen, Naprosyn and Aspirin based products such as Excedrin, Goodys Powder, BC Powder-OK TO TAKE TYLENOL IF NEEDED  Stop ANY OVER THE COUNTER supplements until after surgery.  No Alcohol for 24 hours before or after surgery.  No Smoking including e-cigarettes for 24 hours prior to surgery.  No chewable tobacco products for at least 6 hours prior to surgery.  No nicotine patches on the day of surgery.  Do not use any "recreational" drugs for at least a week prior to your  surgery.  Please be advised that the combination of cocaine and anesthesia may have negative outcomes, up to and including death. If you test positive for cocaine, your surgery will be cancelled.  On the morning of surgery brush your teeth with toothpaste and water, you may rinse your mouth with mouthwash if you wish. Do not swallow any toothpaste or mouthwash.  Do not wear jewelry, make-up, hairpins, clips or nail polish.  Do not wear lotions, powders, or perfumes.   Do not shave 48 hours prior to surgery.   Contact lenses, hearing aids and dentures may not be worn into surgery.  Do not bring valuables to the hospital. [facility] is not responsible for any missing/lost belongings or valuables.   Use CHG Soap as directed on instruction sheet.  Bring your C-PAP to the hospital with you in case you may have to spend the night.   Notify your doctor if there is any change in your medical condition (cold, fever, infection).  Wear comfortable clothing (specific to your surgery type) to the hospital.  Plan for stool softeners for home use; pain medications have a tendency to cause constipation. You can also help prevent constipation by eating foods high in fiber such as fruits and vegetables and drinking plenty of fluids as your diet allows.  After surgery, you can help prevent lung complications by doing breathing exercises.  Take deep breaths and cough every 1-2 hours. Your doctor may order a device called an Incentive Spirometer to help you take deep breaths. When coughing or sneezing,  hold a pillow firmly against your incision with both hands. This is called "splinting." Doing this helps protect your incision. It also decreases belly discomfort.  If you are being admitted to the hospital overnight, leave your suitcase in the car. After surgery it may be brought to your room.  If you are being discharged the day of surgery, you will not be allowed to drive home. You will need a  responsible adult (18 years or older) to drive you home and stay with you that night.   If you are taking public transportation, you will need to have a responsible adult (18 years or older) with you. Please confirm with your physician that it is acceptable to use public transportation.   Please call the Pre-admissions Testing Dept. at [phone] if you have any questions about these instructions.  Visitation Policy:  Patients undergoing a surgery or procedure may have one family member or support person with them as long as that person is not COVID-19 positive or experiencing its symptoms.  That person may remain in the waiting area during the procedure.  Inpatient Visitation Update:   In an effort to ensure the safety of our team members and our patients, we are implementing a change to our visitation policy:  Effective Monday, Aug. 9, at 7 a.m., inpatients will be allowed one support person.  o The support person may change daily.  o The support person must pass our screening, gel in and out, and wear a mask at all times, including in the patient's room.  o Patients must also wear a mask when staff or their support person are in the room.  o Masking is required regardless of vaccination status.  Systemwide, no visitors 17 or younger.

## 2020-08-06 ENCOUNTER — Other Ambulatory Visit: Payer: Self-pay | Admitting: Surgery

## 2020-08-07 ENCOUNTER — Telehealth: Payer: Self-pay | Admitting: General Surgery

## 2020-08-07 NOTE — Telephone Encounter (Signed)
Patient states that his pharmacy only received the medication for his Cipro but not the Metronidazole.  Please call patient.  Thank you.

## 2020-08-08 ENCOUNTER — Other Ambulatory Visit: Payer: Self-pay

## 2020-08-08 MED ORDER — METRONIDAZOLE 500 MG PO TABS: 500.0000 mg | ORAL_TABLET | Freq: Three times a day (TID) | ORAL | 0 refills | Status: DC

## 2020-08-08 NOTE — Telephone Encounter (Signed)
Spoke with the patient and per Dr Lady Gary he will be taking the Flagyl and Cipro up to surgery day. He will also be taking the Neomycin the day before surgery along with the prep as ordered.  I have sent in a prescription into his pharmacy for the Flagyl. He will call with any further questions.

## 2020-08-12 ENCOUNTER — Encounter
Admission: RE | Admit: 2020-08-12 | Discharge: 2020-08-12 | Disposition: A | Discharge status: Home / Self Care | Payer: No Typology Code available for payment source | Source: Ambulatory Visit | Attending: General Surgery | Admitting: General Surgery

## 2020-08-12 ENCOUNTER — Other Ambulatory Visit: Payer: Self-pay

## 2020-08-12 ENCOUNTER — Other Ambulatory Visit: Payer: Self-pay | Admitting: Urology

## 2020-08-12 DIAGNOSIS — Z20822 Contact with and (suspected) exposure to covid-19: Secondary | ICD-10-CM | POA: Insufficient documentation

## 2020-08-12 DIAGNOSIS — Z01812 Encounter for preprocedural laboratory examination: Secondary | ICD-10-CM | POA: Insufficient documentation

## 2020-08-12 LAB — SARS CORONAVIRUS 2 (TAT 6-24 HRS): SARS Coronavirus 2: NEGATIVE

## 2020-08-12 NOTE — Consult Note (Signed)
WOC Nurse requested for preoperative stoma site marking  Discussed surgical procedure and stoma creation with patient and family.  Explained role of the WOC nurse team.  Provided the patient with educational booklet and provided samples of pouching options.  Answered patient and family questions.   Examined patient  sitting, and standing in order to place the marking in the patient's visual field, away from any creases or abdominal contour issues and within the rectus muscle.   Marked for colostomy in the LLQ  _4___ cm to the left of the umbilicus and _3___cm below the umbilicus.   Patient's abdomen cleansed with CHG wipes at site markings, allowed to air dry prior to marking.Covered mark with thin film transparent dressing to preserve mark until date of surgery.   WOC Nurse team will follow up with patient after surgery for continue ostomy care and teaching.  Annisten Manchester [facility] MSN, RN,CWOCN, CNS, [employer] [phone]

## 2020-08-13 LAB — TYPE AND SCREEN
ABO/RH(D): B POS
Antibody Screen: NEGATIVE

## 2020-08-14 ENCOUNTER — Encounter
Admission: RE | Disposition: A | Discharge status: Home / Self Care | Payer: Self-pay | Source: Home / Self Care | Attending: General Surgery

## 2020-08-14 ENCOUNTER — Encounter: Payer: Self-pay | Admitting: General Surgery

## 2020-08-14 ENCOUNTER — Inpatient Hospital Stay: Payer: No Typology Code available for payment source | Admitting: Urgent Care

## 2020-08-14 ENCOUNTER — Inpatient Hospital Stay
Admission: RE | Admit: 2020-08-14 | Discharge: 2020-08-17 | DRG: 331 | Disposition: A | Discharge status: Home / Self Care | Payer: No Typology Code available for payment source | Attending: General Surgery | Admitting: General Surgery

## 2020-08-14 ENCOUNTER — Other Ambulatory Visit: Payer: Self-pay

## 2020-08-14 ENCOUNTER — Inpatient Hospital Stay: Payer: No Typology Code available for payment source | Admitting: Anesthesiology

## 2020-08-14 DIAGNOSIS — Z8249 Family history of ischemic heart disease and other diseases of the circulatory system: Secondary | ICD-10-CM | POA: Diagnosis not present

## 2020-08-14 DIAGNOSIS — Z9049 Acquired absence of other specified parts of digestive tract: Secondary | ICD-10-CM

## 2020-08-14 DIAGNOSIS — Z7951 Long term (current) use of inhaled steroids: Secondary | ICD-10-CM | POA: Diagnosis not present

## 2020-08-14 DIAGNOSIS — Z87891 Personal history of nicotine dependence: Secondary | ICD-10-CM | POA: Diagnosis not present

## 2020-08-14 DIAGNOSIS — A408 Other streptococcal sepsis: Secondary | ICD-10-CM | POA: Diagnosis not present

## 2020-08-14 DIAGNOSIS — K219 Gastro-esophageal reflux disease without esophagitis: Secondary | ICD-10-CM | POA: Diagnosis present

## 2020-08-14 DIAGNOSIS — Z833 Family history of diabetes mellitus: Secondary | ICD-10-CM

## 2020-08-14 DIAGNOSIS — Z91013 Allergy to seafood: Secondary | ICD-10-CM | POA: Diagnosis not present

## 2020-08-14 DIAGNOSIS — Z79899 Other long term (current) drug therapy: Secondary | ICD-10-CM

## 2020-08-14 DIAGNOSIS — Z880 Allergy status to penicillin: Secondary | ICD-10-CM | POA: Diagnosis not present

## 2020-08-14 DIAGNOSIS — Z801 Family history of malignant neoplasm of trachea, bronchus and lung: Secondary | ICD-10-CM

## 2020-08-14 DIAGNOSIS — K5720 Diverticulitis of large intestine with perforation and abscess without bleeding: Principal | ICD-10-CM | POA: Diagnosis present

## 2020-08-14 DIAGNOSIS — Z20822 Contact with and (suspected) exposure to covid-19: Secondary | ICD-10-CM | POA: Diagnosis present

## 2020-08-14 DIAGNOSIS — N3289 Other specified disorders of bladder: Secondary | ICD-10-CM

## 2020-08-14 HISTORY — PX: CYSTOSCOPY WITH STENT PLACEMENT: SHX5790

## 2020-08-14 HISTORY — PX: CYSTOSCOPY WITH BIOPSY: SHX5122

## 2020-08-14 HISTORY — PX: LAPAROTOMY: SHX154

## 2020-08-14 HISTORY — PX: COLON RESECTION SIGMOID: SHX6737

## 2020-08-14 LAB — ABO/RH: ABO/RH(D): B POS

## 2020-08-14 LAB — CREATININE, SERUM
Creatinine, Ser: 1.09 mg/dL (ref 0.61–1.24)
GFR, Estimated: >60 mL/min (ref >60)

## 2020-08-14 LAB — CBC
HCT: 42.1 % (ref 39.0–52.0)
Hemoglobin: 14.2 g/dL (ref 13.0–17.0)
MCH: 27.6 pg (ref 26.0–34.0)
MCHC: 33.7 g/dL (ref 30.0–36.0)
MCV: 81.9 fL (ref 80.0–100.0)
Platelets: 289 10*3/uL (ref 150–400)
RBC: 5.14 MIL/uL (ref 4.22–5.81)
RDW: 13.4 % (ref 11.5–15.5)
WBC: 12 10*3/uL — ABNORMAL HIGH (ref 4.0–10.5)
nRBC: 0 % (ref 0.0–0.2)

## 2020-08-14 SURGERY — LAPAROTOMY, EXPLORATORY
Anesthesia: General

## 2020-08-14 MED ORDER — ORAL CARE MOUTH RINSE: 15.0000 mL | Freq: Once | OROMUCOSAL | Status: AC

## 2020-08-14 MED ORDER — GABAPENTIN 300 MG PO CAPS: 300.0000 mg | ORAL_CAPSULE | ORAL | Status: AC

## 2020-08-14 MED ORDER — HYDROMORPHONE HCL 1 MG/ML IJ SOLN
INTRAMUSCULAR | Status: AC
  Filled 2020-08-14: qty 0.5

## 2020-08-14 MED ORDER — DEXTROSE IN LACTATED RINGERS 5 % IV SOLN: INTRAVENOUS | Status: DC

## 2020-08-14 MED ORDER — METRONIDAZOLE IN NACL 5-0.79 MG/ML-% IV SOLN
500.0000 mg | INTRAVENOUS | Status: AC
  Administered 2020-08-14: 500 mg via INTRAVENOUS
  Filled 2020-08-14: qty 100

## 2020-08-14 MED ORDER — ACETAMINOPHEN 500 MG PO TABS
ORAL_TABLET | ORAL | Status: AC
  Administered 2020-08-14: 1000 mg via ORAL
  Filled 2020-08-14: qty 2

## 2020-08-14 MED ORDER — FENTANYL CITRATE (PF) 250 MCG/5ML IJ SOLN
INTRAMUSCULAR | Status: AC
  Filled 2020-08-14: qty 5

## 2020-08-14 MED ORDER — MIDAZOLAM HCL 2 MG/2ML IJ SOLN
INTRAMUSCULAR | Status: AC
  Filled 2020-08-14: qty 2

## 2020-08-14 MED ORDER — ACETAMINOPHEN 500 MG PO TABS
1000.0000 mg | ORAL_TABLET | Freq: Four times a day (QID) | ORAL | Status: DC
  Administered 2020-08-15 – 2020-08-17 (×7): 1000 mg via ORAL
  Filled 2020-08-14 (×6): qty 2

## 2020-08-14 MED ORDER — HYDROMORPHONE HCL 1 MG/ML IJ SOLN
INTRAMUSCULAR | Status: AC
  Administered 2020-08-14: 0.5 mg via INTRAVENOUS
  Filled 2020-08-14: qty 0.5

## 2020-08-14 MED ORDER — BUPIVACAINE LIPOSOME 1.3 % IJ SUSP: 20.0000 mL | Freq: Once | INTRAMUSCULAR | Status: DC

## 2020-08-14 MED ORDER — ROCURONIUM BROMIDE 100 MG/10ML IV SOLN
INTRAVENOUS | Status: DC | PRN
  Administered 2020-08-14: 20 mg via INTRAVENOUS
  Administered 2020-08-14: 30 mg via INTRAVENOUS
  Administered 2020-08-14: 20 mg via INTRAVENOUS
  Administered 2020-08-14: 50 mg via INTRAVENOUS
  Administered 2020-08-14: 20 mg via INTRAVENOUS

## 2020-08-14 MED ORDER — ALVIMOPAN 12 MG PO CAPS
ORAL_CAPSULE | ORAL | Status: AC
  Administered 2020-08-14: 12 mg via ORAL
  Filled 2020-08-14: qty 1

## 2020-08-14 MED ORDER — BUPIVACAINE HCL 0.25 % IJ SOLN
INTRAMUSCULAR | Status: DC | PRN
  Administered 2020-08-14: 30 mL

## 2020-08-14 MED ORDER — BUPIVACAINE HCL (PF) 0.25 % IJ SOLN
INTRAMUSCULAR | Status: AC
  Filled 2020-08-14: qty 30

## 2020-08-14 MED ORDER — KETOROLAC TROMETHAMINE 30 MG/ML IJ SOLN
INTRAMUSCULAR | Status: DC | PRN
  Administered 2020-08-14: 30 mg via INTRAVENOUS

## 2020-08-14 MED ORDER — CHLORHEXIDINE GLUCONATE CLOTH 2 % EX PADS: 6.0000 | MEDICATED_PAD | Freq: Once | CUTANEOUS | Status: DC

## 2020-08-14 MED ORDER — DEXAMETHASONE SODIUM PHOSPHATE 10 MG/ML IJ SOLN
INTRAMUSCULAR | Status: DC | PRN
  Administered 2020-08-14: 5 mg via INTRAVENOUS

## 2020-08-14 MED ORDER — FENTANYL CITRATE (PF) 100 MCG/2ML IJ SOLN
INTRAMUSCULAR | Status: AC
Start: 2020-08-14 — End: 2020-08-15
  Filled 2020-08-14: qty 2

## 2020-08-14 MED ORDER — METHYLENE BLUE 0.5 % INJ SOLN
INTRAVENOUS | Status: DC | PRN
  Administered 2020-08-14: 10 mL

## 2020-08-14 MED ORDER — CELECOXIB 200 MG PO CAPS
200.0000 mg | ORAL_CAPSULE | Freq: Two times a day (BID) | ORAL | Status: DC
  Administered 2020-08-14 – 2020-08-17 (×5): 200 mg via ORAL
  Filled 2020-08-14 (×5): qty 1

## 2020-08-14 MED ORDER — SUGAMMADEX SODIUM 200 MG/2ML IV SOLN
INTRAVENOUS | Status: DC | PRN
  Administered 2020-08-14: 200 mg via INTRAVENOUS

## 2020-08-14 MED ORDER — SIMETHICONE 80 MG PO CHEW
40.0000 mg | CHEWABLE_TABLET | Freq: Four times a day (QID) | ORAL | Status: DC | PRN
  Filled 2020-08-14: qty 1

## 2020-08-14 MED ORDER — PROPOFOL 10 MG/ML IV BOLUS
INTRAVENOUS | Status: AC
  Filled 2020-08-14: qty 40

## 2020-08-14 MED ORDER — LACTATED RINGERS IV SOLN: INTRAVENOUS | Status: DC

## 2020-08-14 MED ORDER — ENOXAPARIN SODIUM 40 MG/0.4ML ~~LOC~~ SOLN
SUBCUTANEOUS | Status: AC
  Administered 2020-08-14: 40 mg via SUBCUTANEOUS
  Filled 2020-08-14: qty 0.4

## 2020-08-14 MED ORDER — ALBUTEROL SULFATE HFA 108 (90 BASE) MCG/ACT IN AERS
2.0000 | INHALATION_SPRAY | Freq: Four times a day (QID) | RESPIRATORY_TRACT | Status: DC | PRN
  Filled 2020-08-14: qty 6.7

## 2020-08-14 MED ORDER — KETOROLAC TROMETHAMINE 30 MG/ML IJ SOLN
INTRAMUSCULAR | Status: AC
  Filled 2020-08-14: qty 1

## 2020-08-14 MED ORDER — HEPARIN SODIUM (PORCINE) 5000 UNIT/ML IJ SOLN
INTRAMUSCULAR | Status: AC
  Filled 2020-08-14: qty 1

## 2020-08-14 MED ORDER — ONDANSETRON HCL 4 MG/2ML IJ SOLN: 4.0000 mg | Freq: Four times a day (QID) | INTRAMUSCULAR | Status: DC | PRN

## 2020-08-14 MED ORDER — ENOXAPARIN SODIUM 40 MG/0.4ML ~~LOC~~ SOLN: 40.0000 mg | Freq: Once | SUBCUTANEOUS | Status: AC

## 2020-08-14 MED ORDER — SERTRALINE HCL 50 MG PO TABS
50.0000 mg | ORAL_TABLET | ORAL | Status: DC
  Administered 2020-08-15 – 2020-08-17 (×3): 50 mg via ORAL
  Filled 2020-08-14 (×5): qty 1

## 2020-08-14 MED ORDER — GABAPENTIN 300 MG PO CAPS
300.0000 mg | ORAL_CAPSULE | Freq: Two times a day (BID) | ORAL | Status: DC
  Administered 2020-08-14 – 2020-08-17 (×5): 300 mg via ORAL
  Filled 2020-08-14 (×4): qty 1

## 2020-08-14 MED ORDER — SODIUM CHLORIDE 0.9 % IV SOLN
INTRAVENOUS | Status: DC | PRN
  Administered 2020-08-14: 70 mL

## 2020-08-14 MED ORDER — HYDROMORPHONE HCL 1 MG/ML IJ SOLN
INTRAMUSCULAR | Status: AC
  Filled 2020-08-14: qty 1

## 2020-08-14 MED ORDER — KETAMINE HCL 50 MG/ML IJ SOLN
INTRAMUSCULAR | Status: AC
  Filled 2020-08-14: qty 10

## 2020-08-14 MED ORDER — PROPOFOL 10 MG/ML IV BOLUS
INTRAVENOUS | Status: DC | PRN
  Administered 2020-08-14: 200 mg via INTRAVENOUS

## 2020-08-14 MED ORDER — METHOCARBAMOL 500 MG PO TABS
500.0000 mg | ORAL_TABLET | Freq: Four times a day (QID) | ORAL | Status: DC | PRN
  Administered 2020-08-15 – 2020-08-17 (×3): 500 mg via ORAL
  Filled 2020-08-14 (×3): qty 1

## 2020-08-14 MED ORDER — CIPROFLOXACIN IN D5W 400 MG/200ML IV SOLN: 400.0000 mg | INTRAVENOUS | Status: AC

## 2020-08-14 MED ORDER — HYDROMORPHONE HCL 1 MG/ML IJ SOLN
0.5000 mg | INTRAMUSCULAR | Status: DC | PRN
  Administered 2020-08-14 – 2020-08-17 (×8): 0.5 mg via INTRAVENOUS
  Filled 2020-08-14 (×6): qty 0.5

## 2020-08-14 MED ORDER — BUPIVACAINE HCL (PF) 0.5 % IJ SOLN
INTRAMUSCULAR | Status: AC
  Filled 2020-08-14: qty 30

## 2020-08-14 MED ORDER — LIDOCAINE HCL (CARDIAC) PF 100 MG/5ML IV SOSY
PREFILLED_SYRINGE | INTRAVENOUS | Status: DC | PRN
  Administered 2020-08-14: 100 mg via INTRAVENOUS

## 2020-08-14 MED ORDER — ONDANSETRON HCL 4 MG/2ML IJ SOLN
INTRAMUSCULAR | Status: DC | PRN
  Administered 2020-08-14: 4 mg via INTRAVENOUS

## 2020-08-14 MED ORDER — ONDANSETRON 4 MG PO TBDP: 4.0000 mg | ORAL_TABLET | Freq: Four times a day (QID) | ORAL | Status: DC | PRN

## 2020-08-14 MED ORDER — LACTATED RINGERS IV SOLN: INTRAVENOUS | Status: DC | PRN

## 2020-08-14 MED ORDER — KETAMINE HCL 10 MG/ML IJ SOLN
INTRAMUSCULAR | Status: DC | PRN
  Administered 2020-08-14: 50 mg via INTRAVENOUS

## 2020-08-14 MED ORDER — FENTANYL CITRATE (PF) 100 MCG/2ML IJ SOLN
25.0000 ug | INTRAMUSCULAR | Status: DC | PRN
  Administered 2020-08-14 (×5): 25 ug via INTRAVENOUS

## 2020-08-14 MED ORDER — BUPIVACAINE LIPOSOME 1.3 % IJ SUSP
INTRAMUSCULAR | Status: AC
  Filled 2020-08-14: qty 20

## 2020-08-14 MED ORDER — MIDAZOLAM HCL 2 MG/2ML IJ SOLN
INTRAMUSCULAR | Status: DC | PRN
  Administered 2020-08-14: 2 mg via INTRAVENOUS

## 2020-08-14 MED ORDER — ALVIMOPAN 12 MG PO CAPS: 12.0000 mg | ORAL_CAPSULE | ORAL | Status: AC

## 2020-08-14 MED ORDER — CHLORHEXIDINE GLUCONATE 0.12 % MT SOLN
OROMUCOSAL | Status: AC
  Administered 2020-08-14: 15 mL via OROMUCOSAL
  Filled 2020-08-14: qty 15

## 2020-08-14 MED ORDER — SODIUM CHLORIDE (PF) 0.9 % IJ SOLN
INTRAMUSCULAR | Status: AC
  Filled 2020-08-14: qty 50

## 2020-08-14 MED ORDER — FENTANYL CITRATE (PF) 100 MCG/2ML IJ SOLN
INTRAMUSCULAR | Status: DC | PRN
  Administered 2020-08-14: 50 ug via INTRAVENOUS
  Administered 2020-08-14: 100 ug via INTRAVENOUS
  Administered 2020-08-14 (×2): 50 ug via INTRAVENOUS

## 2020-08-14 MED ORDER — KETOROLAC TROMETHAMINE 30 MG/ML IJ SOLN
30.0000 mg | Freq: Four times a day (QID) | INTRAMUSCULAR | Status: DC | PRN
  Administered 2020-08-14 – 2020-08-16 (×4): 30 mg via INTRAVENOUS
  Filled 2020-08-14: qty 1

## 2020-08-14 MED ORDER — CELECOXIB 200 MG PO CAPS: 200.0000 mg | ORAL_CAPSULE | ORAL | Status: AC

## 2020-08-14 MED ORDER — CELECOXIB 200 MG PO CAPS
ORAL_CAPSULE | ORAL | Status: AC
  Administered 2020-08-14: 200 mg via ORAL
  Filled 2020-08-14: qty 1

## 2020-08-14 MED ORDER — GABAPENTIN 300 MG PO CAPS
ORAL_CAPSULE | ORAL | Status: AC
  Administered 2020-08-14: 300 mg via ORAL
  Filled 2020-08-14: qty 1

## 2020-08-14 MED ORDER — ALVIMOPAN 12 MG PO CAPS
12.0000 mg | ORAL_CAPSULE | Freq: Two times a day (BID) | ORAL | Status: DC
  Administered 2020-08-15: 12 mg via ORAL
  Filled 2020-08-14 (×2): qty 1

## 2020-08-14 MED ORDER — GABAPENTIN 300 MG PO CAPS
ORAL_CAPSULE | ORAL | Status: AC
  Filled 2020-08-14: qty 1

## 2020-08-14 MED ORDER — ACETAMINOPHEN 500 MG PO TABS: 1000.0000 mg | ORAL_TABLET | ORAL | Status: AC

## 2020-08-14 MED ORDER — HYDROMORPHONE HCL 1 MG/ML IJ SOLN
INTRAMUSCULAR | Status: DC | PRN
  Administered 2020-08-14: 1 mg via INTRAVENOUS

## 2020-08-14 MED ORDER — EPHEDRINE SULFATE 50 MG/ML IJ SOLN
INTRAMUSCULAR | Status: DC | PRN
  Administered 2020-08-14: 5 mg via INTRAVENOUS

## 2020-08-14 MED ORDER — CIPROFLOXACIN IN D5W 400 MG/200ML IV SOLN
INTRAVENOUS | Status: AC
  Administered 2020-08-14: 400 mg via INTRAVENOUS
  Filled 2020-08-14: qty 200

## 2020-08-14 MED ORDER — CHLORHEXIDINE GLUCONATE 0.12 % MT SOLN: 15.0000 mL | Freq: Once | OROMUCOSAL | Status: AC

## 2020-08-14 MED ORDER — ENOXAPARIN SODIUM 40 MG/0.4ML ~~LOC~~ SOLN: 40.0000 mg | SUBCUTANEOUS | Status: DC

## 2020-08-14 MED ORDER — PANTOPRAZOLE SODIUM 20 MG PO TBEC
20.0000 mg | DELAYED_RELEASE_TABLET | ORAL | Status: DC
  Administered 2020-08-15 – 2020-08-16 (×2): 20 mg via ORAL
  Filled 2020-08-14 (×4): qty 1

## 2020-08-14 MED ORDER — ONDANSETRON HCL 4 MG/2ML IJ SOLN: 4.0000 mg | Freq: Once | INTRAMUSCULAR | Status: DC | PRN

## 2020-08-14 MED ORDER — CELECOXIB 200 MG PO CAPS
ORAL_CAPSULE | ORAL | Status: AC
  Filled 2020-08-14: qty 1

## 2020-08-14 SURGICAL SUPPLY — 83 items
ADAPTER CATH URET CONN 4-6FR (ADAPTER) ×4 IMPLANT
BAG DRAIN CYSTO-URO LG1000N (MISCELLANEOUS) ×4 IMPLANT
BAG URINE DRAIN 2000ML AR STRL (UROLOGICAL SUPPLIES) ×4 IMPLANT
BASIN GRAD PLASTIC 32OZ STRL (MISCELLANEOUS) ×4 IMPLANT
BRUSH SCRUB EZ 1% IODOPHOR (MISCELLANEOUS) IMPLANT
BULB RESERV EVAC DRAIN JP 100C (MISCELLANEOUS) ×4 IMPLANT
CANISTER SUCT 1200ML W/VALVE (MISCELLANEOUS) IMPLANT
CATH FOLEY 2WAY SIL 20X30 (CATHETERS) ×4 IMPLANT
CATH FOLEY 5CC 30FR (CATHETERS) ×4 IMPLANT
CATH ROBINSON RED A/P 20FR (CATHETERS) ×4 IMPLANT
CATH URETL 5X70 OPEN END (CATHETERS) ×12 IMPLANT
CHLORAPREP W/TINT 26 (MISCELLANEOUS) ×4 IMPLANT
CONRAY 43 FOR UROLOGY 50M (MISCELLANEOUS) IMPLANT
COVER BACK TABLE REUSABLE LG (DRAPES) IMPLANT
COVER WAND RF STERILE (DRAPES) ×4 IMPLANT
DRAIN CHANNEL JP 19F (MISCELLANEOUS) ×4 IMPLANT
DRAPE LAPAROTOMY 100X77 ABD (DRAPES) ×4 IMPLANT
DRAPE LEGGINS SURG 28X43 STRL (DRAPES) ×4 IMPLANT
DRAPE UNDER BUTTOCK W/FLU (DRAPES) ×4 IMPLANT
DRSG TELFA 4X8 ISLAND PHMB (GAUZE/BANDAGES/DRESSINGS) ×8 IMPLANT
ELECT CAUTERY BLADE TIP 2.5 (TIP)
ELECT EZSTD 165MM 6.5IN (MISCELLANEOUS)
ELECT REM PT RETURN 9FT ADLT (ELECTROSURGICAL) ×4
ELECTRODE CAUTERY BLDE TIP 2.5 (TIP) IMPLANT
ELECTRODE EZSTD 165MM 6.5IN (MISCELLANEOUS) IMPLANT
ELECTRODE REM PT RTRN 9FT ADLT (ELECTROSURGICAL) ×3 IMPLANT
GLOVE BIO SURGEON STRL SZ 6.5 (GLOVE) ×12 IMPLANT
GLOVE BIOGEL PI IND STRL 7.5 (GLOVE) ×3 IMPLANT
GLOVE BIOGEL PI INDICATOR 7.5 (GLOVE) ×1
GLOVE INDICATOR 7.0 STRL GRN (GLOVE) ×12 IMPLANT
GOWN STRL REUS W/ TWL LRG LVL3 (GOWN DISPOSABLE) ×27 IMPLANT
GOWN STRL REUS W/ TWL XL LVL3 (GOWN DISPOSABLE) ×3 IMPLANT
GOWN STRL REUS W/TWL LRG LVL3 (GOWN DISPOSABLE) ×9
GOWN STRL REUS W/TWL XL LVL3 (GOWN DISPOSABLE) ×1
GUIDEWIRE STR DUAL SENSOR (WIRE) ×4 IMPLANT
IV NS 1000ML (IV SOLUTION) ×1
IV NS 1000ML BAXH (IV SOLUTION) ×3 IMPLANT
JELLY LUB 2OZ STRL (MISCELLANEOUS) ×1
JELLY LUBE 2OZ STRL (MISCELLANEOUS) ×3 IMPLANT
KIT TURNOVER CYSTO (KITS) ×8 IMPLANT
LABEL OR SOLS (LABEL) ×4 IMPLANT
LIGASURE IMPACT 36 18CM CVD LR (INSTRUMENTS) IMPLANT
LIGASURE LAP MARYLAND 5MM 37CM (ELECTROSURGICAL) ×4 IMPLANT
NEEDLE HYPO 22GX1.5 SAFETY (NEEDLE) ×4 IMPLANT
NS IRRIG 1000ML POUR BTL (IV SOLUTION) ×8 IMPLANT
PACK BASIN MAJOR ARMC (MISCELLANEOUS) ×4 IMPLANT
PACK COLON CLEAN CLOSURE (MISCELLANEOUS) ×4 IMPLANT
PACK CYSTO AR (MISCELLANEOUS) ×4 IMPLANT
RELOAD LINEAR CUT PROX 55 BLUE (ENDOMECHANICALS) ×4 IMPLANT
RELOAD PROXIMATE 75MM BLUE (ENDOMECHANICALS) IMPLANT
SET CYSTO W/LG BORE CLAMP LF (SET/KITS/TRAYS/PACK) ×4 IMPLANT
SOL .9 NS 3000ML IRR  AL (IV SOLUTION) ×1
SOL .9 NS 3000ML IRR UROMATIC (IV SOLUTION) ×3 IMPLANT
SOL PREP PVP 2OZ (MISCELLANEOUS) ×4
SOLUTION PREP PVP 2OZ (MISCELLANEOUS) ×3 IMPLANT
SPONGE LAP 18X18 RF (DISPOSABLE) ×12 IMPLANT
STAPLER CIRCULAR MANUAL XL 25 (STAPLE) ×4 IMPLANT
STAPLER CUT CVD 40MM GREEN (STAPLE) ×4 IMPLANT
STAPLER GUN LINEAR PROX 60 (STAPLE) IMPLANT
STAPLER PROXIMATE 55 BLUE (STAPLE) ×8 IMPLANT
STAPLER PROXIMATE 75MM BLUE (STAPLE) IMPLANT
STAPLER SKIN PROX 35W (STAPLE) ×4 IMPLANT
STENT URET 6FRX24 CONTOUR (STENTS) IMPLANT
STENT URET 6FRX26 CONTOUR (STENTS) IMPLANT
SURGILUBE 2OZ TUBE FLIPTOP (MISCELLANEOUS) ×4 IMPLANT
SUT ETHILON 3-0 FS-10 30 BLK (SUTURE) ×4
SUT NYLON 2-0 (SUTURE) IMPLANT
SUT PDS AB 1 TP1 54 (SUTURE) ×8 IMPLANT
SUT PROLENE 0 CT 1 30 (SUTURE) IMPLANT
SUT SILK 2 0 (SUTURE) ×2
SUT SILK 2-0 30XBRD TIE 12 (SUTURE) ×6 IMPLANT
SUT SILK 3-0 (SUTURE) ×8 IMPLANT
SUT VIC AB 2-0 SH 27 (SUTURE)
SUT VIC AB 2-0 SH 27XBRD (SUTURE) IMPLANT
SUT VIC AB 3-0 SH 27 (SUTURE) ×1
SUT VIC AB 3-0 SH 27X BRD (SUTURE) ×3 IMPLANT
SUTURE EHLN 3-0 FS-10 30 BLK (SUTURE) ×3 IMPLANT
SYR 20ML LL LF (SYRINGE) ×8 IMPLANT
SYR TOOMEY 50ML (SYRINGE) ×4 IMPLANT
SYR TOOMEY IRRIG 70ML (MISCELLANEOUS) ×4
SYRINGE TOOMEY IRRIG 70ML (MISCELLANEOUS) ×3 IMPLANT
TRAY FOLEY MTR SLVR 16FR STAT (SET/KITS/TRAYS/PACK) IMPLANT
WATER STERILE IRR 1000ML POUR (IV SOLUTION) ×4 IMPLANT

## 2020-08-14 NOTE — Anesthesia Preprocedure Evaluation (Signed)
Anesthesia Evaluation  Patient identified by MRN, date of birth, ID band Patient awake    Reviewed: Allergy & Precautions, NPO status , Patient's Chart, lab work & pertinent test results  History of Anesthesia Complications (+) PROLONGED EMERGENCE and history of anesthetic complications  Airway Mallampati: II  TM Distance: >3 FB     Dental  (+) Chipped   Pulmonary asthma , sleep apnea , former smoker,    Pulmonary exam normal        Cardiovascular negative cardio ROS Normal cardiovascular exam     Neuro/Psych  Headaches, PSYCHIATRIC DISORDERS Anxiety  Neuromuscular disease    GI/Hepatic Neg liver ROS, GERD  ,  Endo/Other  negative endocrine ROS  Renal/GU negative Renal ROS  negative genitourinary   Musculoskeletal negative musculoskeletal ROS (+)   Abdominal Normal abdominal exam  (+)   Peds negative pediatric ROS (+)  Hematology negative hematology ROS (+)   Anesthesia Other Findings Past Medical History: No date: Asthma     Comment:  WELL CONTROLLED No date: Complication of anesthesia     Comment:  HARD TO WAKE UP AFTER COLONOSCOPY No date: Diverticulitis No date: GERD (gastroesophageal reflux disease) No date: History of chicken pox No date: History of IBS No date: History of measles No date: Panic disorder No date: Sleep apnea     Comment:  USES CPAP  Reproductive/Obstetrics                             Anesthesia Physical Anesthesia Plan  ASA: II  Anesthesia Plan: General   Post-op Pain Management:    Induction: Intravenous  PONV Risk Score and Plan:   Airway Management Planned: Oral ETT  Additional Equipment:   Intra-op Plan:   Post-operative Plan: Extubation in OR  Informed Consent: I have reviewed the patients History and Physical, chart, labs and discussed the procedure including the risks, benefits and alternatives for the proposed anesthesia with the  patient or authorized representative who has indicated his/her understanding and acceptance.     Dental advisory given  Plan Discussed with: CRNA and Surgeon  Anesthesia Plan Comments:         Anesthesia Quick Evaluation

## 2020-08-14 NOTE — H&P (Signed)
Chief Complaint  Patient presents with  . Follow-up    discuss surgery    HPI Derek Ramirez is a 44 y.o. male.   I first saw him in June of this year, when he presented to the emergency department with abdominal pain.  My initial history of present illness is copied here:  "44 y.o. male presented to [facility] ED today for abdominal pain. Patient reports that around 830 PM last night he had the abrupt onset of suprapubic and LLQ abdominal pain. This was severe and sharp in nature. The pain has been constant since the onset, does not radiate, and is exacerbated with movement and coughing. He endorses associated nausea, diarrhea, and dysuria with the pain. No fever, chills, cough, CP, SOB, or emesis. He has a history of diverticulitis and believes this is his 4th episode in the last year. Most recently he was seen in the ED on 02/2020 for this and sent home on Bactrim. This feels similar to his previous episodes of diverticulitis but much more severe. He has never seen a Development worker, international aid for this. No previous abdominal surgeries. Last colonoscopy was 5-6 years ago at [facility], but I am unable to find the report in Care Everywhere. On arrival in the ED, he was afebrile and normotensive but tachycardic. Laboratory work up revealed leukocytosis to 17K but was otherwise reassuring. CT Abdomen/Pelvis was concerning for perforated sigmoid diverticulitis with scattered pneumoperitoneum. "  He was treated conservatively with antibiotics.  He was discharged home on a course of oral antibiotics.  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  He continued to have intermittent episodes of abdominal pain, generally coinciding with completion of his antibiotics course.  Several additional CT scans have been performed in the interim, all redemonstrating persistent inflammation of the sigmoid and likely bladder involvement.  Due to the  persistent area suggestive of a contained perforation, gastroenterology declined perform colonoscopy due to the risk of exacerbating the situation.  He also had had a colonoscopy performed 5 to 6 years ago that did not demonstrate any findings that would lead [place] to believe that malignancy was a potential cause for his symptoms.  He is now here today to discuss definitive surgical intervention.  He reports that currently, his abdominal pain is very minimal.  He continues to have diarrhea, which has been persistent since his symptoms initially began.  He also reports urinary urgency as well as occasional episodes of pneumaturia.  He has not had any fevers or chills.  No nausea or vomiting.       Past Medical History:  Diagnosis Date  . Diverticulitis   . History of chicken pox   . History of IBS   . History of measles   . Panic disorder          Past Surgical History:  Procedure Laterality Date  . BACK SURGERY    . WISDOM TOOTH EXTRACTION           Family History  Problem Relation Age of Onset  . Diabetes Maternal Grandmother   . Diabetes Maternal Grandfather   . Lung cancer Paternal Grandmother        Due to Asbestos  . Lung cancer Paternal Grandfather        due to Asbestos  . Heart Problems Paternal Grandfather     Social History Social History        Tobacco Use  .  Smoking status: Former Smoker    Packs/day: 2.00    Years: 16.00    Pack years: 32.00    Types: Cigarettes    Quit date: 10/19/2010    Years since quitting: 9.7  . Smokeless tobacco: Never Used  Substance Use Topics  . Alcohol use: No    Alcohol/week: 0.0 standard drinks  . Drug use: No         Allergies  Allergen Reactions  . Penicillins Anaphylaxis    Has patient had a PCN reaction causing immediate rash, facial/tongue/throat swelling, SOB or lightheadedness with hypotension: Yes Has patient had a PCN reaction causing severe rash involving mucus  membranes or skin necrosis: No Has patient had a PCN reaction that required hospitalization: Patient not sure Has patient had a PCN reaction occurring within the last 10 years: No If all of the above answers are "NO", then may proceed with Cephalosporin use.  . Shellfish Allergy     Throat swells and cant breath          Current Outpatient Medications  Medication Sig Dispense Refill  . albuterol (VENTOLIN HFA) 108 (90 Base) MCG/ACT inhaler Inhale 2 puffs into the lungs every 6 (six) hours as needed for wheezing or shortness of breath. 8 g 1  . Fluticasone-Salmeterol (ADVAIR) 100-50 MCG/DOSE AEPB Inhale 1 puff into the lungs 2 (two) times daily. 1 each 3  . pantoprazole (PROTONIX) 20 MG tablet TAKE ONE TABLET BY MOUTH DAILY 90 tablet 3  . sertraline (ZOLOFT) 50 MG tablet Take 1 tablet (50 mg total) by mouth daily. 90 tablet 4  . bisacodyl (DULCOLAX) 5 MG EC tablet Take all 4 tablets at 8 am the morning prior to your surgery. 4 tablet 0  . metroNIDAZOLE (FLAGYL) 500 MG tablet Take 2 tablets at 8am, take 2 tablets at 2pm, take 2 tablets at 8pm the day prior to surgery. 6 tablet 0  . neomycin (MYCIFRADIN) 500 MG tablet Take 2 tablet at 8am, take 2 tablets at 2pm, and take 2 tablets at 8pm the day prior to your surgery 6 tablet 0  . polyethylene glycol powder (MIRALAX) 17 GM/SCOOP powder Mix full container in 64 ounces of Gatorade or other clear liquid. 238 g 0   No current facility-administered medications for this visit.    Review of Systems Review of Systems  All other systems reviewed and are negative. Or as discussed in the history of present illness.   Today's Vitals   08/14/20 0609  BP: 113/79  Pulse: (!) 106  Resp: 20  Temp: 97.7 F (36.5 C)  TempSrc: Temporal  SpO2: 98%  Weight: 101.2 kg  Height: 5\' 11"  (1.803 m)  PainSc: 0-No pain   Body mass index is 31.1 kg/m.   Physical Exam Physical Exam Constitutional:      General: He is not in acute distress.     Appearance: Normal appearance. He is obese.  HENT:     Head: Normocephalic and atraumatic.     Nose:     Comments: Covered with a mask    Mouth/Throat:     Comments: Covered with a mask Eyes:     General: No scleral icterus.       Right eye: No discharge.        Left eye: No discharge.     Conjunctiva/sclera: Conjunctivae normal.  Neck:     Comments: No palpable thyroid masses or thyromegaly.  The trachea is midline.  There is no palpable cervical or supraclavicular lymphadenopathy.  Cardiovascular:     Rate and Rhythm: Normal rate and regular rhythm.     Pulses: Normal pulses.  Pulmonary:     Effort: Pulmonary effort is normal.     Breath sounds: Normal breath sounds.  Abdominal:     General: Bowel sounds are normal. There is no distension.     Palpations: Abdomen is soft.     Tenderness: There is no guarding or rebound.     Comments: Mild tenderness to deep palpation in the suprapubic area.  Neurological:     Mental Status: He is alert.     Data Reviewed I personally reviewed multiple CT scans, including scans performed on February 11, 2019, Feb 22, 2020, April 08, 2020, April 30, 2020, May 28, 2020, and July 08, 2020.  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  I reviewed the urology clinic note dated June 05, 2020.  Dr. Richardo Hanks saw him and discussed his role in the operative management.  I also reviewed the gastroenterology note from Dr. Maximino Greenland dated 07/08/2020.  She was able to find the procedure note from the patient's prior colonoscopy and confirmed that it was unlikely that there was malignancy contributing to the disease process.  Assessment This is a 44 year old man who has had multiple episodes of diverticulitis over the past year and a half.  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  I have recommended that he undergo sigmoid colectomy.  We will engage urology for stent placement and potential assistance with bladder repair or perivesical dissection.  I explained to him that there is a potential need for either a Hartmann procedure with end colostomy or a potential diverting ileostomy, if there is any concern about the anastomosis.  Plan I discussed the risks of surgery with Mr. Worthington today.  These include, but are not limited to, bleeding, infection, damage to surrounding structures (bladder, bowel, ureters, blood vessels), need for colostomy, need for diverting ileostomy, anastomotic leak, need for additional operations or procedures.  He had the opportunity to ask a number of questions, all of which were answered to his satisfaction.  We will proceed with surgery today.

## 2020-08-14 NOTE — Op Note (Signed)
Operative Note  Preoperative Diagnosis:   Diverticulitis  Postoperative Diagnosis: Same  Operation: Exploratory laparotomy, mobilization of the splenic flexure, partial colectomy with primary stapled anastomosis  Surgeon: Duanne Guess, MD  Assistant: Campbell Lerner, MD (a second surgeon was necessary for exposure and technical assistance in this challenging case)  Anesthesia: General endotracheal  Findings: There was a fairly short segment of sigmoid colon involved with disease.  It was densely adherent to the bladder.  The remainder of the colon both proximal and distal to this area was quite healthy and uninvolved with diverticular disease.  Post resection, the bladder was filled with saline tinted with methylene blue and no leak was appreciated.  Our leak test demonstrated a posterior leak which was closed with Lembert sutures.  Subsequent leak test confirmed that the site of leakage had been closed.  Indications: This is a 44 year old man who has had multiple episodes of acute diverticulitis over the past couple of years.  He was hospitalized in June with perforated diverticulitis (contained).  He was treated conservatively with IV antibiotics.  Upon discharge, anytime he was taken off oral antibiotics, he developed worsening pain.  He also reported occasional episodes of pneumaturia.  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  It was recommended that he undergo exploratory laparotomy with resection of the involved portion of his colon.  He was aware that there was a possibility that he would require either a colostomy or diverting ileostomy.  Urology was engaged for stent placement as well as assistance with any bladder repair, if it became necessary.  The risks of the operation were discussed with him in detail and he agreed to proceed.  Procedure In Detail: The patient was identified in the preoperative holding area and brought to the operating room where he was placed  supine on the OR table.  All bony prominences were padded and bilateral sequential compression devices were placed on the lower extremities.  General endotracheal anesthesia was induced without incident.  The patient was positioned in lithotomy and urology performed their portion of the procedure.  Please see Dr. Keane Scrape operative note for details.  After the stents were in place, the yellow fins were lowered to take pressure off of his hips.  His abdomen was then sterilely prepped and draped in standard fashion.  A timeout was performed confirming his identity, the procedure being performed, his allergies, all necessary equipment was available, and that maintenance anesthesia was adequate.  He received perioperative antibiotic prophylaxis.  A vertical midline incision was made and carried down to the fascia using fat splitting technique and electrocautery.  The fascia was divided along the linea alba.  The peritoneum was grasped and sharply incised.  No adhesions were appreciated on the underside of the peritoneum and her incision was opened completely.  The abdomen was explored and the only finding of significance was the segment of sigmoid colon that was densely adherent to the bladder.  A self retaining retractor was placed for exposure.  The small bowel was tucked away in the right upper quadrant and the patient placed in slight Trendelenburg.  A combination of blunt dissection, electrocautery, and finger fracture was used 2 separate the colon from the bladder.  We then opened the peritoneum along the white line of Toldt all the way up to the splenic flexure.  We then carefully took down the attachments of the colon at the splenic flexure and opened the lesser sac to fully mobilize the left lateral transverse colon.  As  we further dissected towards the pelvis, we identified the ureter and took care to avoid injury.  Once the transverse and descending colon were adequately mobilized, we found that we had  ample length to reach into the pelvis.  We then continued to dissect out the area of inflammation until we had circumferentially freed the sigmoid colon.  Just distal to the area of inflammation adjacent to the bladder, the colon was soft and felt quite normal.  This was just above the level of the sacral promontory.  A contour stapler was used to divide the sigmoid at this location and a GIA was used for the proximal division.  The sigmoid was handed off as a specimen.  We then prepared to create our anastomosis.  Due to the length of rectosigmoid, we elected to perform an end-to-side anastomosis.  After clearing fat off of the distal sigmoid, pursestring or was used to create a suture line and the staple line was excised.  The dilators were then introduced; the largest that fit comfortably without undue tissue tension was a 25 mm.  The anvil from a 25 mm EEA stapler was secured within the rectosigmoid stump.  The proximal staple line was then opened and the EEA stapler introduced.  The spike was brought out through the bare area where the tenia coli were.  The spike and anvil were then connected and the stapler fired.  It was removed and the anastomotic donuts were inspected.  The proximal donut was intact however the distal donut had an area that was incomplete.  A second firing of the GIA stapler was used to close the end of the proximal colon.  The anastomosis was then imbricated with Lembert sutures.  Dr. Claudine Mouton went below and first we checked the integrity of the bladder by filling it with 300 cc of methylene blue dyed saline.  No leak was seen.  The pelvis was then filled with saline and a leak test performed.  There was a posterior anastomotic leak.  This was repaired with multiple additional Lembert sutures.  After the repair, we repeated the leak test and no leak was appreciated at that point.  A #19 round Blake drain was placed in the pelvis adjacent to the anastomosis and brought out through the  lateral abdominal wall.  It was sutured in place with 3-0 nylon.  The abdomen was thoroughly irrigated.  Hemostasis was confirmed.  We then prepared for clean closure by changing her gown and gloves and removing all contaminated items from the surgical field.  The fascia was closed with #1 PDS.  The subcutaneum was irrigated and the skin reapproximated with staples.  Dressings were applied.  The patient was awakened, extubated, and taken to the post anesthesia care unit in good condition.  EBL: 30 cc  IVF: 2 L of crystalloid  UOP: 350 cc via Foley catheter  Specimen(s): Sigmoid colon  Complications: none immediately apparent.   Counts: all needles, instruments, and sponges were counted and reported to be correct in number at the end of the case.   I was present for and participated in the entire operation.  Duanne Guess 12:54 PM

## 2020-08-14 NOTE — Op Note (Signed)
Date of procedure: 08/14/20  Preoperative diagnosis:  1. Diverticulitis 2. Possible colovesical fistula  Postoperative diagnosis:  1. Diverticulitis  Procedure: 1. Cystoscopy, bladder biopsy and fulguration, bilateral ureteral access catheter placement  Surgeon: Nickolas Madrid, MD  Anesthesia: General  Complications: None  Intraoperative findings:  1.  Significant erythema at posterior bladder wall consistent with suspected fistula, biopsied and fulgurated 2.  Uncomplicated bilateral ureteral access catheter placement  EBL: Minimal  Specimens: Bladder biopsy for urology portion of case  Drains: Bilateral ureteral access catheters, 20 French two-way Foley  Indication: Derek Ramirez is a 44 y.o. patient with recurrent diverticulitis, no evidence of malignancy on colonoscopy who presents for bowel resection with general surgery.  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  Urology was requested to place bilateral ureteral access catheters, as well as be available for possible colovesical fistula repair.  After reviewing the management options for treatment, they elected to proceed with the above surgical procedure(s). We have discussed the potential benefits and risks of the procedure, side effects of the proposed treatment, the likelihood of the patient achieving the goals of the procedure, and any potential problems that might occur during the procedure or recuperation. Informed consent has been obtained.  Description of procedure:  The patient was taken to the operating room and general anesthesia was induced. SCDs were placed for DVT prophylaxis. The patient was placed in the dorsal lithotomy position, prepped and draped in the usual sterile fashion, and preoperative antibiotics(Cipro and Flagyl) were administered. A preoperative time-out was performed.   A 21 French rigid cystoscope was used to intubate  the urethra and a normal-appearing urethra was followed proximally into the bladder.  Thorough cystoscopy was performed.  There was 3 cm of bullous erythema at the posterior bladder wall/dome consistent with suspected fistula in his history of diverticulitis.  Secondary to the degree of erythema, I did opt to perform a biopsy and fulguration.  The cold cup biopsy forceps were used to take a biopsy at the posterior wall of the abnormal tissue, and the Bugbee was used to achieve meticulous hemostasis.  I then turned my attention to the ureteral orifices which were orthotopic bilaterally.  A sensor wire was advanced into the left ureteral orifice and advanced up to the kidney, and an access catheter was advanced over the wire until subtle resistance was met.  The wire was removed and the scope was backed out.  I then reinserted the scope and turned my attention to the right ureteral orifice and identical procedure was performed.  At no point was resistance met with the wire or the access catheter.  A 20 [place] two-way Foley passed easily with return of clear fluid and 10 cc were placed in the balloon.  The catheter was hubbed at the bladder neck, and the access catheters were plugged into the Y adapter, and secured to the Foley catheter with a silk tie.  The case was then turned over to general surgery.  After dissection of the disease bowel, they reported there was no evidence of colovesical fistula, and no evidence of leak when the bladder was filled with methylene blue dyed saline.   Disposition: Stable to PACU  Plan: Follow-up bladder biopsy pathology I removed both access catheters in PACU  Nickolas Madrid, MD

## 2020-08-14 NOTE — H&P (Signed)
UROLOGY H&P UPDATE  Agree with prior H&P by Dr. Lady Gary. 44 yo M with recurrent diverticulitis managed with antibiotics, here today for bowel resection with general surgery.  Preoperative imaging suggest possible colovesical fistula, and urology involved for bilateral ureteral access catheter placement and possible bladder repair.  I personally reviewed the preoperative imaging that shows no evidence of hydronephrosis.  Cardiac: RRR Lungs: CTA bilaterally  Laterality: Bilateral Procedure: Cystoscopy, bilateral ureteral access catheter placement  Informed consent obtained, we specifically discussed the risks of bleeding, infection, post-operative pain, possible need for temporary catheter placement, urine leak, ureteral injury/re-implant, need for additional procedures.  Sondra Come, MD 08/14/2020

## 2020-08-14 NOTE — Anesthesia Procedure Notes (Signed)
Procedure Name: Intubation Date/Time: 08/14/2020 7:43 AM Performed by: Clyde Lundborg, CRNA Pre-anesthesia Checklist: Patient identified, Emergency Drugs available, Suction available and Patient being monitored Patient Re-evaluated:Patient Re-evaluated prior to induction Oxygen Delivery Method: Circle system utilized Preoxygenation: Pre-oxygenation with 100% oxygen Induction Type: IV induction Ventilation: Mask ventilation without difficulty Laryngoscope Size: McGraph and 3 Grade View: Grade I Tube type: Oral Tube size: 7.5 mm Number of attempts: 1 Airway Equipment and Method: Video-laryngoscopy Placement Confirmation: ETT inserted through vocal cords under direct vision,  positive ETCO2,  breath sounds checked- equal and bilateral and CO2 detector Secured at: 21 cm Tube secured with: Tape Dental Injury: Teeth and Oropharynx as per pre-operative assessment

## 2020-08-14 NOTE — Transfer of Care (Signed)
Immediate Anesthesia Transfer of Care Note  Patient: Derek Ramirez  Procedure(s) Performed: EXPLORATORY LAPAROTOMY WITH MOBILIZATION OF SPLENIC FLEXURE (N/A ) SIGMOID COLECTOMY CYSTOSCOPY WITH STENT PLACEMENT (Bilateral ) CYSTOSCOPY WITH BIOPSY  Patient Location: PACU  Anesthesia Type:General  Level of Consciousness: awake, alert  and oriented  Airway & Oxygen Therapy: Patient Spontanous Breathing  Post-op Assessment: Report given to RN and Post -op Vital signs reviewed and stable  Post vital signs: Reviewed and stable  Last Vitals:  Vitals Value Taken Time  BP    Temp    Pulse    Resp    SpO2      Last Pain:  Vitals:   08/14/20 0609  TempSrc: Temporal  PainSc: 0-No pain         Complications: No complications documented.

## 2020-08-15 ENCOUNTER — Encounter: Payer: Self-pay | Admitting: General Surgery

## 2020-08-15 LAB — BASIC METABOLIC PANEL
Anion gap: 6 (ref 5–15)
BUN: 13 mg/dL (ref 6–20)
CO2: 25 mmol/L (ref 22–32)
Calcium: 8.7 mg/dL — ABNORMAL LOW (ref 8.9–10.3)
Chloride: 105 mmol/L (ref 98–111)
Creatinine, Ser: 0.99 mg/dL (ref 0.61–1.24)
GFR, Estimated: >60 mL/min (ref >60)
Glucose, Bld: 133 mg/dL — ABNORMAL HIGH (ref 70–99)
Potassium: 4.3 mmol/L (ref 3.5–5.1)
Sodium: 136 mmol/L (ref 135–145)

## 2020-08-15 LAB — CBC
HCT: 37.5 % — ABNORMAL LOW (ref 39.0–52.0)
Hemoglobin: 12.7 g/dL — ABNORMAL LOW (ref 13.0–17.0)
MCH: 28.2 pg (ref 26.0–34.0)
MCHC: 33.9 g/dL (ref 30.0–36.0)
MCV: 83.3 fL (ref 80.0–100.0)
Platelets: 252 10*3/uL (ref 150–400)
RBC: 4.5 MIL/uL (ref 4.22–5.81)
RDW: 13.8 % (ref 11.5–15.5)
WBC: 11.8 10*3/uL — ABNORMAL HIGH (ref 4.0–10.5)
nRBC: 0 % (ref 0.0–0.2)

## 2020-08-15 LAB — SURGICAL PATHOLOGY

## 2020-08-15 LAB — MAGNESIUM: Magnesium: 2.1 mg/dL (ref 1.7–2.4)

## 2020-08-15 LAB — PHOSPHORUS: Phosphorus: 3.4 mg/dL (ref 2.5–4.6)

## 2020-08-15 MED ORDER — ACETAMINOPHEN 500 MG PO TABS
ORAL_TABLET | ORAL | Status: AC
  Administered 2020-08-15: 1000 mg via ORAL
  Filled 2020-08-15: qty 2

## 2020-08-15 MED ORDER — ALVIMOPAN 12 MG PO CAPS
ORAL_CAPSULE | ORAL | Status: AC
  Administered 2020-08-15: 12 mg via ORAL
  Filled 2020-08-15: qty 1

## 2020-08-15 MED ORDER — KETOROLAC TROMETHAMINE 30 MG/ML IJ SOLN
INTRAMUSCULAR | Status: AC
  Filled 2020-08-15: qty 1

## 2020-08-15 MED ORDER — HYDROMORPHONE HCL 1 MG/ML IJ SOLN
INTRAMUSCULAR | Status: AC
  Filled 2020-08-15: qty 0.5

## 2020-08-15 MED ORDER — HYDROMORPHONE HCL 1 MG/ML IJ SOLN
INTRAMUSCULAR | Status: AC
  Administered 2020-08-15: 0.5 mg via INTRAVENOUS
  Filled 2020-08-15: qty 0.5

## 2020-08-15 MED ORDER — HYDROMORPHONE HCL 1 MG/ML IJ SOLN
INTRAMUSCULAR | Status: AC
  Administered 2020-08-15: 0.5 mg via INTRAVENOUS
  Filled 2020-08-15: qty 1

## 2020-08-15 MED ORDER — ACETAMINOPHEN 500 MG PO TABS
ORAL_TABLET | ORAL | Status: AC
  Filled 2020-08-15: qty 2

## 2020-08-15 MED ORDER — CELECOXIB 200 MG PO CAPS
ORAL_CAPSULE | ORAL | Status: AC
  Administered 2020-08-15: 200 mg via ORAL
  Filled 2020-08-15: qty 1

## 2020-08-15 MED ORDER — GABAPENTIN 300 MG PO CAPS
ORAL_CAPSULE | ORAL | Status: AC
  Administered 2020-08-15: 300 mg via ORAL
  Filled 2020-08-15: qty 1

## 2020-08-15 NOTE — Anesthesia Postprocedure Evaluation (Signed)
Anesthesia Post Note  Patient: Cashton Hosley  Procedure(s) Performed: EXPLORATORY LAPAROTOMY WITH MOBILIZATION OF SPLENIC FLEXURE (N/A ) SIGMOID COLECTOMY CYSTOSCOPY WITH STENT PLACEMENT (Bilateral ) CYSTOSCOPY WITH BIOPSY  Patient location during evaluation: PACU Anesthesia Type: General Level of consciousness: awake and alert and oriented Pain management: pain level controlled Vital Signs Assessment: post-procedure vital signs reviewed and stable Respiratory status: spontaneous breathing Cardiovascular status: blood pressure returned to baseline Anesthetic complications: no   No complications documented.   Last Vitals:  Vitals:   08/15/20 1659 08/15/20 1949  BP: 121/79 126/78  Pulse: 70 83  Resp: 18 18  Temp: (!) 36.1 C 36.8 C  SpO2: 98% 98%    Last Pain:  Vitals:   08/15/20 1949  TempSrc: Oral  PainSc:                  Ekam Besson

## 2020-08-15 NOTE — Progress Notes (Addendum)
[facility] SURGICAL PROGRESS NOTE  Hospital Day(s): 1.   Post op day(s): 1 Day Post-Op.   Interval History: Patient seen and examined no acute events or new complaints overnight.  Patient reports he is doing well, He does have some abdominal soreness but this is controlled with pain medications He denied any fever, chills, nausea, emesis Mild, likely reactive, improved leukocytosis to 11.8K this morning; no fevers Renal function remains normal, sCr - 0.99, UO - 1.5L No electrolyte derangements  Surgical drain with 160 ccs out; serosanguinous  He has been NPO since surgery He did mobilize last night  Vital signs in last 24 hours: [min-max] current  Temp:  [97.9 F (36.6 C)-100.1 F (37.8 C)] 97.9 F (36.6 C) (10/28 0700) Pulse Rate:  [67-102] 73 (10/28 0700) Resp:  [11-18] 18 (10/28 0700) BP: (107-133)/(70-88) 107/70 (10/28 0700) SpO2:  [91 %-100 %] 97 % (10/28 0700)     Height: 5\' 11"  (180.3 cm) Weight: 101.2 kg BMI (Calculated): 31.12   Intake/Output last 2 shifts:  10/27 0701 - 10/28 0700 In: 2820 [P.O.:20; I.V.:2700; IV Piggyback:100] Out: 1740 [Urine:1550; Drains:160; Blood:30]   Physical Exam:  Constitutional: alert, cooperative and no distress  Respiratory: breathing non-labored at rest  Cardiovascular: regular rate and sinus rhythm  Gastrointestinal: Soft, non-tender, and non-distended, no rebound/guarding. Surgical drain in left mid-abdomen with serosanguinous output Genitourinary: Foley in place Integumentary: Laparotomy incision with island dressing in place, no drainage  Musculoskeletal: no edema or wounds, motor and sensation grossly intact, NT    Labs:  CBC Latest Ref Rng & Units 08/15/2020 08/14/2020 04/09/2020  WBC 4.0 - 10.5 K/uL 11.8(H) 12.0(H) 9.3  Hemoglobin 13.0 - 17.0 g/dL 12.7(L) 14.2 13.2  Hematocrit 39 - 52 % 37.5(L) 42.1 38.3(L)  Platelets 150 - 400 K/uL 252 289 210   CMP Latest Ref Rng & Units 08/15/2020 08/14/2020  04/09/2020  Glucose 70 - 99 mg/dL 04/11/2020) - 99  BUN 6 - 20 mg/dL 13 - 12  Creatinine 161(W - 1.24 mg/dL 9.60 4.54 0.98  Sodium 135 - 145 mmol/L 136 - 136  Potassium 3.5 - 5.1 mmol/L 4.3 - 3.7  Chloride 98 - 111 mmol/L 105 - 105  CO2 22 - 32 mmol/L 25 - 24  Calcium 8.9 - 10.3 mg/dL 1.19) - 8.5(L)  Total Protein 6.5 - 8.1 g/dL - - -  Total Bilirubin 0.3 - 1.2 mg/dL - - -  Alkaline Phos 38 - 126 U/L - - -  AST 15 - 41 U/L - - -  ALT 0 - 44 U/L - - -    Imaging studies: No new pertinent imaging studies   Assessment/Plan:  44 y.o. male doing well 1 Day Post-Op s/p exploratory laparotomy, sigmoid colectomy, and primary anastomosis for complicated diverticulitis.   - Okay to trial on CLD; awaiting bowel function resumption prior to advancing  - Continue Entereg until bowel function returns  - Continue IVF resuscitation  - Discontinue foley today   - Monitor abdominal examination; on-going bowel function  - Pain control prn; antiemetics prn  - Continue surgical drain; monitor and record output  - Monitor CBC, BMP  - Mobilization encoruaged   - DVT Prophylaxis    All of the above findings and recommendations were discussed with the patient, and the medical team, and all of patient's questions were answered to his expressed satisfaction.   -- 48, PA-C [facility] 08/15/2020, 8:16 AM [phone] M-F: 7am - 4pm I saw and evaluated the patient.  I agree with the above documentation, exam, and plan, which I have edited where appropriate. Duanne Guess  3:09 PM

## 2020-08-16 LAB — BASIC METABOLIC PANEL
Anion gap: 6 (ref 5–15)
BUN: 10 mg/dL (ref 6–20)
CO2: 25 mmol/L (ref 22–32)
Calcium: 8.5 mg/dL — ABNORMAL LOW (ref 8.9–10.3)
Chloride: 106 mmol/L (ref 98–111)
Creatinine, Ser: 0.97 mg/dL (ref 0.61–1.24)
GFR, Estimated: >60 mL/min (ref >60)
Glucose, Bld: 103 mg/dL — ABNORMAL HIGH (ref 70–99)
Potassium: 3.6 mmol/L (ref 3.5–5.1)
Sodium: 137 mmol/L (ref 135–145)

## 2020-08-16 LAB — CBC
HCT: 36 % — ABNORMAL LOW (ref 39.0–52.0)
Hemoglobin: 12 g/dL — ABNORMAL LOW (ref 13.0–17.0)
MCH: 27.9 pg (ref 26.0–34.0)
MCHC: 33.3 g/dL (ref 30.0–36.0)
MCV: 83.7 fL (ref 80.0–100.0)
Platelets: 248 10*3/uL (ref 150–400)
RBC: 4.3 MIL/uL (ref 4.22–5.81)
RDW: 13.9 % (ref 11.5–15.5)
WBC: 9.2 10*3/uL (ref 4.0–10.5)
nRBC: 0 % (ref 0.0–0.2)

## 2020-08-16 MED ORDER — OXYCODONE HCL 5 MG PO TABS
5.0000 mg | ORAL_TABLET | ORAL | Status: DC | PRN
  Administered 2020-08-16 – 2020-08-17 (×4): 5 mg via ORAL
  Filled 2020-08-16 (×4): qty 1

## 2020-08-16 NOTE — Progress Notes (Addendum)
[facility] SURGICAL PROGRESS NOTE  Hospital Day(s): 2.   Post op day(s): 2 Days Post-Op.   Interval History:  Patient seen and examined no acute events or new complaints overnight.  Patient up walking around room Abdominal soreness improving No fever, chills, nausea, emesis Leukocytosis resolved now, likely reactive from OR, down to 9.2K Mild anemia with Hgb of 12.0, suspect this is dilutional Renal function remains normal, sCr -  0.97, UO 800 + unmeasured No significant electrolyte derangements Surgical drain with 35 ccs out; serosanguinous  He was started on CLD yesterday; tolerating well He did have a BM recorded overnight Mobilizing without issues  Vital signs in last 24 hours: [min-max] current  Temp:  [97 F (36.1 C)-98.5 F (36.9 C)] 97.7 F (36.5 C) (10/29 0420) Pulse Rate:  [70-83] 70 (10/29 0420) Resp:  [18-20] 20 (10/29 0420) BP: (112-139)/(76-91) 122/83 (10/29 0420) SpO2:  [97 %-98 %] 97 % (10/29 0420)     Height: 5\' 11"  (180.3 cm) Weight: 101.2 kg BMI (Calculated): 31.12   Intake/Output last 2 shifts:  10/28 0701 - 10/29 0700 In: 480 [P.O.:480] Out: 866 [Urine:801; Drains:35]   Physical Exam:  Constitutional: alert, cooperative and no distress  Respiratory: breathing non-labored at rest  Cardiovascular: regular rate and sinus rhythm  Gastrointestinal: Soft, non-tender, and non-distended, no rebound/guarding. Surgical drain in left mid-abdomen with serosanguinous output Integumentary: Laparotomy incision CDI with staples, no erythema, no drainage  Musculoskeletal: no edema or wounds, motor and sensation grossly intact, NT   Labs:  CBC Latest Ref Rng & Units 08/16/2020 08/15/2020 08/14/2020  WBC 4.0 - 10.5 K/uL 9.2 11.8(H) 12.0(H)  Hemoglobin 13.0 - 17.0 g/dL 12.0(L) 12.7(L) 14.2  Hematocrit 39 - 52 % 36.0(L) 37.5(L) 42.1  Platelets 150 - 400 K/uL 248 252 289   CMP Latest Ref Rng & Units 08/16/2020 08/15/2020 08/14/2020  Glucose 70 -  99 mg/dL 08/16/2020) 604(V) -  BUN 6 - 20 mg/dL 10 13 -  Creatinine 409(W - 1.24 mg/dL 1.19 1.47 8.29  Sodium 135 - 145 mmol/L 137 136 -  Potassium 3.5 - 5.1 mmol/L 3.6 4.3 -  Chloride 98 - 111 mmol/L 106 105 -  CO2 22 - 32 mmol/L 25 25 -  Calcium 8.9 - 10.3 mg/dL 5.62) 1.3(Y) -  Total Protein 6.5 - 8.1 g/dL - - -  Total Bilirubin 0.3 - 1.2 mg/dL - - -  Alkaline Phos 38 - 126 U/L - - -  AST 15 - 41 U/L - - -  ALT 0 - 44 U/L - - -    Imaging studies: No new pertinent imaging studies   Assessment/Plan:  44 y.o. male overall doing well 2 Days Post-Op s/p exploratory laparotomy, sigmoid colectomy, and primary anastomosis for complicated diverticulitis.   - Advance to FLD; ADAT today             - Discontinue Entereg              - Discontinue IVF resuscitation   - Monitor abdominal examination; on-going bowel function             - Pain control prn; antiemetics prn             - Continue surgical drain; monitor and record output   - Mobilization encoruaged              - DVT Prophylaxis     - If tolerates advancement of diet today; Okay for discharge tomorrow, will need follow up  in 1 week for staple removal +/- drain removal   All of the above findings and recommendations were discussed with the patient, patient's family (wife at bedside), and the medical team, and all of patient's and family's questions were answered to their expressed satisfaction.  -- Lynden Oxford, PA-C [facility] 08/16/2020, 7:21 AM [phone] M-F: 7am - 4pm  I saw and evaluated the patient.  I agree with the above documentation, exam, and plan, which I have edited where appropriate. Duanne Guess  10:50 AM

## 2020-08-16 NOTE — Plan of Care (Signed)
Continuing with plan of care. 

## 2020-08-16 NOTE — Progress Notes (Signed)
Patient successfully emptied JP drain and back to charged suction with no difficulties.

## 2020-08-17 MED ORDER — OXYCODONE HCL 5 MG PO TABS: 5.0000 mg | ORAL_TABLET | ORAL | 0 refills | Status: DC | PRN

## 2020-08-17 MED ORDER — METHOCARBAMOL 500 MG PO TABS: 500.0000 mg | ORAL_TABLET | Freq: Four times a day (QID) | ORAL | 0 refills | Status: DC | PRN

## 2020-08-17 NOTE — Discharge Instructions (Signed)
  Diet: Resume soft low fiber diet for 2 weeks. After two weeks, start regular high fiber diet. It is preferred to have frequent small meals than having 3 big meals a day. Drink at least 8 glasses of water daily.   Activity: No heavy lifting >10 pounds (children, pets, laundry, garbage) or strenuous activity for the next 6 weeks, but light activity and walking are encouraged.   Do not drive for four weeks.  It is normal to feel tire most of the time since your body is using energy to heal.    Wound care: May shower with soapy water and pat dry (do not rub incisions), but no baths or submerging incision underwater until follow-up. (no swimming)   Do not smoke, since smoking delays the process of healing, among other negative effects.   Call the office if the wound becomes red and start to drain pus.   Make sure to measure the drains daily and write it down to be given to the physician on the next appointment.   Medications: Resume all home medications. For mild to moderate pain: acetaminophen (Tylenol) or ibuprofen (if no kidney disease). Combining Tylenol with alcohol can substantially increase your risk of causing liver disease. Narcotic pain medications, if prescribed, can be used for severe pain, though may cause nausea, constipation, and drowsiness. Do not combine Tylenol and Percocet within a 6 hour period as Percocet contains Tylenol. If you do not need the narcotic pain medication, you do not need to fill the prescription. Do not drink alcohol while taking narcotics.   Call office [phone]) at any time if any questions, worsening pain, fevers/chills, bleeding, drainage from incision site, or other concerns.

## 2020-08-17 NOTE — Progress Notes (Signed)
Mobility Specialist - Progress Note   08/17/20 1100  Mobility  Activity Ambulated in hall  Level of Assistance Independent  Assistive Device None  Distance Ambulated (ft) 180 ft  Mobility Response Tolerated well  Mobility performed by Mobility specialist  $Mobility charge 1 Mobility    Pre-mobility: 94 HR, 97% SpO2 During mobility: 127 HR, 97% SpO2 Post-mobility: 128 HR, 98% SpO2   Pt was lying in bed upon arrival with wife present in room. Pt agreed to session. Pt is independent in all transfers, including ambulation. Pt ambulated 180' in room/hallway with no LOB. O2 > 96% throughout session. No complaints of fatigue or weakness during ambulation. Steady gait and good posture. Carries out conversation throughout activity with no heavy breathing noted. Overall, pt tolerated session well. Pt was left in bed with all needs in reach.    [name] Mobility Specialist 08/17/20, 11:08 AM

## 2020-08-17 NOTE — Discharge Summary (Signed)
  Patient ID: Ukiah Trawick MRN: [id number] DOB/AGE: [birth year] 44 y.o.  Admit date: 08/14/2020 Discharge date: 08/17/2020   Discharge Diagnoses:  Active Problems:   S/P partial colectomy   Procedures: Partial colectomy with anastomosis  Hospital Course: Patient with history of complicated diverticulitis.  He underwent partial colectomy with anastomosis.  He tolerated the procedure well.  Today the patient is ambulating, tolerating soft diet, voiding spontaneously, pain controlled with current oral pain medications and passing gas and having bowel movement.  The wound is dry and clean.  Drain with serosanguineous output.  Physical Exam Cardiovascular:     Rate and Rhythm: Normal rate and regular rhythm.     Pulses: Normal pulses.  Pulmonary:     Effort: Pulmonary effort is normal.  Abdominal:     General: Abdomen is flat. Bowel sounds are normal. There is no distension.     Tenderness: There is no abdominal tenderness. There is no guarding.  Neurological:     Mental Status: He is alert and oriented to person, place, and time.   Wounds are dry and clean   Consults: None  Disposition: Discharge disposition: 01-Home or Self Care       Discharge Instructions    Diet - low sodium heart healthy   Complete by: As directed      Allergies as of 08/17/2020      Reactions   Penicillins Anaphylaxis   Has patient had a PCN reaction causing immediate rash, facial/tongue/throat swelling, SOB or lightheadedness with hypotension: Yes Has patient had a PCN reaction causing severe rash involving mucus membranes or skin necrosis: No Has patient had a PCN reaction that required hospitalization No Patient not sure Has patient had a PCN reaction occurring within the last 10 years: No If all of the above answers are "NO", then may proceed with Cephalosporin use.   Shellfish Allergy Anaphylaxis      Medication List    STOP taking these medications   albuterol 108 (90 Base) MCG/ACT  inhaler Commonly known as: VENTOLIN HFA   Fluticasone-Salmeterol 100-50 MCG/DOSE Aepb Commonly known as: ADVAIR   pantoprazole 20 MG tablet Commonly known as: PROTONIX   sertraline 50 MG tablet Commonly known as: ZOLOFT     TAKE these medications   methocarbamol 500 MG tablet Commonly known as: ROBAXIN Take 1 tablet (500 mg total) by mouth every 6 (six) hours as needed for muscle spasms.   oxyCODONE 5 MG immediate release tablet Commonly known as: Oxy IR/ROXICODONE Take 1 tablet (5 mg total) by mouth every 4 (four) hours as needed for moderate pain.       Follow-up Information    Donovan Kail, PA-C Follow up in 1 week(s).   Specialty: Physician Assistant Why: Call [facility] office for time and date confirmation Contact information: [address] [place] [address] [phone]              This discharge encounter was more than 30 minutes most of the time counseling the patient and coordinating plan of care.

## 2020-08-17 NOTE — Plan of Care (Signed)
Discharge teaching reviewed with patient and his wife.  Patient continues to successfully mange the JP drain and dressing teaching was completed as well.  Patient is in stable condition.

## 2020-08-20 ENCOUNTER — Encounter: Payer: Self-pay | Admitting: General Surgery

## 2020-08-22 ENCOUNTER — Other Ambulatory Visit: Payer: Self-pay

## 2020-08-22 ENCOUNTER — Ambulatory Visit (INDEPENDENT_AMBULATORY_CARE_PROVIDER_SITE_OTHER): Payer: No Typology Code available for payment source | Admitting: Physician Assistant

## 2020-08-22 ENCOUNTER — Encounter: Payer: Self-pay | Admitting: Physician Assistant

## 2020-08-22 VITALS — BP 131/78 | HR 118 | Temp 97.9°F | Resp 12 | Wt 222.0 lb

## 2020-08-22 DIAGNOSIS — Z09 Encounter for follow-up examination after completed treatment for conditions other than malignant neoplasm: Secondary | ICD-10-CM

## 2020-08-22 DIAGNOSIS — K5720 Diverticulitis of large intestine with perforation and abscess without bleeding: Secondary | ICD-10-CM

## 2020-08-22 DIAGNOSIS — Z9049 Acquired absence of other specified parts of digestive tract: Secondary | ICD-10-CM

## 2020-08-22 MED ORDER — SULFAMETHOXAZOLE-TRIMETHOPRIM 400-80 MG PO TABS: 1.0000 | ORAL_TABLET | Freq: Two times a day (BID) | ORAL | 0 refills | Status: AC

## 2020-08-22 NOTE — Progress Notes (Signed)
[facility] POST-OP OFFICE VISIT  08/22/2020  HPI: Derek Ramirez is a 44 y.o. male 8 days s/p exploratory laparotomy, mobilization of the splenic flexure, partial colectomy with primary stapled anastomosis for complicated diverticulitis with Dr Lady Gary.   He reports that overall he has been doing well since the surgery aside from expect soreness which he is managing with tylenol, robaxin, and oxycodone. However, in the last 48 hours, he has noticed increased drainage from his midline wound and some erythema around the skin edges. No fever or chills. His drainage from his surgical drain has slowed to <10 ccs daily and is serous in nature. No nausea, emesis, or bowel changes. Decreased appetite but tolerating PO intake. No other complaints.   Vital signs: BP 131/78   Pulse (!) 118   Temp 97.9 F (36.6 C)   Resp 12   Wt 222 lb (100.7 kg)   SpO2 97%   BMI 30.96 kg/m    Physical Exam: Constitutional: Well appearing male, NAD Abdomen: Abdomen is soft, expected incisional soreness, non-distended, no rebound/guarding. Surgical drain in the left mid-abdomen with serous output (this was removed today) Skin: Laparotomy incision intact with staples. He had varying degrees of ecchymosis throughout. He did have drainage and erythema around his umbilicus and the inferior half of the incision. Once I removed the staple, he has immediate expression of grossly purulent drainage. The fascia remained intact.   Assessment/Plan: This is a 44 y.o. male 8 days s/p exploratory laparotomy, mobilization of the splenic flexure, partial colectomy with primary stapled anastomosis for complicated diverticulitis   - I removed staples which revealed underlying wound infection. Purulent drainage was cultured. The wound was opened and irrigated copiously. I did review wound care and dressing changes with the patient and his wife at bedside who voiced understanding. I will send him 10 days of Bactrim and follow  up Cx results as they are available.  - Surgical drain removed at bedside  - Reviewed pathology results with the patient and his wife  - Continue pain regimen; encouraged adding ibuprofen into his regimen and saving narcotics for dressing changes  - Reviewed lifting restrictions  - I will see him weekly for now for wound care  -- Lynden Oxford, PA-C [facility] 08/22/2020, 10:19 AM [phone] M-F: 7am - 4pm

## 2020-08-22 NOTE — Patient Instructions (Signed)
Pick up your prescription at your local pharmacy.   Derek Ramirez removed your drain and the staples. Recommended shower in the morning, removed the dressing and let water and soap run over the area. The small wound where the drain was don't remove the gauze until Saturday and then wear a bandaide as needed.   After showering, you will need to pack the wound as Derek Ramirez showed you. Use sterile water to wet the gauze, pack the area with the gauze and cover with dry gauze and tape the area. You will need to do this once a day.   See your appointment below, call the office if you have any questions or concerns.

## 2020-08-23 ENCOUNTER — Other Ambulatory Visit: Payer: Self-pay

## 2020-08-23 ENCOUNTER — Encounter: Payer: Self-pay | Admitting: Emergency Medicine

## 2020-08-23 ENCOUNTER — Emergency Department
Admission: EM | Admit: 2020-08-23 | Discharge: 2020-08-24 | Disposition: A | Discharge status: Home / Self Care | Payer: No Typology Code available for payment source | Attending: Emergency Medicine | Admitting: Emergency Medicine

## 2020-08-23 DIAGNOSIS — Z87891 Personal history of nicotine dependence: Secondary | ICD-10-CM | POA: Diagnosis not present

## 2020-08-23 DIAGNOSIS — T8141XD Infection following a procedure, superficial incisional surgical site, subsequent encounter: Secondary | ICD-10-CM

## 2020-08-23 DIAGNOSIS — T8141XA Infection following a procedure, superficial incisional surgical site, initial encounter: Secondary | ICD-10-CM | POA: Insufficient documentation

## 2020-08-23 DIAGNOSIS — J45909 Unspecified asthma, uncomplicated: Secondary | ICD-10-CM | POA: Insufficient documentation

## 2020-08-23 DIAGNOSIS — L7622 Postprocedural hemorrhage and hematoma of skin and subcutaneous tissue following other procedure: Secondary | ICD-10-CM | POA: Diagnosis present

## 2020-08-23 LAB — CBC
HCT: 38.2 % — ABNORMAL LOW (ref 39.0–52.0)
Hemoglobin: 12.7 g/dL — ABNORMAL LOW (ref 13.0–17.0)
MCH: 27.4 pg (ref 26.0–34.0)
MCHC: 33.2 g/dL (ref 30.0–36.0)
MCV: 82.5 fL (ref 80.0–100.0)
Platelets: 431 10*3/uL — ABNORMAL HIGH (ref 150–400)
RBC: 4.63 MIL/uL (ref 4.22–5.81)
RDW: 13.8 % (ref 11.5–15.5)
WBC: 12.8 10*3/uL — ABNORMAL HIGH (ref 4.0–10.5)
nRBC: 0 % (ref 0.0–0.2)

## 2020-08-23 LAB — COMPREHENSIVE METABOLIC PANEL
ALT: 33 U/L (ref 0–44)
AST: 22 U/L (ref 15–41)
Albumin: 3.4 g/dL — ABNORMAL LOW (ref 3.5–5.0)
Alkaline Phosphatase: 105 U/L (ref 38–126)
Anion gap: 10 (ref 5–15)
BUN: 13 mg/dL (ref 6–20)
CO2: 24 mmol/L (ref 22–32)
Calcium: 8.8 mg/dL — ABNORMAL LOW (ref 8.9–10.3)
Chloride: 103 mmol/L (ref 98–111)
Creatinine, Ser: 1.01 mg/dL (ref 0.61–1.24)
GFR, Estimated: >60 mL/min (ref >60)
Glucose, Bld: 140 mg/dL — ABNORMAL HIGH (ref 70–99)
Potassium: 3.9 mmol/L (ref 3.5–5.1)
Sodium: 137 mmol/L (ref 135–145)
Total Bilirubin: 0.6 mg/dL (ref 0.3–1.2)
Total Protein: 7.7 g/dL (ref 6.5–8.1)

## 2020-08-23 MED ORDER — SILVER NITRATE-POT NITRATE 75-25 % EX MISC
1.0000 | Freq: Once | CUTANEOUS | Status: AC
  Administered 2020-08-23: 1 via TOPICAL
  Filled 2020-08-23: qty 10

## 2020-08-23 NOTE — ED Triage Notes (Signed)
Patient states that he had part of his colon removed 08/14/20. Patient states that he was seen for his follow up appointment yesterday. Patient states that the inscision was infected so the surgeon opened the incision. Patient states that he is suppose to be changing the dressing once a day. Patient states that his wife changed the dressing this morning. Patient states about an hour ago he had bleeding that saturated the dressing. Patient replaced the dressing. The dressing is clean, dry and intact at this time. Patient states that the surgeon started him on antibiotics yesterday.

## 2020-08-23 NOTE — ED Notes (Addendum)
Pt walking to desk reports bleeding resuming, some blood visualized; at bottom of bandage, EDP and charge [employer]

## 2020-08-23 NOTE — ED Notes (Signed)
Pt walked to desk advised on wait - next in line for room

## 2020-08-23 NOTE — ED Provider Notes (Signed)
[facility] Emergency Department Provider Note  ____________________________________________  Time seen: Approximately 11:27 PM  I have reviewed the triage vital signs and the nursing notes.   HISTORY  Chief Complaint Post-op Problem    HPI Derek Ramirez is a 44 y.o. male with a history of GERD, recent partial colectomy due to diverticulosis that was complicated by incisional wound infection, with recent reopening of the wound and drainage of pus in the clinic and then doing wet-to-dry dressings at home, who comes ED complaining of bleeding from the wound tonight.  After having the wound opened, started on Bactrim, surrounding erythema and pain have improved.  Today while changing the wound there was blood spurting out of the inferior edge in a pulsatile fashion.  They repacked the wound and came to the ED for evaluation.  Patient surgeon is Dr. Lady Gary  Past Medical History:  Diagnosis Date  . Asthma    WELL CONTROLLED  . Complication of anesthesia    HARD TO WAKE UP AFTER COLONOSCOPY  . Diverticulitis   . GERD (gastroesophageal reflux disease)   . History of chicken pox   . History of IBS   . History of measles   . Panic disorder   . Sleep apnea    USES CPAP     Patient Active Problem List   Diagnosis Date Noted  . S/P partial colectomy 08/14/2020  . Diverticulitis of large intestine with perforation without bleeding 04/08/2020  . Lumbar radiculopathy 07/05/2018  . Allergic rhinitis 10/31/2015  . Anxiety 10/31/2015  . Chest pain 10/31/2015  . GERD (gastroesophageal reflux disease) 10/31/2015  . Heartburn 10/31/2015  . Palpitations 10/31/2015  . Panic disorder 10/31/2015  . 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 Headache, tension-type 03/01/2008  . Tobacco use 03/17/2006  . 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 BACK SURGERY     LOWER  . COLON RESECTION SIGMOID   08/14/2020   Procedure: SIGMOID COLECTOMY;  Surgeon: Duanne Guess, MD;  Location: [facility] ORS;  Service: General;;  . CYSTOSCOPY WITH BIOPSY  08/14/2020   Procedure: CYSTOSCOPY WITH BIOPSY;  Surgeon: Sondra Come, MD;  Location: [facility] ORS;  Service: Urology;;  . Bluford Kaufmann WITH STENT PLACEMENT Bilateral 08/14/2020   Procedure: CYSTOSCOPY WITH STENT PLACEMENT;  Surgeon: Sondra Come, MD;  Location: [facility] ORS;  Service: Urology;  Laterality: Bilateral;  . LAPAROTOMY N/A 08/14/2020   Procedure: EXPLORATORY LAPAROTOMY WITH MOBILIZATION OF SPLENIC FLEXURE;  Surgeon: Duanne Guess, MD;  Location: [facility] ORS;  Service: General;  Laterality: N/A;  . WISDOM TOOTH EXTRACTION       Prior to Admission medications   Medication Sig Start Date End Date Taking? Authorizing Provider  methocarbamol (ROBAXIN) 500 MG tablet Take 1 tablet (500 mg total) by mouth every 6 (six) hours as needed for muscle spasms. 08/17/20   Carolan Shiver, MD  oxyCODONE (OXY IR/ROXICODONE) 5 MG immediate release tablet Take 1 tablet (5 mg total) by mouth every 4 (four) hours as needed for moderate pain. 08/17/20   Carolan Shiver, MD  sulfamethoxazole-trimethoprim (BACTRIM) 400-80 MG tablet Take 1 tablet by mouth 2 (two) times daily for 10 days. 08/22/20 09/01/20  Donovan Kail, PA-C     Allergies Penicillins and Shellfish allergy   Family History  Problem Relation Age of Onset  . Diabetes Maternal Grandmother   . Diabetes Maternal Grandfather   . Lung cancer Paternal Grandmother  Due to Asbestos  . Lung cancer Paternal Grandfather        due to Asbestos  . Heart Problems Paternal Grandfather     Social History Social History   Tobacco Use  . Smoking status: Former Smoker    Packs/day: 2.00    Years: 16.00    Pack years: 32.00    Types: Cigarettes    Quit date: 10/19/2010    Years since quitting: 9.8  . Smokeless tobacco: Never Used  Vaping Use  . Vaping Use: Never used  Substance  Use Topics  . Alcohol use: No    Alcohol/week: 0.0 standard drinks  . Drug use: No    Review of Systems  Constitutional:   No fever or chills.  ENT:   No sore throat. No rhinorrhea. Cardiovascular:   No chest pain or syncope. Respiratory:   No dyspnea or cough. Gastrointestinal:   Negative for abdominal pain, vomiting and diarrhea.  Musculoskeletal:   Positive for abdominal wall pain and swelling with open wound as above All other systems reviewed and are negative except as documented above in ROS and HPI.  ____________________________________________   PHYSICAL EXAM:  VITAL SIGNS: ED Triage Vitals [08/23/20 2024]  Enc Vitals Group     BP 112/85     Pulse Rate (!) 112     Resp 18     Temp 98.6 F (37 C)     Temp Source Oral     SpO2 98 %     Weight 225 lb (102.1 kg)     Height 5\' 11"  (1.803 m)     Head Circumference      Peak Flow      Pain Score 6     Pain Loc      Pain Edu?      Excl. in GC?     Vital signs reviewed, nursing assessments reviewed.   Constitutional:   Alert and oriented. Non-toxic appearance. Eyes:   Conjunctivae are normal. EOMI. ENT      Head:   Normocephalic and atraumatic.      Mouth/Throat:   MMM      Neck:   No meningismus. Full ROM. Hematological/Lymphatic/Immunilogical:   No cervical lymphadenopathy. Cardiovascular:   RRR, heart rate 90. Symmetric bilateral radial and DP pulses.  No murmurs. Cap refill less than 2 seconds. Respiratory:   Normal respiratory effort without tachypnea/retractions. Breath sounds are clear and equal bilaterally. No wheezes/rales/rhonchi. Gastrointestinal:   Soft and nontender. Non distended. There is no CVA tenderness.  No rebound, rigidity, or guarding. Musculoskeletal:   Normal range of motion in all extremities.  No edema.  There is a large open wound of the midline abdomen with viable beefy tissue without gangrene or eschar.  At the superior edge, the soft tissue probes deeply into 1 small tunnel that  extends to the fascial surface with a drainage of scant amount of pus.  At the inferior edge of the wound, there is clot adherent to the soft tissue and patient and his wife identified this as the location of the bleeding.  No current active bleeding. Neurologic:   Normal speech and language.  Motor grossly intact. No acute focal neurologic deficits are appreciated.  Skin:    Skin is warm, dry with open wound as above. No rash noted.    ____________________________________________    LABS (pertinent positives/negatives) (all labs ordered are listed, but only abnormal results are displayed) Labs Reviewed  CBC - Abnormal; Notable for the following components:  Result Value   WBC 12.8 (*)    Hemoglobin 12.7 (*)    HCT 38.2 (*)    Platelets 431 (*)    All other components within normal limits  COMPREHENSIVE METABOLIC PANEL - Abnormal; Notable for the following components:   Glucose, Bld 140 (*)    Calcium 8.8 (*)    Albumin 3.4 (*)    All other components within normal limits   ____________________________________________   EKG  ____________________________________________    RADIOLOGY  No results found.  ____________________________________________   PROCEDURES Procedures  ____________________________________________  CLINICAL IMPRESSION / ASSESSMENT AND PLAN / ED COURSE  Pertinent labs & imaging results that were available during my care of the patient were reviewed by me and considered in my medical decision making (see chart for details).  Derek Ramirez was evaluated in Emergency Department on 08/23/2020 for the symptoms described in the history of present illness. He was evaluated in the context of the global COVID-19 pandemic, which necessitated consideration that the patient might be at risk for infection with the SARS-CoV-2 virus that causes COVID-19. Institutional protocols and algorithms that pertain to the evaluation of patients at risk for COVID-19 are in a  state of rapid change based on information released by regulatory bodies including the CDC and federal and state organizations. These policies and algorithms were followed during the patient's care in the ED.   Patient presents for bleeding from his surgical wound.  He has been compliant with Bactrim started by surgery clinic and doing wet-to-dry dressings as instructed.  Wound examination today shows that the bleeding has stopped spontaneously.  This was further cauterized with silver nitrate, and repacked in the ED.  He will continue Bactrim and continue following up with surgery.  Infection appears to be improving compared to picture the patient took from the other day, he is not septic, stable for discharge home.      ____________________________________________   FINAL CLINICAL IMPRESSION(S) / ED DIAGNOSES    Final diagnoses:  Infection of superficial incisional surgical site after procedure, subsequent encounter     ED Discharge Orders    None      Portions of this note were generated with dragon dictation software. Dictation errors may occur despite best attempts at proofreading.   Sharman Cheek, MD 08/23/20 2332

## 2020-08-24 NOTE — ED Notes (Signed)
Wound packed with wet to dry dressing using kerlex and sterile water. Pt tolerated wound care well. Wound covered with abdominal pad. Pt spouse educated on appropriate dressing techniques and verbalized understanding.

## 2020-08-26 ENCOUNTER — Ambulatory Visit (INDEPENDENT_AMBULATORY_CARE_PROVIDER_SITE_OTHER): Payer: No Typology Code available for payment source | Admitting: Physician Assistant

## 2020-08-26 ENCOUNTER — Encounter: Payer: Self-pay | Admitting: Physician Assistant

## 2020-08-26 ENCOUNTER — Telehealth: Payer: Self-pay | Admitting: *Deleted

## 2020-08-26 ENCOUNTER — Other Ambulatory Visit: Payer: Self-pay

## 2020-08-26 VITALS — BP 126/83 | HR 96 | Temp 98.2°F | Resp 12 | Ht 71.0 in | Wt 216.0 lb

## 2020-08-26 DIAGNOSIS — Z09 Encounter for follow-up examination after completed treatment for conditions other than malignant neoplasm: Secondary | ICD-10-CM

## 2020-08-26 DIAGNOSIS — Z9049 Acquired absence of other specified parts of digestive tract: Secondary | ICD-10-CM

## 2020-08-26 NOTE — Progress Notes (Signed)
[facility] POST-OP OFFICE VISIT  08/26/2020  HPI: Derek Ramirez is a 44 y.o. male 12 days s/p exploratory laparotomy, mobilization of the splenic flexure,partial colectomy with primary stapled anastomosis for complicated diverticulitis with Dr Lady Gary. His post-operative course has been complicated by wound infection which was re-open and drained on 11/04. He was started on Bactrim at that time. Cx from this grew out MRSA which was susceptible to Bactrim. He was seen in the ED on 11/05 for bleeding from the wound with dressing changes but this had resolved and was discharged home.   Today, he presents to the office with concerns over increased drainage from the wound. They were concerned that the drainage may have been purulent given foul odor. The amount has also reportedly increased as well. No fever, chills or erythema reported. No further bleeding. He has continued to take ABx without missing dose. No additional complaints today.    Vital signs: BP 126/83   Pulse 96   Temp 98.2 F (36.8 C) (Oral)   Resp 12   Ht 5\' 11"  (1.803 m)   Wt 216 lb (98 kg)   SpO2 98%   BMI 30.13 kg/m    Physical Exam: Constitutional: Well appearing male, NAD Abdomen: Abdomen is soft, non-tender, non-distended, no rebound or guarding.  Skin: Midline laparotomy incision is healing via secondary intention, wound bed is about 95% granulation tissue, 5% fibrinous tissue. There is a small <5 mm hole in the granulation tissue just inferior to the umbilicus. This was probed and fascia was found to be intact. I did not appreciate any undrained areas of purulence. The surrounding skin was without erythema.   Assessment/Plan: This is a 44 y.o. male 12 days s/p exploratory laparotomy, mobilization of the splenic flexure,partial colectomy with primary stapled anastomosis for complicated diverticulitis whose post-operative course has been complicated by wound infection, Cx with MRSA, on Bactrim.    - We  will move to BID dressing changes with saline moistened Kerlix, ABD Pad  - Complete Abx as prescribed; reviewed wound Cx  - Pain control prn  - will continue follow up on 11/12 as scheduled   -- 13/12, PA-C [facility] 08/26/2020, 1:37 PM [phone] M-F: 7am - 4pm

## 2020-08-26 NOTE — Patient Instructions (Addendum)
Continue to remove old dressing and replace with new packing and cover with dry dressing Twice daily. Continue to shower daily and allow water to run over the wound. Please keep your appointment this Friday.

## 2020-08-26 NOTE — Telephone Encounter (Signed)
Patient called and stated that his wound where he has been flushing it and changing the dressing daily smells really bad and the drainage has increased in the past 2 days. He has not had fever or chills. Please call and advise

## 2020-08-26 NOTE — Telephone Encounter (Signed)
Patient will come in to be seen by Lynden Oxford PA-C.

## 2020-08-30 ENCOUNTER — Encounter: Payer: Self-pay | Admitting: Physician Assistant

## 2020-08-30 ENCOUNTER — Other Ambulatory Visit: Payer: Self-pay

## 2020-08-30 ENCOUNTER — Ambulatory Visit (INDEPENDENT_AMBULATORY_CARE_PROVIDER_SITE_OTHER): Payer: No Typology Code available for payment source | Admitting: Physician Assistant

## 2020-08-30 VITALS — BP 116/83 | HR 89 | Temp 98.3°F | Ht 71.0 in | Wt 219.0 lb

## 2020-08-30 DIAGNOSIS — K5720 Diverticulitis of large intestine with perforation and abscess without bleeding: Secondary | ICD-10-CM

## 2020-08-30 DIAGNOSIS — Z9049 Acquired absence of other specified parts of digestive tract: Secondary | ICD-10-CM

## 2020-08-30 DIAGNOSIS — Z09 Encounter for follow-up examination after completed treatment for conditions other than malignant neoplasm: Secondary | ICD-10-CM

## 2020-08-30 NOTE — Progress Notes (Signed)
[facility] POST-OP OFFICE VISIT  08/30/2020  HPI: Derek Ramirez is a 44 y.o. male 16 days s/p exploratory laparotomy, mobilization of the splenic flexure,partial colectomy with primary stapled anastomosisfor complicated diverticulitis with Dr Lady Gary. His post-operative course has been complicated by wound infection which was re-open and drained on 11/04  No change since visit earlier this week Doing well No fever, chills No issues with dressing changes  Vital signs: BP 116/83   Pulse 89   Temp 98.3 F (36.8 C)   Ht 5\' 11"  (1.803 m)   Wt 219 lb (99.3 kg)   SpO2 99%   BMI 30.54 kg/m    Physical Exam: Abdomen is soft, non-tender, non-distended, no rebound or guarding.  Skin: Midline laparotomy incision is healing via secondary intention, wound bed is 100% granulation tissue. There is a small <5 mm hole in the granulation tissue just inferior to the umbilicus. This was probed and fascia was found to be intact. I did not appreciate any undrained areas of purulence. The surrounding skin was without erythema.   Assessment/Plan: This is a 44 y.o. male 16 days s/p exploratory laparotomy, mobilization of the splenic flexure,partial colectomy with primary stapled anastomosisfor complicated diverticulitis with Dr 46. His post-operative course has been complicated by wound infection which was re-open and drained on 11/04   - Continue BID dressing changes with saline moistened Kerlix, ABD Pad             - Complete Abx as prescribed; reviewed wound Cx             - Pain control prn             - Follow up on 11/22 for wound check   -- 12/22, PA-C [facility] 08/30/2020, 11:27 AM [phone] M-F: 7am - 4pm

## 2020-09-06 LAB — BODY FLUID CULTURE AER/ANAER

## 2020-09-06 LAB — RESULT

## 2020-09-06 LAB — SPECIMEN STATUS REPORT

## 2020-09-09 ENCOUNTER — Other Ambulatory Visit: Payer: Self-pay

## 2020-09-09 ENCOUNTER — Ambulatory Visit (INDEPENDENT_AMBULATORY_CARE_PROVIDER_SITE_OTHER): Payer: No Typology Code available for payment source | Admitting: Physician Assistant

## 2020-09-09 ENCOUNTER — Encounter: Payer: Self-pay | Admitting: Physician Assistant

## 2020-09-09 VITALS — BP 117/79 | HR 102 | Temp 98.0°F | Ht 71.0 in | Wt 223.0 lb

## 2020-09-09 DIAGNOSIS — Z09 Encounter for follow-up examination after completed treatment for conditions other than malignant neoplasm: Secondary | ICD-10-CM

## 2020-09-09 DIAGNOSIS — Z9049 Acquired absence of other specified parts of digestive tract: Secondary | ICD-10-CM

## 2020-09-09 NOTE — Patient Instructions (Signed)
Continue to pack the area daily. The area will continue to fill in. Call us if the drainage becomes a problem as we may need to use some Silver Nitrate to the wound.  Follow up here in 2 weeks.

## 2020-09-09 NOTE — Progress Notes (Signed)
[facility] POST-OP OFFICE VISIT  09/09/2020  HPI: Derek Ramirez is a 44 y.o. male 26 days s/p exploratory laparotomy, mobilization of the splenic flexure,partial colectomy with primary stapled anastomosisfor complicated diverticulitis with Dr Lady Gary.His post-operative course has been complicated by wound infection which was re-open and drained on 11/04  He is doing well at home with dressing changes No issues No fever, chills, erythema  Vital signs: BP 117/79   Pulse (!) 102   Temp 98 F (36.7 C)   Ht 5\' 11"  (1.803 m)   Wt 223 lb (101.2 kg)   SpO2 98%   BMI 31.10 kg/m    Physical Exam: Constitutional: Well appearing male, NAD Abdomen: Soft, non-tender, non-distended, no rebound/guarding Skin: Laparotomy incision is healing via secondary intention, wound bed is 100% granulation tissue, the depth of this has significantly decreased in size, no surrounding erythema or drainage   Assessment/Plan: This is a 44 y.o. male 26 days s/p exploratory laparotomy, mobilization of the splenic flexure,partial colectomy with primary stapled anastomosisfor complicated diverticulitis with Dr 45.His post-operative course has been complicated by wound infection which was re-open and drained on 11/04   - Continue daily dressing changes with saline moistened Kerlix, ABD Pad  - Pain control prn  - rtc in 2 weeks for wound re-check   -- 13/04, PA-C [name] 09/09/2020, 11:34 AM [phone] M-F: 7am - 4pm

## 2020-09-17 ENCOUNTER — Telehealth: Payer: Self-pay | Admitting: General Surgery

## 2020-09-17 NOTE — Telephone Encounter (Signed)
Patient is calling and is asking if one of the nurses could give him a call back, has some concern about a spot on his stomach. Please call patient and advise.

## 2020-09-17 NOTE — Telephone Encounter (Signed)
Patient states that the edges around his wound have a purple color. He reports no drainage, soreness, or foul smell. Patient assured that there would be some discoloration as the new skin grew in. He is aware to call back for any further concerns. He will follow up on Monday.

## 2020-09-23 ENCOUNTER — Ambulatory Visit (INDEPENDENT_AMBULATORY_CARE_PROVIDER_SITE_OTHER): Payer: No Typology Code available for payment source | Admitting: Physician Assistant

## 2020-09-23 ENCOUNTER — Other Ambulatory Visit: Payer: Self-pay

## 2020-09-23 ENCOUNTER — Encounter: Payer: Self-pay | Admitting: Physician Assistant

## 2020-09-23 VITALS — BP 115/80 | HR 69 | Temp 97.9°F | Ht 70.0 in | Wt 226.0 lb

## 2020-09-23 DIAGNOSIS — Z09 Encounter for follow-up examination after completed treatment for conditions other than malignant neoplasm: Secondary | ICD-10-CM

## 2020-09-23 DIAGNOSIS — Z9049 Acquired absence of other specified parts of digestive tract: Secondary | ICD-10-CM

## 2020-09-23 NOTE — Patient Instructions (Signed)
Continue daily dressing changes. May use lighter/less wet to dry gauze and a lighter top dressing. Follow up in 2 weeks.

## 2020-09-23 NOTE — Progress Notes (Signed)
[facility] POST-OP OFFICE VISIT  09/23/2020  HPI: Derek Ramirez is a 44 y.o. male 40 days s/p exploratory laparotomy, mobilization of the splenic flexure,partial colectomy with primary stapled anastomosisfor complicated diverticulitis with Dr Lady Gary.His post-operative course has been complicated by wound infection which was re-open and drained on 11/04  I am following him biweekly for wound checks He is doing well, no complaints He is using less and less packing and the wound is starting to close No issues  Vital signs: BP 115/80   Pulse 69   Temp 97.9 F (36.6 C)   Ht 5\' 10"  (1.778 m)   Wt 226 lb (102.5 kg)   SpO2 98%   BMI 32.43 kg/m    Physical Exam: Constitutional: Well appearing male, NAD Abdomen: Soft, non-tender, non-distended, no rebound/guarding Skin: Laparotomy incision is healing via secondary intention, wound bed is 100% granulation tissue, the superior portion of the wound has almost completely closed, at the widest this is about 2cm with a depth of <5 mm, no surrounding erythema or drainage   Assessment/Plan: This is a 44 y.o. male 40 days s/p exploratory laparotomy, mobilization of the splenic flexure,partial colectomy with primary stapled anastomosisfor complicated diverticulitis with Dr 45.His post-operative course has been complicated by wound infection which was re-open and drained on 11/04   - Continue wet to dry dressings daily, I believe in the next 1-2 weeks he will be able to transition to superficial dressings only.   - I will see him again in 2 weeks for wound check   -- 13/04, PA-C [facility] 09/23/2020, 10:46 AM [phone] M-F: 7am - 4pm

## 2020-09-28 IMAGING — CT CT ABD-PELV W/ CM
2 of 5 series · 15 of 46 positions shown, 17 images · IV contrast (omnipaque)
Comparison: Most recent comparison 04/30/2020

CLINICAL DATA: Diverticulitis suspected. Multiple episodes of
diverticulitis. Preop.

EXAM:
CT ABDOMEN AND PELVIS WITH CONTRAST
TECHNIQUE: Multidetector CT imaging of the abdomen and pelvis was performed
using the standard protocol following bolus administration of
intravenous contrast.
CONTRAST:  100mL OMNIPAQUE IOHEXOL 300 MG/ML  SOLN

[Series 2: abd pelvis 5.00 · axial · 0.74mm/px · z∈[-1546,-1081]mm · 12 of 105 slices shown, 14 images]
[im 6/105  soft-tissue]
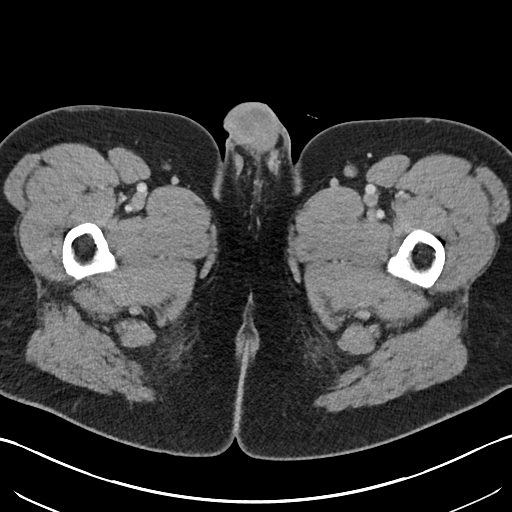
[im 6/105  bone]
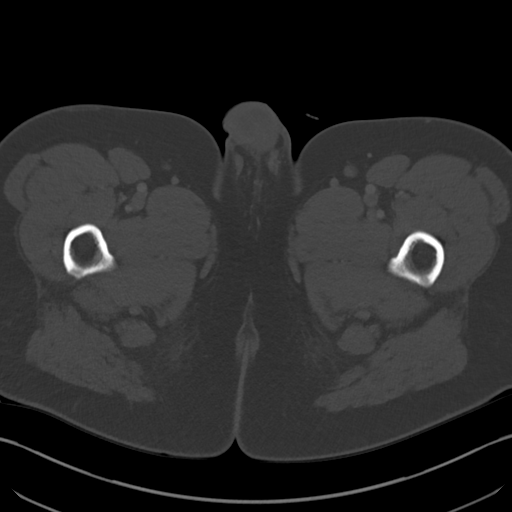
[im 17/105  soft-tissue]
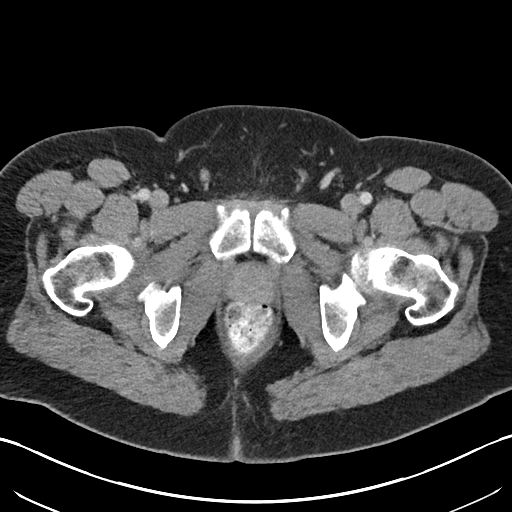
[im 22/105  soft-tissue]
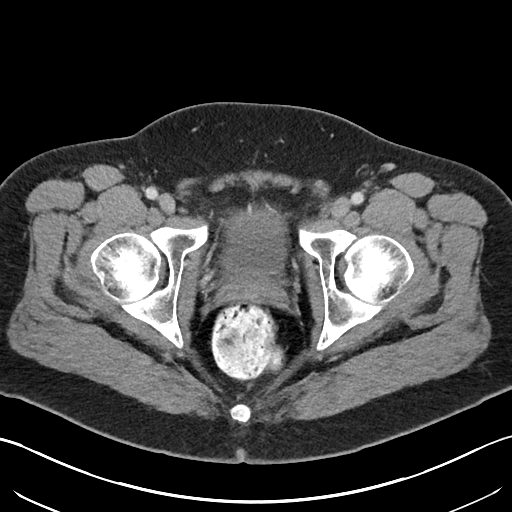
[im 33/105  soft-tissue]
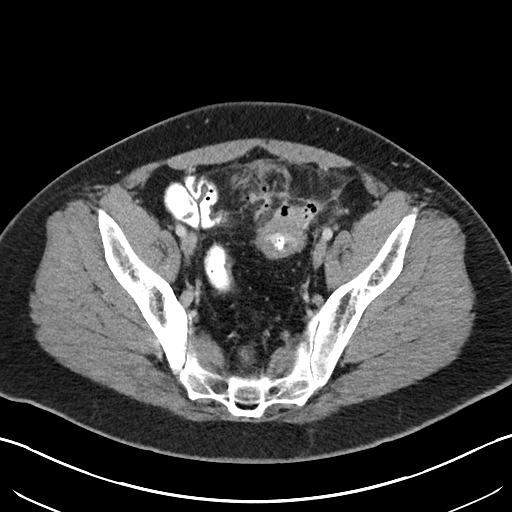
[im 39/105  soft-tissue]
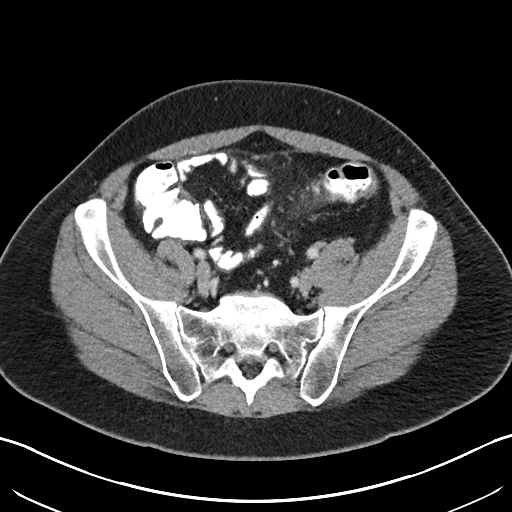
[im 50/105  soft-tissue]
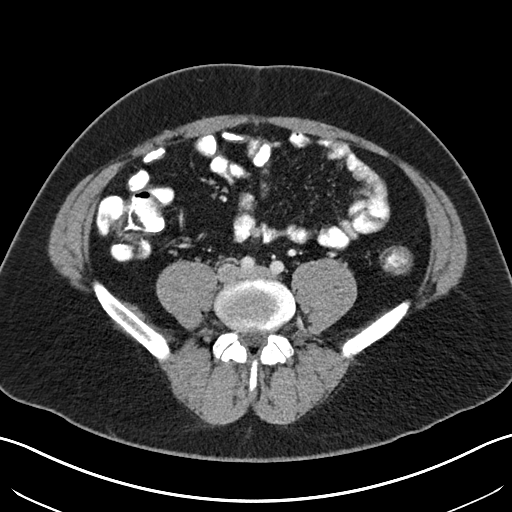
[im 55/105  soft-tissue]
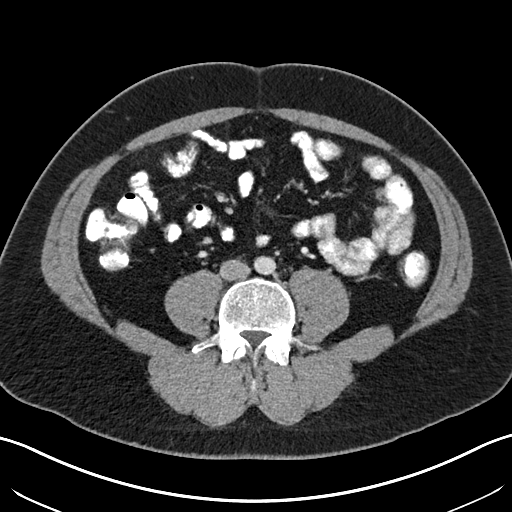
[im 66/105  soft-tissue]
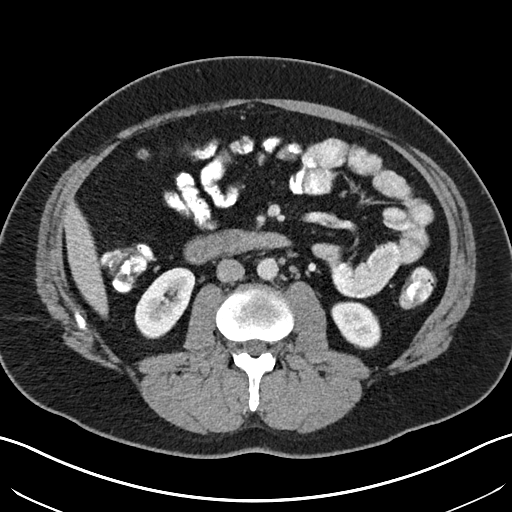
[im 72/105  soft-tissue]
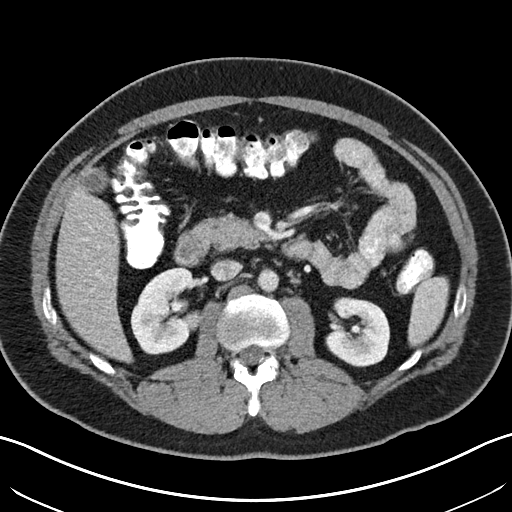
[im 72/105  bone]
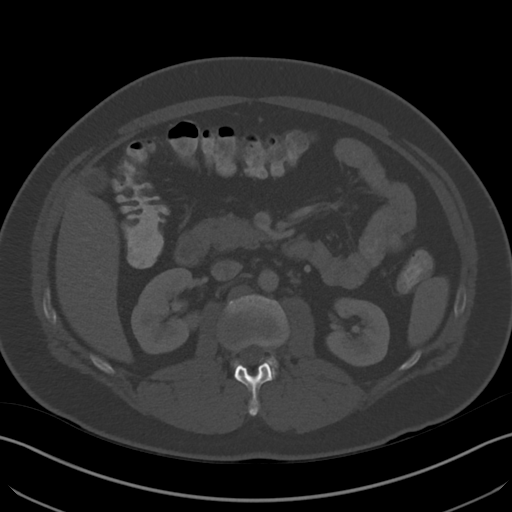
[im 83/105  soft-tissue]
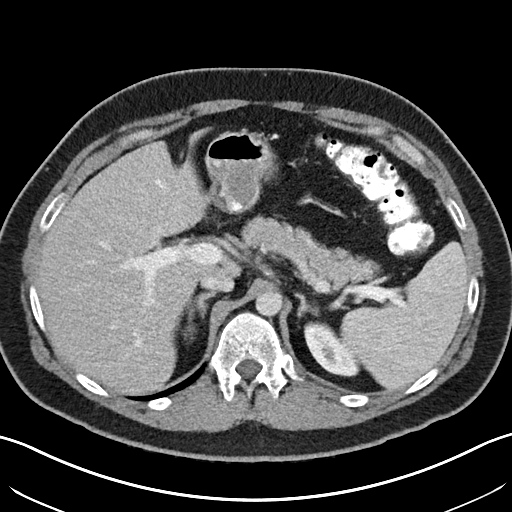
[im 88/105  soft-tissue]
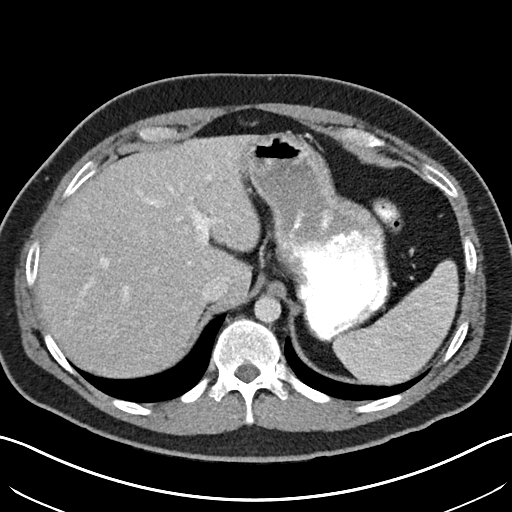
[im 99/105  soft-tissue]
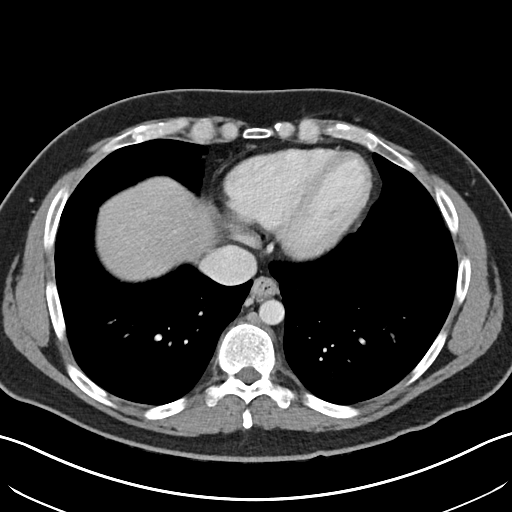

[Series 4: coronals abd pelvis 2.00 cor · coronal · 0.74mm/px · 3 of 150 slices shown]
[im 50/150  soft-tissue]
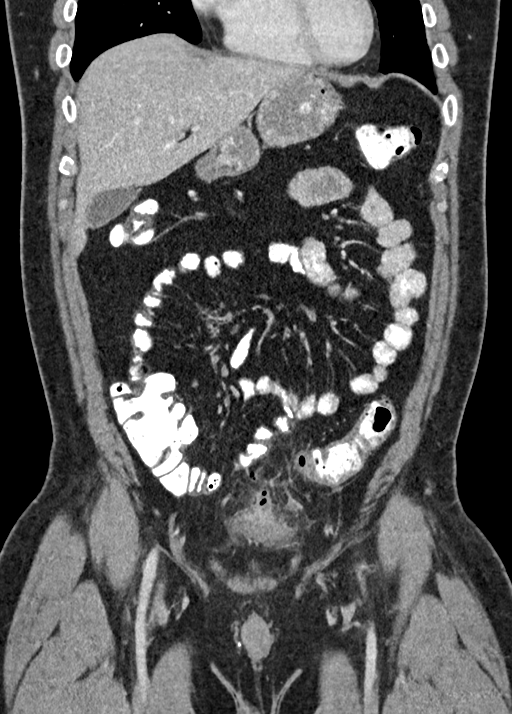
[im 67/150  soft-tissue]
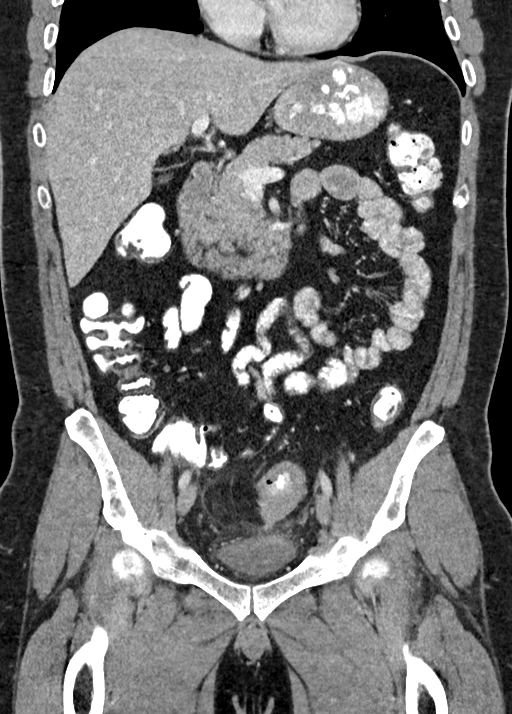
[im 83/150  soft-tissue]
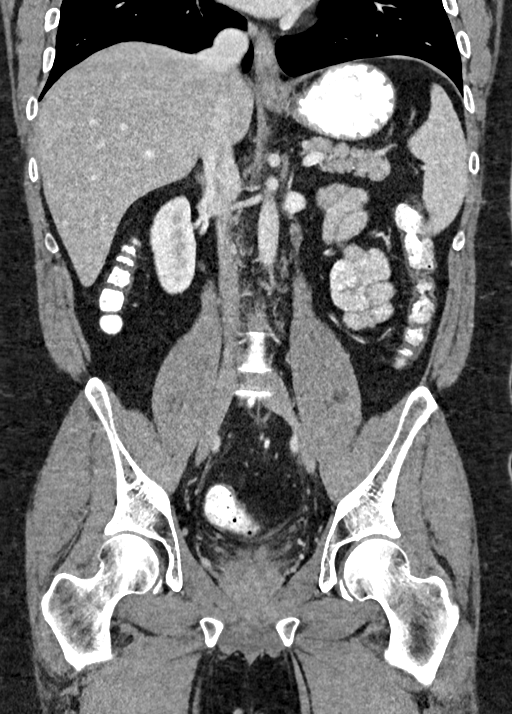

[15 of 46 positions shown; findings below may reference images not displayed]

FINDINGS: Lower chest: No acute findings. No focal airspace disease or pleural
fluid. The heart is normal in size.

Hepatobiliary: No focal hepatic abnormality. Gallbladder is
unremarkable. No calcified gallstone. No biliary dilatation.

Pancreas: No ductal dilatation or inflammation.

Spleen: Upper normal in size without focal abnormality.

Adrenals/Urinary Tract: Normal adrenal glands. No hydronephrosis or
perinephric edema. Homogeneous renal enhancement with symmetric
excretion on delayed phase imaging. Nondistended urinary bladder.
Pericolonic inflammatory changes extend to the left aspect of the
urinary bladder. Bladder wall thickening circumferential but more
asymmetric on the left. There is no air within the bladder lumen.

Stomach/Bowel: Persistent circumferential wall thickening involving
the mid sigmoid colon with pericolonic fat stranding. Foci of
extraluminal air adjacent to the sigmoid colon persist but have
improved in the interim. Extraluminal gas tracks to the left aspect
of the urinary bladder, series 4, image 56. The degree of
inflammatory stranding is slightly improved. There is no evidence of
drainable or focal fluid collection.

Administered enteric contrast reaches the rectum, there is no
evidence of extravasation of enteric contrast. Ascending,
transverse, and descending colon are unremarkable. Stomach and small
bowel are normal. The terminal ileum is normal. Air-filled appendix
courses centrally without appendicitis.

Vascular/Lymphatic: Normal caliber abdominal aorta. Patent portal
vein. No evidence of mesenteric gas or venous thrombus. Multiple
small retroperitoneal nodes are not enlarged by size criteria.

Reproductive: Prostate is unremarkable.

Other: Localized pericolonic gas persists but improved from prior
exam. No frank pneumoperitoneum. Inflammatory changes in the pelvis
without significant ascites.

Musculoskeletal: There are no acute or suspicious osseous
abnormalities.
IMPRESSION: 1. Persistent circumferential wall thickening involving the
mid-distal sigmoid colon with pericolonic fat stranding. Foci of
extraluminal air adjacent to the sigmoid colon persist but have
improved in the interim. Differential considerations include
perforated diverticulitis or colonic neoplasm.
2. Extraluminal gas and inflammatory change tracks to the left
aspect of the urinary bladder. Diffuse bladder wall thickening and
perivesicular fat stranding. While the air in the bladder lumen has
resolved, findings remain suspicious for colovesicular fistula.
3. No drainable abscess.

## 2020-10-07 ENCOUNTER — Ambulatory Visit (INDEPENDENT_AMBULATORY_CARE_PROVIDER_SITE_OTHER): Payer: No Typology Code available for payment source | Admitting: Physician Assistant

## 2020-10-07 ENCOUNTER — Encounter: Payer: Self-pay | Admitting: Physician Assistant

## 2020-10-07 ENCOUNTER — Other Ambulatory Visit: Payer: Self-pay

## 2020-10-07 VITALS — BP 111/76 | HR 87 | Temp 97.7°F | Ht 70.0 in | Wt 231.6 lb

## 2020-10-07 DIAGNOSIS — Z09 Encounter for follow-up examination after completed treatment for conditions other than malignant neoplasm: Secondary | ICD-10-CM

## 2020-10-07 DIAGNOSIS — Z9049 Acquired absence of other specified parts of digestive tract: Secondary | ICD-10-CM

## 2020-10-07 NOTE — Progress Notes (Signed)
[facility] POST-OP OFFICE VISIT  10/07/2020  HPI: Derek Ramirez is a 44 y.o. male ~2 months s/p exploratory laparotomy, mobilization of the splenic flexure,partial colectomy with primary stapled anastomosisfor complicated diverticulitis with Dr Lady Gary.His post-operative course has been complicated by wound infection which was re-open and drained on 11/04  Doing well Wound is about 85% closed now No fever, chills  Vital signs: BP 111/76   Pulse 87   Temp 97.7 F (36.5 C) (Oral)   Ht 5\' 10"  (1.778 m)   Wt 231 lb 9.6 oz (105.1 kg)   SpO2 98%   BMI 33.23 kg/m    Physical Exam: Constitutional: Well appearing male, NAD Abdomen:Soft, non-tender, non-distended, no rebound/guarding Skin:Laparotomy incision is healing via secondary intention, wound is about 85% closed, wound bed is 100% granulation, minimal drainage  Assessment/Plan: This is a 44 y.o. male ~2 months s/p exploratory laparotomy, mobilization of the splenic flexure,partial colectomy with primary stapled anastomosisfor complicated diverticulitis with Dr 45.His post-operative course has been complicated by wound infection which was re-open and drained on 11/04   - Continue superficial dressings daily  - I applied silver nitrate to hypertrophic granulation tissue to help control drainage  - At this point, he will follow up on an as needed basis. He understands to call with questions/concerns  -- 13/04, PA-C [facility] 10/07/2020, 10:35 AM [phone] M-F: 7am - 4pm

## 2020-10-07 NOTE — Patient Instructions (Signed)
Please call if you have any questions or concerns.  °

## 2020-11-01 ENCOUNTER — Other Ambulatory Visit: Payer: No Typology Code available for payment source

## 2021-03-28 ENCOUNTER — Encounter: Payer: Self-pay | Admitting: Physician Assistant

## 2021-07-16 ENCOUNTER — Encounter: Payer: Self-pay | Admitting: General Surgery

## 2021-08-20 ENCOUNTER — Other Ambulatory Visit: Payer: Self-pay | Admitting: Family Medicine

## 2021-08-20 DIAGNOSIS — K219 Gastro-esophageal reflux disease without esophagitis: Secondary | ICD-10-CM

## 2021-08-20 MED ORDER — PANTOPRAZOLE SODIUM 20 MG PO TBEC: 20.0000 mg | DELAYED_RELEASE_TABLET | Freq: Every day | ORAL | 3 refills | Status: DC

## 2021-10-21 ENCOUNTER — Telehealth (INDEPENDENT_AMBULATORY_CARE_PROVIDER_SITE_OTHER): Payer: No Typology Code available for payment source | Admitting: Physician Assistant

## 2021-10-21 ENCOUNTER — Encounter: Payer: Self-pay | Admitting: Physician Assistant

## 2021-10-21 DIAGNOSIS — F411 Generalized anxiety disorder: Secondary | ICD-10-CM | POA: Diagnosis not present

## 2021-10-21 MED ORDER — SERTRALINE HCL 50 MG PO TABS: 50.0000 mg | ORAL_TABLET | Freq: Every day | ORAL | 1 refills | Status: DC

## 2021-10-21 NOTE — Progress Notes (Addendum)
Established patient visit   Patient: Derek Ramirez   DOB: 10/29/1975   46 y.o. Male  MRN: [id number] Visit Date: 10/21/2021  Today's healthcare provider: Alfredia Ferguson, PA-C   Virtual Visit via Video Note  I connected with Derek Ramirez on 10/21/21 at 10:40 AM EST by a video enabled telemedicine application and verified that I am speaking with the correct person using two identifiers.  Location: Patient: home Provider: [facility]   I discussed the limitations of evaluation and management by telemedicine and the availability of in person appointments. The patient expressed understanding and agreed to proceed.   Chief Complaint  Patient presents with   Medication Refill   Anxiety   Follow-up   Subjective    HPI  Anxiety, Follow-up  He was last seen for anxiety 1 years ago. Changes made at last visit include continue zoloft.   He reports excellent compliance with treatment. Notices a difference in his mood if he forgets pills for a few days.  He reports good tolerance of treatment. He is not having side effects.   He feels his anxiety is mild and Improved since last visit.  Symptoms: No chest pain No difficulty concentrating  No dizziness No fatigue  No feelings of losing control No insomnia  No irritable No palpitations  No panic attacks No racing thoughts  No shortness of breath No sweating  No tremors/shakes    GAD-7 Results GAD-7 Generalized Anxiety Disorder Screening Tool 10/21/2021  1. Feeling Nervous, Anxious, or on Edge 0  2. Not Being Able to Stop or Control Worrying 0  3. Worrying Too Much About Different Things 0  4. Trouble Relaxing 0  5. Being So Restless it's Hard To Sit Still 0  6. Becoming Easily Annoyed or Irritable 0  7. Feeling Afraid As If Something Awful Might Happen 0  Total GAD-7 Score 0  Difficulty At Work, Home, or Getting  Along With Others? Not difficult at all    PHQ-9 Scores PHQ9 SCORE ONLY 03/27/2020  PHQ-9 Total Score 0      Medications: Outpatient Medications Prior to Visit  Medication Sig   pantoprazole (PROTONIX) 20 MG tablet Take 1 tablet (20 mg total) by mouth daily.   [DISCONTINUED] sertraline (ZOLOFT) 25 MG tablet Take 25 mg by mouth daily.   No facility-administered medications prior to visit.    Review of Systems  Constitutional:  Negative for fatigue.  Respiratory:  Negative for shortness of breath.   Cardiovascular:  Negative for chest pain.  Psychiatric/Behavioral:  The patient is nervous/anxious.      Objective      Physical Exam Constitutional:      General: He is not in acute distress.    Appearance: Normal appearance. He is obese.  Neurological:     Mental Status: He is oriented to person, place, and time.  Psychiatric:        Mood and Affect: Mood normal.        Behavior: Behavior normal.      No results found for any visits on 10/21/21.  Assessment & Plan     Problem List Items Addressed This Visit       Other   Anxiety - Primary    Stable and chronic Continue zoloft Encouraged next visit in 4 mo in office      Relevant Medications   sertraline (ZOLOFT) 50 MG tablet      I discussed the assessment and treatment plan with the patient. The patient  was provided an opportunity to ask questions and all were answered. The patient agreed with the plan and demonstrated an understanding of the instructions.   The patient was advised to call back or seek an in-person evaluation if the symptoms worsen or if the condition fails to improve as anticipated.  I provided 15 minutes of non-face-to-face time during this encounter.    Return in about 4 months (around 02/18/2022) for CPE, anxiety.      I, Alfredia Ferguson, PA-C have reviewed all documentation for this visit. The documentation on  10/21/2021 for the exam, diagnosis, procedures, and orders are all accurate and complete.    Alfredia Ferguson, PA-C  [facility] [phone] (phone) [phone]  (fax)  [facility]

## 2021-10-21 NOTE — Assessment & Plan Note (Signed)
Stable and chronic Continue zoloft Encouraged next visit in 4 mo in office

## 2021-11-22 ENCOUNTER — Emergency Department
Admission: EM | Admit: 2021-11-22 | Discharge: 2021-11-22 | Disposition: A | Discharge status: Home / Self Care | Payer: No Typology Code available for payment source | Attending: Emergency Medicine | Admitting: Emergency Medicine

## 2021-11-22 ENCOUNTER — Emergency Department: Payer: No Typology Code available for payment source

## 2021-11-22 DIAGNOSIS — J45909 Unspecified asthma, uncomplicated: Secondary | ICD-10-CM | POA: Insufficient documentation

## 2021-11-22 DIAGNOSIS — N200 Calculus of kidney: Secondary | ICD-10-CM | POA: Diagnosis not present

## 2021-11-22 DIAGNOSIS — R109 Unspecified abdominal pain: Secondary | ICD-10-CM | POA: Diagnosis present

## 2021-11-22 DIAGNOSIS — N23 Unspecified renal colic: Secondary | ICD-10-CM

## 2021-11-22 LAB — URINALYSIS, ROUTINE W REFLEX MICROSCOPIC
Bacteria, UA: NONE SEEN
Bilirubin Urine: NEGATIVE
Glucose, UA: NEGATIVE mg/dL
Hgb urine dipstick: NEGATIVE
Ketones, ur: NEGATIVE mg/dL
Leukocytes,Ua: NEGATIVE
Nitrite: NEGATIVE
Protein, ur: NEGATIVE mg/dL
Specific Gravity, Urine: >1.03 — ABNORMAL HIGH (ref 1.005–1.030)
pH: 5 (ref 5.0–8.0)

## 2021-11-22 LAB — CBC
HCT: 42.1 % (ref 39.0–52.0)
Hemoglobin: 14 g/dL (ref 13.0–17.0)
MCH: 28.3 pg (ref 26.0–34.0)
MCHC: 33.3 g/dL (ref 30.0–36.0)
MCV: 85.2 fL (ref 80.0–100.0)
Platelets: 246 10*3/uL (ref 150–400)
RBC: 4.94 MIL/uL (ref 4.22–5.81)
RDW: 12.5 % (ref 11.5–15.5)
WBC: 7.3 10*3/uL (ref 4.0–10.5)
nRBC: 0 % (ref 0.0–0.2)

## 2021-11-22 LAB — COMPREHENSIVE METABOLIC PANEL
ALT: 25 U/L (ref 0–44)
AST: 20 U/L (ref 15–41)
Albumin: 3.8 g/dL (ref 3.5–5.0)
Alkaline Phosphatase: 73 U/L (ref 38–126)
Anion gap: 6 (ref 5–15)
BUN: 17 mg/dL (ref 6–20)
CO2: 24 mmol/L (ref 22–32)
Calcium: 9 mg/dL (ref 8.9–10.3)
Chloride: 106 mmol/L (ref 98–111)
Creatinine, Ser: 1.08 mg/dL (ref 0.61–1.24)
GFR, Estimated: >60 mL/min (ref >60)
Glucose, Bld: 116 mg/dL — ABNORMAL HIGH (ref 70–99)
Potassium: 4 mmol/L (ref 3.5–5.1)
Sodium: 136 mmol/L (ref 135–145)
Total Bilirubin: 0.5 mg/dL (ref 0.3–1.2)
Total Protein: 7.3 g/dL (ref 6.5–8.1)

## 2021-11-22 MED ORDER — OXYCODONE-ACETAMINOPHEN 5-325 MG PO TABS
1.0000 | ORAL_TABLET | ORAL | Status: DC | PRN
  Administered 2021-11-22: 1 via ORAL
  Filled 2021-11-22: qty 1

## 2021-11-22 MED ORDER — ONDANSETRON 4 MG PO TBDP: 4.0000 mg | ORAL_TABLET | Freq: Three times a day (TID) | ORAL | 0 refills | Status: DC | PRN

## 2021-11-22 MED ORDER — IBUPROFEN 800 MG PO TABS: 800.0000 mg | ORAL_TABLET | Freq: Three times a day (TID) | ORAL | 0 refills | Status: DC | PRN

## 2021-11-22 MED ORDER — ONDANSETRON 4 MG PO TBDP
4.0000 mg | ORAL_TABLET | Freq: Once | ORAL | Status: AC | PRN
  Administered 2021-11-22: 4 mg via ORAL
  Filled 2021-11-22: qty 1

## 2021-11-22 MED ORDER — TAMSULOSIN HCL 0.4 MG PO CAPS: 0.4000 mg | ORAL_CAPSULE | Freq: Every day | ORAL | 0 refills | Status: AC

## 2021-11-22 NOTE — ED Triage Notes (Signed)
Pt presents tonight with left side flank pain that started 2 hours ago. He notes taking 1000mg  of Tylenol & 10mg  of Flexeril and has not had any improvement in sx. Denies hx of Kidney stones or urinary sx.

## 2021-11-22 NOTE — ED Notes (Signed)
Pt updated

## 2021-11-22 NOTE — Discharge Instructions (Addendum)
You have been seen in the Emergency Department (ED)  Today and was diagnosed with kidney stones. While the stone is traveling through the ureter, which is the tube that carries urine from the kidney to the bladder, you will probably feel pain. The pain may be mild or very severe. You may also have some blood in your urine. As soon as the stone reaches the bladder, any intense pain should go away. If a stone is too large to pass on its own, you may need a medical procedure to help you pass the stone.   As we have discussed, please drink plenty of fluids and use a urinary strainer to attempt to capture the stone.  Please make a follow up appointment with Urology in the next week by calling the number below and bring the stone with you.  Pain control: Take ibuprofen 800mg every 6 hours for the pain. Please also take your prescribed flomax daily.   Follow-up with your doctor or return to the ER in 12-24 hours if your pain is not well controlled, if you develop pain or burning with urination, or if you develop a fever. Otherwise follow up in 3-5 days with your doctor.  When should you call for help?  Call your doctor now or seek immediate medical care if:  You cannot keep down fluids.  Your pain gets worse.  You have a fever or chills.  You have new or worse pain in your back just below your rib cage (the flank area).  You have new or more blood in your urine. You have pain or burning with urination You are unable to urinate You have abdominal pain  Watch closely for changes in your health, and be sure to contact your doctor if:  You do not get better as expected  How can you care for yourself at home?  Drink plenty of fluids, enough so that your urine is light yellow or clear like water. If you have kidney, heart, or liver disease and have to limit fluids, talk with your doctor before you increase the amount of fluids you drink.  Take pain medicines exactly as directed. Call your doctor if you  think you are having a problem with your medicine.  If the doctor gave you a prescription medicine for pain, take it as prescribed.  If you are not taking a prescription pain medicine, ask your doctor if you can take an over-the-counter medicine. Read and follow all instructions on the label. Your doctor may ask you to strain your urine so that you can collect your kidney stone when it passes. You can use a kitchen strainer or a tea strainer to catch the stone. Store it in a plastic bag until you see your doctor again.  Preventing future kidney stones  Some changes in your diet may help prevent kidney stones. Depending on the cause of your stones, your doctor may recommend that you:  Drink plenty of fluids, enough so that your urine is light yellow or clear like water. If you have kidney, heart, or liver disease and have to limit fluids, talk with your doctor before you increase the amount of fluids you drink.  Limit coffee, tea, and alcohol. Also avoid grapefruit juice.  Do not take more than the recommended daily dose of vitamins C and D.  Avoid antacids such as Gaviscon, Maalox, Mylanta, or Tums.  Limit the amount of salt (sodium) in your diet.  Eat a balanced diet that is not too high   in protein.  Limit foods that are high in a substance called oxalate, which can cause kidney stones. These foods include dark green vegetables, rhubarb, chocolate, wheat bran, nuts, cranberries, and beans.    

## 2021-11-22 NOTE — ED Provider Notes (Signed)
[facility] Provider Note    Event Date/Time   First MD Initiated Contact with Patient 11/22/21 [phone]     (approximate)   History   Flank Pain   HPI  Derek Ramirez is a 46 y.o. male who presents for evaluation of left-sided flank pain.  Patient reports that pain woke him up from his sleep 2 hours before arriving.  The pain was severe, sharp, located in the left flank, radiating across to the lower abdomen.  No dysuria or hematuria, had some nausea but no vomiting.  No fever or chills.  He took 1009 g of Tylenol and 10 mg of Flexeril with no improvement of the symptoms.  Denies any prior history of kidney stones.     Past Medical History:  Diagnosis Date   Asthma    WELL CONTROLLED   Complication of anesthesia    HARD TO WAKE UP AFTER COLONOSCOPY   Diverticulitis    GERD (gastroesophageal reflux disease)    History of chicken pox    History of IBS    History of measles    Panic disorder    Sleep apnea    USES CPAP    Past Surgical History:  Procedure Laterality Date   BACK SURGERY     LOWER   COLON RESECTION SIGMOID  08/14/2020   Procedure: SIGMOID COLECTOMY;  Surgeon: Fredirick Maudlin, MD;  Location: [facility];  Service: General;;   CYSTOSCOPY WITH BIOPSY  08/14/2020   Procedure: CYSTOSCOPY WITH BIOPSY;  Surgeon: Billey Co, MD;  Location: [facility];  Service: Urology;;   CYSTOSCOPY WITH STENT PLACEMENT Bilateral 08/14/2020   Procedure: CYSTOSCOPY WITH STENT PLACEMENT;  Surgeon: Billey Co, MD;  Location: [facility];  Service: Urology;  Laterality: Bilateral;   LAPAROTOMY N/A 08/14/2020   Procedure: EXPLORATORY LAPAROTOMY WITH MOBILIZATION OF SPLENIC FLEXURE;  Surgeon: Fredirick Maudlin, MD;  Location: [facility];  Service: General;  Laterality: N/A;   WISDOM TOOTH EXTRACTION       Physical Exam   Triage Vital Signs: ED Triage Vitals [11/22/21 0248]  Enc Vitals Group     BP (!) 140/93     Pulse Rate 66     Resp 18     Temp 98.3 F  (36.8 C)     Temp Source Oral     SpO2 98 %     Weight 250 lb (113.4 kg)     Height 5\' 11"  (1.803 m)     Head Circumference      Peak Flow      Pain Score 8     Pain Loc      Pain Edu?      Excl. in [place]?     Most recent vital signs: Vitals:   11/22/21 0248 11/22/21 0613  BP: (!) 140/93 129/86  Pulse: 66 63  Resp: 18 16  Temp: 98.3 F (36.8 C)   SpO2: 98% 96%     Constitutional: Alert and oriented. Well appearing and in no apparent distress. HEENT:      Head: Normocephalic and atraumatic.         Eyes: Conjunctivae are normal. Sclera is non-icteric.       Mouth/Throat: Mucous membranes are moist.       Neck: Supple with no signs of meningismus. Cardiovascular: Regular rate and rhythm. No murmurs, gallops, or rubs. 2+ symmetrical distal pulses are present in all extremities.  Respiratory: Normal respiratory effort. Lungs are clear to auscultation bilaterally.  Gastrointestinal: Soft,  non tender, and non distended with positive bowel sounds. No rebound or guarding. Genitourinary: No CVA tenderness. Musculoskeletal:  No edema, cyanosis, or erythema of extremities. Neurologic: Normal speech and language. Face is symmetric. Moving all extremities. No gross focal neurologic deficits are appreciated. Skin: Skin is warm, dry and intact. No rash noted. Psychiatric: Mood and affect are normal. Speech and behavior are normal.  ED Results / Procedures / Treatments   Labs (all labs ordered are listed, but only abnormal results are displayed) Labs Reviewed  COMPREHENSIVE METABOLIC PANEL - Abnormal; Notable for the following components:      Result Value   Glucose, Bld 116 (*)    All other components within normal limits  URINALYSIS, ROUTINE W REFLEX MICROSCOPIC - Abnormal; Notable for the following components:   Color, Urine YELLOW (*)    APPearance CLEAR (*)    Specific Gravity, Urine >1.030 (*)    All other components within normal limits  CBC      EKG  none   RADIOLOGY I, Rudene Re, attending MD, have personally viewed and interpreted the images obtained during this visit as below:  CT showing left-sided renal stone   ___________________________________________________ Interpretation by Radiologist:  CT Renal Stone Study  Result Date: 11/22/2021 CLINICAL DATA:  Left flank pain EXAM: CT ABDOMEN AND PELVIS WITHOUT CONTRAST TECHNIQUE: Multidetector CT imaging of the abdomen and pelvis was performed following the standard protocol without IV contrast. RADIATION DOSE REDUCTION: This exam was performed according to the departmental dose-optimization program which includes automated exposure control, adjustment of the mA and/or kV according to patient size and/or use of iterative reconstruction technique. COMPARISON:  07/08/2020 FINDINGS: Lower chest: No acute abnormality Hepatobiliary: No focal hepatic abnormality. Gallbladder unremarkable. Pancreas: No focal abnormality or ductal dilatation. Spleen: No focal abnormality.  Normal size. Adrenals/Urinary Tract: Punctate 1 mm left lower pole renal stone. No ureteral stones or hydronephrosis. Urinary bladder decompressed. Stomach/Bowel: Postoperative changes in the sigmoid colon. Normal appendix. Stomach, large and small bowel grossly unremarkable. Vascular/Lymphatic: No evidence of aneurysm or adenopathy. Reproductive: No visible focal abnormality. Other: No free fluid or free air. Small midline supraumbilical ventral hernias containing fat. Musculoskeletal: No acute bony abnormality. IMPRESSION: Punctate 1 mm left lower pole nephrolithiasis. No ureteral stones or hydronephrosis. No acute findings. Electronically Signed   By: Rolm Baptise M.D.   On: 11/22/2021 03:20      PROCEDURES:  Critical Care performed: No  Procedures    IMPRESSION / MDM / ASSESSMENT AND PLAN / ED COURSE  I reviewed the triage vital signs and the nursing notes.  46 y.o. male who presents for evaluation  of left-sided flank pain.  Patient with sudden onset of severe left-sided flank pain that started a couple hours prior to arrival.  During my evaluation after being 3.5 hours in the waiting room patient's pain is fully resolved.  He has no CVA tenderness, no abdominal tenderness.  Exam is otherwise benign.  Ddx: Kidney stone versus pyelonephritis versus shingles.  Doubt pneumonia with no cough or shortness of breath.  Doubt PE with no tachypnea, tachycardia or hypoxia.   Plan: CT renal, urinalysis, CBC, CMP.  Patient given p.o. Zofran and 1 Percocet for pain   MEDICATIONS GIVEN IN ED: Medications  oxyCODONE-acetaminophen (PERCOCET/ROXICET) 5-325 MG per tablet 1 tablet (1 tablet Oral Given 11/22/21 0252)  ondansetron (ZOFRAN-ODT) disintegrating tablet 4 mg (4 mg Oral Given 11/22/21 0252)     ED COURSE: CT showing a 1 mm left renal stone with no  ureteral stone.  Most likely etiology is a passed stone at this time since patient's pain is fully resolved.  UA with no signs of urinary tract infection.  No signs of sepsis, normal white count, no significant electrolyte derangements.  Discussed increase oral hydration and dietary changes to prevent future kidney stones.  We will provide patient with prescription for ibuprofen 800 mg, Flomax, and Zofran in case pain/symptoms return.  Otherwise follow-up with primary care doctor.  Admission was considered but felt unnecessary since patient's symptoms are fully resolved and his work-up is otherwise reassuring.   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   Please note:  Patient was evaluated in Emergency Department today for the symptoms described in the history of present illness. Patient was evaluated in the context of the global COVID-19 pandemic, which necessitated consideration that the patient might be at risk for infection with the SARS-CoV-2 virus that causes COVID-19. Institutional protocols and algorithms that pertain to the evaluation of patients at risk for COVID-19 are in a state of rapid change based on information released by regulatory bodies including the CDC and federal and state organizations. These policies and algorithms were followed during the patient's care in the ED.  Some ED evaluations and interventions may be delayed as a result of limited staffing during the pandemic.       Alfred Levins, [place], MD 11/22/21 [phone]

## 2022-03-02 ENCOUNTER — Encounter: Payer: No Typology Code available for payment source | Admitting: Physician Assistant

## 2022-03-17 ENCOUNTER — Ambulatory Visit (INDEPENDENT_AMBULATORY_CARE_PROVIDER_SITE_OTHER): Payer: Self-pay | Admitting: Physician Assistant

## 2022-03-17 ENCOUNTER — Encounter: Payer: Self-pay | Admitting: Physician Assistant

## 2022-03-17 VITALS — BP 118/85 | HR 84 | Temp 98.1°F | Resp 18 | Ht 71.0 in | Wt 255.0 lb

## 2022-03-17 DIAGNOSIS — Z87891 Personal history of nicotine dependence: Secondary | ICD-10-CM

## 2022-03-17 DIAGNOSIS — F411 Generalized anxiety disorder: Secondary | ICD-10-CM

## 2022-03-17 DIAGNOSIS — G4733 Obstructive sleep apnea (adult) (pediatric): Secondary | ICD-10-CM

## 2022-03-17 DIAGNOSIS — Z1159 Encounter for screening for other viral diseases: Secondary | ICD-10-CM

## 2022-03-17 DIAGNOSIS — Z114 Encounter for screening for human immunodeficiency virus [HIV]: Secondary | ICD-10-CM

## 2022-03-17 DIAGNOSIS — K219 Gastro-esophageal reflux disease without esophagitis: Secondary | ICD-10-CM

## 2022-03-17 DIAGNOSIS — Z0000 Encounter for general adult medical examination without abnormal findings: Secondary | ICD-10-CM

## 2022-03-17 DIAGNOSIS — E669 Obesity, unspecified: Secondary | ICD-10-CM

## 2022-03-17 NOTE — Progress Notes (Unsigned)
I,[name],acting as a Neurosurgeon for [employer], PA-C.,have documented all relevant documentation on the behalf of Derek Lat, PA-C,as directed by  [employer], PA-C while in the presence of [employer], PA-C.  Complete physical exam   Patient: Derek Ramirez   DOB: March 27, 1976   46 y.o. Male  MRN: [id number] Visit Date: 03/17/2022  Today's healthcare provider: [name], PA-C   Chief Complaint  Patient presents with   Annual Exam   Subjective    Derek Ramirez is a 46 y.o. male who presents today for a complete physical exam.  He reports consuming a general diet. Gym/ health club routine includes personal trainer twice a week. He generally feels fairly well. He reports sleeping fairly well. He does not have additional problems to discuss today.  HPI  Obesity Endorses gaining weight for the past 1.5 after his GI surgery/colon resection. Started going to GYM x 2/per week with his wife.  Sleep apnea Uses CPAP machine. Doing well. Sleeps well.  Anxiety Takes Sertraline. Compliant with his current treatment. Denies having side effects  GERD Takes Protonix. Compliant with his current treatment. Denies having side effects  Hx of kidney stones. Was seen at ED on 11/22/21 Former smoker. Quit in 2012/32 pack years.  Past Medical History:  Diagnosis Date   Asthma    WELL CONTROLLED   Complication of anesthesia    HARD TO WAKE UP AFTER COLONOSCOPY   Diverticulitis    GERD (gastroesophageal reflux disease)    History of chicken pox    History of IBS    History of measles    Panic disorder    Sleep apnea    USES CPAP   Past Surgical History:  Procedure Laterality Date   BACK SURGERY     LOWER   COLON RESECTION SIGMOID  08/14/2020   Procedure: SIGMOID COLECTOMY;  Surgeon: Duanne Guess, MD;  Location: [facility];  Service: General;;   CYSTOSCOPY WITH BIOPSY  08/14/2020   Procedure: CYSTOSCOPY WITH BIOPSY;  Surgeon: Sondra Come, MD;  Location: [facility];   Service: Urology;;   CYSTOSCOPY WITH STENT PLACEMENT Bilateral 08/14/2020   Procedure: CYSTOSCOPY WITH STENT PLACEMENT;  Surgeon: Sondra Come, MD;  Location: [facility];  Service: Urology;  Laterality: Bilateral;   LAPAROTOMY N/A 08/14/2020   Procedure: EXPLORATORY LAPAROTOMY WITH MOBILIZATION OF SPLENIC FLEXURE;  Surgeon: Duanne Guess, MD;  Location: [facility];  Service: General;  Laterality: N/A;   WISDOM TOOTH EXTRACTION     Social History   Socioeconomic History   Marital status: Married    Spouse name: Not on file   Number of children: Not on file   Years of education: Not on file   Highest education level: Not on file  Occupational History   Not on file  Tobacco Use   Smoking status: Former    Packs/day: 2.00    Years: 16.00    Pack years: 32.00    Types: Cigarettes    Quit date: 10/19/2010    Years since quitting: 11.4   Smokeless tobacco: Never  Vaping Use   Vaping Use: Never used  Substance and Sexual Activity   Alcohol use: No    Alcohol/week: 0.0 standard drinks   Drug use: No   Sexual activity: Not on file  Other Topics Concern   Not on file  Social History Narrative   Not on file   Social Determinants of Health   Financial Resource Strain: Not on file  Food  Insecurity: Not on file  Transportation Needs: Not on file  Physical Activity: Not on file  Stress: Not on file  Social Connections: Not on file  Intimate Partner Violence: Not on file   Family Status  Relation Name Status   Mother  Alive   Father  Alive   MGM  Deceased       MVA   MGF  Deceased       cause of death: MVA   PGM  Deceased   PGF  Deceased   Family History  Problem Relation Age of Onset   Diabetes Maternal Grandmother    Diabetes Maternal Grandfather    Lung cancer Paternal Grandmother        Due to Asbestos   Lung cancer Paternal Grandfather        due to Asbestos   Heart Problems Paternal Grandfather    Allergies  Allergen Reactions   Penicillins Anaphylaxis     Has patient had a PCN reaction causing immediate rash, facial/tongue/throat swelling, SOB or lightheadedness with hypotension: Yes Has patient had a PCN reaction causing severe rash involving mucus membranes or skin necrosis: No Has patient had a PCN reaction that required hospitalization {Yes/No:[id number]} Patient not sure Has patient had a PCN reaction occurring within the last 10 years: No If all of the above answers are "NO", then may proceed with Cephalosporin use.   Shellfish Allergy Anaphylaxis    Patient Care Team: Derek Lat, PA-C as PCP - General (Physician Assistant) Duanne Guess, MD as Consulting Physician (General Surgery)   Medications: Outpatient Medications Prior to Visit  Medication Sig   pantoprazole (PROTONIX) 20 MG tablet Take 1 tablet (20 mg total) by mouth daily.   sertraline (ZOLOFT) 50 MG tablet Take 1 tablet (50 mg total) by mouth daily.   [DISCONTINUED] ibuprofen (ADVIL) 800 MG tablet Take 1 tablet (800 mg total) by mouth every 8 (eight) hours as needed. (Patient not taking: Reported on 03/17/2022)   [DISCONTINUED] ondansetron (ZOFRAN-ODT) 4 MG disintegrating tablet Take 1 tablet (4 mg total) by mouth every 8 (eight) hours as needed for nausea or vomiting. (Patient not taking: Reported on 03/17/2022)   No facility-administered medications prior to visit.    Review of Systems  Constitutional:  Negative for appetite change, chills, fatigue and fever.  HENT:  Negative for congestion, ear pain, hearing loss, nosebleeds and trouble swallowing.   Eyes:  Negative for pain and visual disturbance.  Respiratory:  Positive for apnea. Negative for cough, chest tightness and shortness of breath.   Cardiovascular:  Negative for chest pain, palpitations and leg swelling.  Gastrointestinal:  Negative for abdominal pain, blood in stool, constipation, diarrhea, nausea and vomiting.  Endocrine: Negative for polydipsia, polyphagia and polyuria.  Genitourinary:  Negative for  dysuria and flank pain.  Musculoskeletal:  Negative for arthralgias, back pain, joint swelling, myalgias and neck stiffness.  Skin:  Negative for color change, rash and wound.  Allergic/Immunologic: Positive for environmental allergies and food allergies.  Neurological:  Negative for dizziness, tremors, seizures, speech difficulty, weakness, light-headedness and headaches.  Psychiatric/Behavioral:  Negative for behavioral problems, confusion, decreased concentration, dysphoric mood and sleep disturbance. The patient is not nervous/anxious.   All other systems reviewed and are negative.  {Labs  Heme  Chem  Endocrine  Serology  Results Review (optional):23779}  Objective     BP 118/85 (BP Location: Right Arm, Patient Position: Sitting, Cuff Size: Large)   Pulse 84   Temp 98.1 F (36.7 C) (Oral)  Resp 18   Ht 5\' 11"  (1.803 m)   Wt 255 lb (115.7 kg)   SpO2 98% Comment: room air  BMI 35.57 kg/m  {Show previous vital signs (optional):23777}   Physical Exam Vitals reviewed.  Constitutional:      General: He is not in acute distress.    Appearance: Normal appearance. He is well-developed. He is not diaphoretic.  HENT:     Head: Normocephalic and atraumatic.     Right Ear: Tympanic membrane, ear canal and external ear normal.     Left Ear: Tympanic membrane, ear canal and external ear normal.     Nose: Rhinorrhea present. No congestion.     Mouth/Throat:     Mouth: Mucous membranes are moist.     Pharynx: Oropharynx is clear. No oropharyngeal exudate.  Eyes:     General: No scleral icterus.       Right eye: No discharge.        Left eye: No discharge.     Extraocular Movements: Extraocular movements intact.     Conjunctiva/sclera: Conjunctivae normal.     Pupils: Pupils are equal, round, and reactive to light.  Neck:     Thyroid: No thyromegaly.  Cardiovascular:     Rate and Rhythm: Normal rate and regular rhythm.     Pulses: Normal pulses.     Heart sounds: Normal  heart sounds. No murmur heard. Pulmonary:     Effort: Pulmonary effort is normal. No respiratory distress.     Breath sounds: Normal breath sounds. No wheezing or rales.  Abdominal:     General: Bowel sounds are normal. There is no distension.     Palpations: Abdomen is soft.     Tenderness: There is no abdominal tenderness. There is no guarding.     Comments: Midline deep scar after colon surgery in 2021  Musculoskeletal:        General: No deformity. Normal range of motion.     Cervical back: Normal range of motion and neck supple. Tenderness present. No rigidity.     Right lower leg: No edema.     Left lower leg: No edema.  Lymphadenopathy:     Cervical: No cervical adenopathy.  Skin:    General: Skin is warm and dry.     Capillary Refill: Capillary refill takes less than 2 seconds.     Findings: No rash.  Neurological:     Mental Status: He is alert and oriented to person, place, and time. Mental status is at baseline.     Sensory: No sensory deficit.     Motor: No weakness.     Gait: Gait normal.  Psychiatric:        Behavior: Behavior normal.        Thought Content: Thought content normal.        Judgment: Judgment normal.      Last depression screening scores    03/17/2022    2:49 PM 03/27/2020    9:08 AM  PHQ 2/9 Scores  PHQ - 2 Score 0 0  PHQ- 9 Score 0    Last fall risk screening    03/17/2022    2:50 PM  Fall Risk   Falls in the past year? 0  Number falls in past yr: 0  Injury with Fall? 0  Risk for fall due to : No Fall Risks  Follow up Falls evaluation completed   Last Audit-C alcohol use screening    03/17/2022    2:50 PM  Alcohol Use Disorder Test (AUDIT)  1. How often do you have a drink containing alcohol? 0  2. How many drinks containing alcohol do you have on a typical day when you are drinking? 0  3. How often do you have six or more drinks on one occasion? 0  AUDIT-C Score 0   A score of 3 or more in women, and 4 or more in men indicates  increased risk for alcohol abuse, EXCEPT if all of the points are from question 1   No results found for any visits on 03/17/22.  Assessment & Plan    Routine Health Maintenance and Physical Exam  Exercise Activities and Dietary recommendations  Goals   None     Immunization History  Administered Date(s) Administered   Tdap 03/27/2020    Health Maintenance  Topic Date Due   COVID-19 Vaccine (1) Never done   HIV Screening  Never done   Hepatitis C Screening  Never done   INFLUENZA VACCINE  05/19/2022   TETANUS/TDAP  03/27/2030   HPV VACCINES  Aged Out    Discussed health benefits of physical activity, and encouraged him to engage in regular exercise appropriate for his age and condition.    1. Annual physical exam  - Hepatitis C Antibody - HIV antibody (with reflex) - CBC with Differential/Platelet - Comprehensive metabolic panel - Lipid panel - Hemoglobin A1c - TSH  2. Obstructive sleep apnea - Chronic, stable. Continue CPAP machine - TSH  3. Generalized anxiety disorder    10/21/2021   10:59 AM  GAD 7 : Generalized Anxiety Score  Nervous, Anxious, on Edge 0  Control/stop worrying 0  Worry too much - different things 0  Trouble relaxing 0  Restless 0  Easily annoyed or irritable 0  Afraid - awful might happen 0  Total GAD 7 Score 0  Anxiety Difficulty Not difficult at all    Continue Sertraline  4. Gastroesophageal reflux disease without esophagitis Continue PPI  Elevate the head of the bed 6-8 inches, avoid recumbency for 3 hours after eating, avoid food as a delayed gastric emptying, weight loss     5. Former smoker  6. Encounter for screening for HIV  - HIV antibody (with reflex)  7. Need for hepatitis C screening test  - Hepatitis C Antibody  8. Obesity (BMI 35.0-39.9 without comorbidity) Initial workup - CBC with Differential/Platelet - Comprehensive metabolic panel - Lipid panel - Hemoglobin A1c - TSH   The patient was advised  to call back or seek an in-person evaluation if the symptoms worsen or if the condition fails to improve as anticipated.  I discussed the assessment and treatment plan with the patient. The patient was provided an opportunity to ask questions and all were answered. The patient agreed with the plan and demonstrated an understanding of the instructions.  The entirety of the information documented in the History of Present Illness, Review of Systems and Physical Exam were personally obtained by me. Portions of this information were initially documented by the CMA and reviewed by me for thoroughness and accuracy.  Portions of this note were created using dictation software and may contain typographical errors.     Derek Lat, PA-C  [facility] [phone] (phone) [phone] (fax)  [facility]

## 2022-03-18 LAB — COMPREHENSIVE METABOLIC PANEL
ALT: 28 IU/L (ref 0–44)
AST: 21 IU/L (ref 0–40)
Albumin/Globulin Ratio: 2.1 (ref 1.2–2.2)
Albumin: 4.9 g/dL (ref 4.0–5.0)
Alkaline Phosphatase: 109 IU/L (ref 44–121)
BUN/Creatinine Ratio: 13 (ref 9–20)
BUN: 17 mg/dL (ref 6–24)
Bilirubin Total: 0.5 mg/dL (ref 0.0–1.2)
CO2: 23 mmol/L (ref 20–29)
Calcium: 10.2 mg/dL (ref 8.7–10.2)
Chloride: 103 mmol/L (ref 96–106)
Creatinine, Ser: 1.28 mg/dL — ABNORMAL HIGH (ref 0.76–1.27)
Globulin, Total: 2.3 g/dL (ref 1.5–4.5)
Glucose: 86 mg/dL (ref 70–99)
Potassium: 4.4 mmol/L (ref 3.5–5.2)
Sodium: 141 mmol/L (ref 134–144)
Total Protein: 7.2 g/dL (ref 6.0–8.5)
eGFR: 71 mL/min/{1.73_m2} (ref >59)

## 2022-03-18 LAB — CBC WITH DIFFERENTIAL/PLATELET
Basophils Absolute: 0.1 10*3/uL (ref 0.0–0.2)
Basos: 1 %
EOS (ABSOLUTE): 0.2 10*3/uL (ref 0.0–0.4)
Eos: 3 %
Hematocrit: 44.5 % (ref 37.5–51.0)
Hemoglobin: 14.9 g/dL (ref 13.0–17.7)
Immature Grans (Abs): 0 10*3/uL (ref 0.0–0.1)
Immature Granulocytes: 0 %
Lymphocytes Absolute: 2.2 10*3/uL (ref 0.7–3.1)
Lymphs: 30 %
MCH: 28.1 pg (ref 26.6–33.0)
MCHC: 33.5 g/dL (ref 31.5–35.7)
MCV: 84 fL (ref 79–97)
Monocytes Absolute: 0.7 10*3/uL (ref 0.1–0.9)
Monocytes: 10 %
Neutrophils Absolute: 4 10*3/uL (ref 1.4–7.0)
Neutrophils: 56 %
Platelets: 275 10*3/uL (ref 150–450)
RBC: 5.31 x10E6/uL (ref 4.14–5.80)
RDW: 13.2 % (ref 11.6–15.4)
WBC: 7.3 10*3/uL (ref 3.4–10.8)

## 2022-03-18 LAB — LIPID PANEL
Chol/HDL Ratio: 5.9 ratio — ABNORMAL HIGH (ref 0.0–5.0)
Cholesterol, Total: 234 mg/dL — ABNORMAL HIGH (ref 100–199)
HDL: 40 mg/dL (ref >39)
LDL Chol Calc (NIH): 141 mg/dL — ABNORMAL HIGH (ref 0–99)
Triglycerides: 289 mg/dL — ABNORMAL HIGH (ref 0–149)
VLDL Cholesterol Cal: 53 mg/dL — ABNORMAL HIGH (ref 5–40)

## 2022-03-18 LAB — HEPATITIS C ANTIBODY: Hep C Virus Ab: NONREACTIVE

## 2022-03-18 LAB — TSH: TSH: 2.58 u[IU]/mL (ref 0.450–4.500)

## 2022-03-18 LAB — HEMOGLOBIN A1C
Est. average glucose Bld gHb Est-mCnc: 114 mg/dL
Hgb A1c MFr Bld: 5.6 % (ref 4.8–5.6)

## 2022-03-18 LAB — HIV ANTIBODY (ROUTINE TESTING W REFLEX): HIV Screen 4th Generation wRfx: NONREACTIVE

## 2022-03-19 DIAGNOSIS — F411 Generalized anxiety disorder: Secondary | ICD-10-CM | POA: Insufficient documentation

## 2022-03-19 DIAGNOSIS — G4733 Obstructive sleep apnea (adult) (pediatric): Secondary | ICD-10-CM | POA: Insufficient documentation

## 2022-03-19 DIAGNOSIS — Z1159 Encounter for screening for other viral diseases: Secondary | ICD-10-CM | POA: Insufficient documentation

## 2022-03-19 DIAGNOSIS — Z0000 Encounter for general adult medical examination without abnormal findings: Secondary | ICD-10-CM | POA: Insufficient documentation

## 2022-03-19 DIAGNOSIS — E669 Obesity, unspecified: Secondary | ICD-10-CM | POA: Insufficient documentation

## 2022-03-19 DIAGNOSIS — Z114 Encounter for screening for human immunodeficiency virus [HIV]: Secondary | ICD-10-CM | POA: Insufficient documentation

## 2022-04-17 ENCOUNTER — Ambulatory Visit: Payer: No Typology Code available for payment source | Admitting: Physician Assistant

## 2022-04-20 ENCOUNTER — Telehealth: Payer: Self-pay | Admitting: Physician Assistant

## 2022-04-20 ENCOUNTER — Other Ambulatory Visit: Payer: Self-pay

## 2022-04-20 DIAGNOSIS — F411 Generalized anxiety disorder: Secondary | ICD-10-CM

## 2022-04-20 MED ORDER — SERTRALINE HCL 50 MG PO TABS: 50.0000 mg | ORAL_TABLET | Freq: Every day | ORAL | 1 refills | Status: DC

## 2022-04-20 NOTE — Telephone Encounter (Signed)
Rx refilled.

## 2022-04-20 NOTE — Telephone Encounter (Signed)
[facility] faxed refill request for the following medications:  sertraline (ZOLOFT) 50 MG tablet   Please advise.

## 2022-05-26 ENCOUNTER — Encounter: Payer: Self-pay | Admitting: Urology

## 2022-05-26 ENCOUNTER — Ambulatory Visit (INDEPENDENT_AMBULATORY_CARE_PROVIDER_SITE_OTHER): Payer: No Typology Code available for payment source | Admitting: Urology

## 2022-05-26 VITALS — BP 124/82 | HR 75 | Ht 70.0 in | Wt 255.0 lb

## 2022-05-26 DIAGNOSIS — R7989 Other specified abnormal findings of blood chemistry: Secondary | ICD-10-CM

## 2022-05-26 DIAGNOSIS — R350 Frequency of micturition: Secondary | ICD-10-CM

## 2022-05-26 DIAGNOSIS — R399 Unspecified symptoms and signs involving the genitourinary system: Secondary | ICD-10-CM

## 2022-05-26 DIAGNOSIS — E291 Testicular hypofunction: Secondary | ICD-10-CM | POA: Diagnosis not present

## 2022-05-26 NOTE — Progress Notes (Signed)
   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  He underwent biopsy with me that showed cystitis and inflammation but no evidence of malignancy, bowel resection with general surgery, and has done well since that time.  He has some occasional urgency and frequency of urination, but this seems to be associated with sweet tea intake, and we reviewed behavioral strategies regarding voiding.  Urinalysis in February 2023 was completely benign.  His primary issue today is symptoms of low testosterone over the last few years including weight gain, mood swings, decreased sex drive, fatigue, decreased hair growth, and decreased response to working out.  He did an at home saliva test that was reportedly low.  We discussed the AUA guidelines regarding evaluation and management of patients with low testosterone, and risk and benefits were discussed at length.  I recommended starting with a morning total testosterone level, and we discussed pending those results could trial either Clomid or exogenous testosterone replacement.  We reviewed the recommended blood work per the AUA guidelines including PSA. Recent hematocrit in May 2023 was normal at 44.5.  Morning testosterone, call with results Consider trial of Clomid or follow-up with PA to discuss alternative exogenous testosterone options pending above Reassurance provided regarding intermittent urinary symptoms secondary to sweet tea intake  Billey Co, MD  [facility] [address] [place], [place] [address] [phone]

## 2022-05-26 NOTE — Patient Instructions (Signed)
Hypogonadism, Male  Male hypogonadism is a condition of having a level of testosterone that is lower than normal. Testosterone is a chemical, or hormone, that is made mainly in the testicles. In boys, testosterone is responsible for the development of male characteristics during puberty. These include: Making the penis bigger. Growing and building the muscles. Growing facial hair. Deepening the voice. In adult men, testosterone is responsible for maintaining: An interest in sex and the ability to have sex. Muscle mass. Sperm production. Red blood cell production. Bone strength. Testosterone also gives men energy and a sense of well-being. Testosterone normally decreases as men age and the testicles make less testosterone. Testosterone levels can vary from man to man. Not all men will have signs and symptoms of low testosterone. Weight, alcohol use, medicines, and certain medical conditions can affect a man's testosterone level. What are the causes? This condition is caused by: A natural decrease in testosterone that occurs as a man grows older. This is the main cause of this condition. Use of medicines, such as antidepressants, steroids, and opioids. Diseases and conditions that affect the testicles or the making of testosterone. These include: Injury or damage to the testicles from trauma, cancer, cancer treatment, or infection. Diabetes. Sleep apnea. Genetic conditions that men are born with. Disease of the pituitary gland. This gland is in the brain. It produces hormones. Obesity. Metabolic syndrome. This is a group of diseases that affect blood pressure, blood sugar, cholesterol, and belly fat. HIV or AIDS. Alcohol abuse. Kidney failure. Other long-term or chronic diseases. What are the signs or symptoms? Common symptoms of this condition include: Loss of interest in sex (low sex drive). Inability to have or maintain an erection (erectile dysfunction). Feeling tired  (fatigue). Mood changes, like irritability or depression. Loss of muscle and body hair. Infertility. Large breasts. Weight gain (obesity). How is this diagnosed? Your health care provider can diagnose hypogonadism based on: Your signs and symptoms. A physical exam to check your testosterone levels. This includes blood tests. Testosterone levels can change throughout the day. Levels are highest in the morning. You may need to have repeat blood tests before getting a diagnosis of hypogonadism. Depending on your medical history and test results, your health care provider may also do other tests to find the cause of low testosterone. How is this treated? This condition is treated with testosterone replacement therapy. Testosterone can be given by: Injection or through pellets inserted under the skin. Gels or patches placed on the skin or in the mouth. Testosterone therapy is not for everyone. It has risks and side effects. Your health care provider will consider your medical history, your risk for prostate cancer, your age, and your symptoms before putting you on testosterone replacement therapy. Follow these instructions at home: Take over-the-counter and prescription medicines only as told by your health care provider. Eat foods that are high in fiber, such as beans, whole grains, and fresh fruits and vegetables. Limit foods that are high in fat and processed sugars, such as fried or sweet foods. If you drink alcohol: Limit how much you have to 0-2 drinks a day. Know how much alcohol is in your drink. In the [place] one drink equals one 12 oz bottle of beer (355 mL), one 5 oz glass of wine (148 mL), or one 1 oz glass of hard liquor (44 mL). Return to your normal activities as told by your health care provider. Ask your health care provider what activities are safe for you. Keep all   follow-up visits. This is important. Contact a health care provider if: You have any of the signs or symptoms of  low testosterone. You have any side effects from testosterone therapy. Summary Male hypogonadism is a condition of having a level of testosterone that is lower than normal. The natural drop in testosterone production that occurs with age is the most common cause of this condition. Low testosterone can also be caused by many diseases and conditions that affect the testicles and the making of testosterone. This condition is treated with testosterone replacement therapy. There are risks and side effects of testosterone therapy. Your health care provider will consider your age, medical history, symptoms, and risks for prostate cancer before putting you on testosterone therapy. This information is not intended to replace advice given to you by your health care provider. Make sure you discuss any questions you have with your health care provider. Document Revised: 06/06/2020 Document Reviewed: 06/06/2020 Elsevier Patient Education  2023 [employer] Inc.  Testosterone Replacement Therapy Testosterone replacement therapy (TRT) treats men who have a low testosterone level. Testosterone is a male hormone that is produced in the testicles. It is responsible for typical male characteristics and for maintaining a man's sex drive and ability to get an erection. Testosterone also supports bone and muscle health. Low testosterone may not need to be treated. Your health care provider may recommend TRT if you have symptoms such as a low sex drive or erection problems, weak muscles or bones, or low energy. Types of TRT  You and your health care provider will decide which form is best for you. TRT is available in the following forms: Topical gels, creams, lotions, or sprays. Do not let other people, especially women or children, come in contact with the skin where the testosterone was applied. Nasal gels. Patches. Pills. Injections into the muscle or under the skin. Long-acting pellets inserted under the skin. The  amount of TRT you take and how long you take it is based on your condition. It is important to: Begin TRT with the lowest possible dosage. Stop TRT if your health care provider tells you to stop. Work with your health care provider so that you feel informed and comfortable with your decision. Tell a health care provider about: Any allergies you have. Any personal or family history of blood clots, breast cancer, or prostate cancer. If you have sleep apnea or have been told that you snore. All medicines you are taking, including vitamins, herbs, eye drops, creams, and over-the-counter medicines. Any surgeries you have had. Any other medical conditions you have. If you wish to have biological children someday. What are the benefits? Benefits of TRT vary but may include improved sexual function, muscle mass or strength, mood, or quality of life. What are the risks? Risks of TRT vary depending on your individual and medical history. Side effects can be related to the type of TRT you choose. If you choose products that are used on the skin, you may have skin irritation. If you get injections, you may have redness and swelling at the injection site. Side effects that can happen with any form of TRT include: Lower sperm count. Acne. Swelling of your legs or feet, or tenderness in the chest or breast area. Sleep disturbances and mood swings. Enlarged prostate. Increase in your red blood count. It is unclear if testosterone increases the risk of some serious conditions. Talk with your health care provider about your risk for these conditions, including: Blood clots. Heart disease, such  as stroke or heart attack. Prostate cancer. Risks of TRT may increase if you: Have had or are at risk for prostate cancer or breast cancer. Have had a previous stroke or heart attack. Have a high number of red blood cells. Have treated or untreated sleep apnea. Have a large prostate. The long-term safety of TRT  is not known. Follow these instructions at home: Take over-the-counter and prescription medicines only as told by your health care provider. Lose weight if you are overweight. Ask your health care provider to help you start a healthy diet and exercise program to reach and maintain a healthy weight. Work with your health care provider to manage other medical conditions that may lower your testosterone. These include obesity, high blood pressure, high cholesterol, diabetes, liver disease, kidney disease, and sleep apnea. Do not use any testosterone replacement therapies that are not prescribed by your health care provider. Keep all follow-up visits. This is important. Where to find more information American [employer]: www.urologyhealth.org Endocrine Society: www.hormone.org Contact a health care provider if: You have side effects from your TRT. You have pain or swelling in your legs. You have problems urinating. You have lumps or changes in your breasts or armpits. Get help right away if: You have shortness of breath. You have chest pain. You have slurred speech. You have weakness or numbness in any part of your arms or legs. These symptoms may represent a serious problem that is an emergency. Do not wait to see if the symptoms will go away. Get medical help right away. Call your local emergency services (911 in the [place] Do not drive yourself to the hospital. Summary Testosterone replacement therapy (TRT) is used to treat men who have a low testosterone level. TRT should only be prescribed by and under the supervision of a health care provider. Tell a health care provider about any medical conditions you have. TRT may have side effects. Talk with your health care provider about all of the risks and benefits before you start therapy. This information is not intended to replace advice given to you by your health care provider. Make sure you discuss any questions you have with  your health care provider. Document Revised: 07/30/2020 Document Reviewed: 07/30/2020 Elsevier Patient Education  2023 [employer].

## 2022-05-28 ENCOUNTER — Other Ambulatory Visit
Admission: RE | Admit: 2022-05-28 | Discharge: 2022-05-28 | Disposition: A | Discharge status: Home / Self Care | Payer: No Typology Code available for payment source | Attending: Urology | Admitting: Urology

## 2022-05-28 DIAGNOSIS — R7989 Other specified abnormal findings of blood chemistry: Secondary | ICD-10-CM | POA: Insufficient documentation

## 2022-05-29 LAB — TESTOSTERONE: Testosterone: 149 ng/dL — ABNORMAL LOW (ref 264–916)

## 2022-06-03 ENCOUNTER — Ambulatory Visit: Payer: No Typology Code available for payment source | Admitting: Urology

## 2022-06-05 ENCOUNTER — Telehealth: Payer: Self-pay | Admitting: Urology

## 2022-06-05 MED ORDER — CLOMIPHENE CITRATE 50 MG PO TABS: 25.0000 mg | ORAL_TABLET | Freq: Every day | ORAL | 0 refills | Status: DC

## 2022-06-05 NOTE — Telephone Encounter (Signed)
Spoke with patient and advised rx sent to pharmacy. 

## 2022-06-05 NOTE — Telephone Encounter (Signed)
Per Sninsky's note, can you please send clomid 25mg  daily  to in [place].

## 2022-06-05 NOTE — Telephone Encounter (Signed)
.  left message to have patient return my call.   I was communicating with patient via mychart asking for pharmacy name, pt [facility] today [facility].  rx sent to pharmacy by e-script     Per Dr. Richardo Hanks:   Testosterone low at 149, recommend clomid 25mg  daily w/RTC 6 weeks AM testosterone prior   What pharmacy would you like Clomid sent to? I will also schedule you for repeat testosterone in 6 weeks.  Written by , CMA on 06/03/2022  1:26 PM EDT Seen by patient 06/05/2022 on 06/05/2022 10:17 AM

## 2022-07-14 ENCOUNTER — Other Ambulatory Visit: Payer: Self-pay | Admitting: Family Medicine

## 2022-07-14 DIAGNOSIS — R7989 Other specified abnormal findings of blood chemistry: Secondary | ICD-10-CM

## 2022-07-15 ENCOUNTER — Other Ambulatory Visit: Payer: No Typology Code available for payment source

## 2022-07-15 ENCOUNTER — Other Ambulatory Visit
Admission: RE | Admit: 2022-07-15 | Discharge: 2022-07-15 | Disposition: A | Discharge status: Home / Self Care | Payer: No Typology Code available for payment source | Attending: Urology | Admitting: Urology

## 2022-07-15 ENCOUNTER — Encounter: Payer: Self-pay | Admitting: Urology

## 2022-07-15 DIAGNOSIS — R7989 Other specified abnormal findings of blood chemistry: Secondary | ICD-10-CM | POA: Diagnosis present

## 2022-07-15 NOTE — Addendum Note (Signed)
Addended by: Antonieta Iba C on: 07/15/2022 08:01 AM   Modules accepted: Orders

## 2022-07-16 ENCOUNTER — Telehealth: Payer: Self-pay

## 2022-07-16 ENCOUNTER — Other Ambulatory Visit: Payer: Self-pay | Admitting: *Deleted

## 2022-07-16 DIAGNOSIS — R7989 Other specified abnormal findings of blood chemistry: Secondary | ICD-10-CM

## 2022-07-16 LAB — TESTOSTERONE: Testosterone: 679 ng/dL (ref 264–916)

## 2022-07-16 NOTE — Telephone Encounter (Signed)
I left a message for the patient to return my call.

## 2022-07-16 NOTE — Telephone Encounter (Signed)
-----   Message from Billey Co, MD sent at 07/16/2022  9:52 AM EDT ----- Good news, testosterone has increased all the way to 679 on the Clomid.  He should continue this medication and follow-up in 6 months with testosterone and CMP prior, thanks  Nickolas Madrid, MD 07/16/2022

## 2022-08-10 ENCOUNTER — Other Ambulatory Visit: Payer: Self-pay | Admitting: Urology

## 2022-08-10 MED ORDER — CLOMIPHENE CITRATE 50 MG PO TABS: 25.0000 mg | ORAL_TABLET | Freq: Every day | ORAL | 1 refills | Status: DC

## 2022-10-11 ENCOUNTER — Ambulatory Visit
Admission: EM | Admit: 2022-10-11 | Discharge: 2022-10-11 | Disposition: A | Discharge status: Home / Self Care | Payer: No Typology Code available for payment source

## 2022-10-11 DIAGNOSIS — S058X2A Other injuries of left eye and orbit, initial encounter: Secondary | ICD-10-CM

## 2022-10-11 NOTE — ED Provider Notes (Signed)
[facility]    CSN: [id number] Arrival date & time: 10/11/22  1318      History   Chief Complaint Chief Complaint  Patient presents with   Eye Problem    HPI Reily Ilic is a 46 y.o. male.    Eye Problem   Patient is accompanied with his significant other.  He presents to urgent care with complaint of left eye irritation.  States he was working in his yard and thinks he got sawdust in his eye.  He states he got out a piece of "something" that may have been causing the irritation but continues to endorse redness and discomfort at the site.  Past Medical History:  Diagnosis Date   Asthma    WELL CONTROLLED   Complication of anesthesia    HARD TO WAKE UP AFTER COLONOSCOPY   Diverticulitis    GERD (gastroesophageal reflux disease)    History of chicken pox    History of IBS    History of measles    Panic disorder    Sleep apnea    USES CPAP    Patient Active Problem List   Diagnosis Date Noted   Annual physical exam 03/19/2022   Obstructive sleep apnea 03/19/2022   Generalized anxiety disorder 03/19/2022   Need for hepatitis C screening test 03/19/2022   Encounter for screening for HIV 03/19/2022   Obesity (BMI 35.0-39.9 without comorbidity) 03/19/2022   S/P partial colectomy 08/14/2020   Diverticulitis of large intestine with perforation without bleeding 04/08/2020   Lumbar radiculopathy 07/05/2018   Allergic rhinitis 10/31/2015   Anxiety 10/31/2015   Chest pain 10/31/2015   GERD (gastroesophageal reflux disease) 10/31/2015   Heartburn 10/31/2015   Palpitations 10/31/2015   Panic disorder 10/31/2015   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   Headache, tension-type 03/01/2008   Tobacco use 03/17/2006   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    Past Surgical History:  Procedure Laterality Date   BACK SURGERY     LOWER   COLON RESECTION SIGMOID  08/14/2020   Procedure: SIGMOID COLECTOMY;  Surgeon: Duanne Guess, MD;  Location: [facility]  ORS;  Service: General;;   CYSTOSCOPY WITH BIOPSY  08/14/2020   Procedure: CYSTOSCOPY WITH BIOPSY;  Surgeon: Sondra Come, MD;  Location: [facility] ORS;  Service: Urology;;   CYSTOSCOPY WITH STENT PLACEMENT Bilateral 08/14/2020   Procedure: CYSTOSCOPY WITH STENT PLACEMENT;  Surgeon: Sondra Come, MD;  Location: [facility] ORS;  Service: Urology;  Laterality: Bilateral;   LAPAROTOMY N/A 08/14/2020   Procedure: EXPLORATORY LAPAROTOMY WITH MOBILIZATION OF SPLENIC FLEXURE;  Surgeon: Duanne Guess, MD;  Location: [facility] ORS;  Service: General;  Laterality: N/A;   WISDOM TOOTH EXTRACTION         Home Medications    Prior to Admission medications   Medication Sig Start Date End Date Taking? Authorizing Provider  clomiPHENE (CLOMID) 50 MG tablet Take 0.5 tablets (25 mg total) by mouth daily. 08/10/22 02/06/23  Sondra Come, MD  pantoprazole (PROTONIX) 20 MG tablet Take 1 tablet (20 mg total) by mouth daily. 08/20/21   Malva Limes, MD  sertraline (ZOLOFT) 50 MG tablet Take 1 tablet (50 mg total) by mouth daily. 04/20/22   [name], PA-C    Family History Family History  Problem Relation Age of Onset   Diabetes Maternal Grandmother    Diabetes Maternal Grandfather    Lung cancer Paternal Grandmother        Due to Asbestos   Lung cancer Paternal Grandfather  due to Asbestos   Heart Problems Paternal Grandfather     Social History Social History   Tobacco Use   Smoking status: Former    Packs/day: 2.00    Years: 16.00    Total pack years: 32.00    Types: Cigarettes    Quit date: 10/19/2010    Years since quitting: 11.9    Passive exposure: Past   Smokeless tobacco: Never  Vaping Use   Vaping Use: Never used  Substance Use Topics   Alcohol use: No    Alcohol/week: 0.0 standard drinks of alcohol   Drug use: No     Allergies   Penicillins and Shellfish allergy   Review of Systems Review of Systems   Physical Exam Triage Vital Signs ED Triage Vitals   Enc Vitals Group     BP 10/11/22 1541 (!) 132/91     Pulse Rate 10/11/22 1541 84     Resp 10/11/22 1541 18     Temp 10/11/22 1541 98.1 F (36.7 C)     Temp src --      SpO2 10/11/22 1541 98 %     Weight --      Height --      Head Circumference --      Peak Flow --      Pain Score 10/11/22 1537 0     Pain Loc --      Pain Edu? --      Excl. in GC? --    No data found.  Updated Vital Signs BP (!) 132/91   Pulse 84   Temp 98.1 F (36.7 C)   Resp 18   SpO2 98%   Visual Acuity Right Eye Distance:   Left Eye Distance:   Bilateral Distance:    Right Eye Near:   Left Eye Near:    Bilateral Near:     Physical Exam Vitals reviewed.  Constitutional:      Appearance: Normal appearance.  Eyes:      Comments: Possible corneal/scleral abrasion due to foreign body which no longer appears to be in the eye.  Reviewed eye with magnification device after application of fluorescein dye.  There was dye pick up.  Skin:    General: Skin is warm and dry.  Neurological:     General: No focal deficit present.     Mental Status: He is alert and oriented to person, place, and time.  Psychiatric:        Mood and Affect: Mood normal.        Behavior: Behavior normal.      UC Treatments / Results  Labs (all labs ordered are listed, but only abnormal results are displayed) Labs Reviewed - No data to display  EKG   Radiology No results found.  Procedures Procedures (including critical care time)  Medications Ordered in UC Medications - No data to display  Initial Impression / Assessment and Plan / UC Course  I have reviewed the triage vital signs and the nursing notes.  Pertinent labs & imaging results that were available during my care of the patient were reviewed by me and considered in my medical decision making (see chart for details).   Applied fluorescein dye and viewed the using magnification.  There is dye pick up at the inferior portion of the cornea and sclera.   Area is approximately 2 cm horizontally and irregularly-shaped.  Suspect scleral abrasion due to foreign body which no longer appears to be present.  Following the  examination of his eye I rinsed the eye with saline.  Also gave him tetracaine ophthalmic eyedrops caused initial burning but then relieved his discomfort.   Final Clinical Impressions(s) / UC Diagnoses   Final diagnoses:  None   Discharge Instructions   None    ED Prescriptions   None    PDMP not reviewed this encounter.   Charma Igo, [place] 10/11/22 1624

## 2022-10-11 NOTE — Discharge Instructions (Addendum)
Rinse left eye as needed with saline.  Applied tetracaine eyedrops to relieve discomfort.  Follow up here or with your primary care provider if your symptoms are worsening or not improving.

## 2022-10-11 NOTE — ED Triage Notes (Signed)
Pt. Presents to UC w/ c/o left eye irritation. Pt. States he was working in the yard today  and thinks dust got into his eye. Pt. Endorses redness and discomfort @ the site.

## 2022-11-06 ENCOUNTER — Other Ambulatory Visit: Payer: Self-pay | Admitting: Family Medicine

## 2022-11-06 DIAGNOSIS — K219 Gastro-esophageal reflux disease without esophagitis: Secondary | ICD-10-CM

## 2023-01-04 ENCOUNTER — Other Ambulatory Visit: Payer: No Typology Code available for payment source

## 2023-01-04 DIAGNOSIS — R7989 Other specified abnormal findings of blood chemistry: Secondary | ICD-10-CM

## 2023-01-05 LAB — COMPREHENSIVE METABOLIC PANEL
ALT: 28 IU/L (ref 0–44)
AST: 29 IU/L (ref 0–40)
Albumin/Globulin Ratio: 2 (ref 1.2–2.2)
Albumin: 4.3 g/dL (ref 4.1–5.1)
Alkaline Phosphatase: 100 IU/L (ref 44–121)
BUN/Creatinine Ratio: 12 (ref 9–20)
BUN: 15 mg/dL (ref 6–24)
Bilirubin Total: 0.6 mg/dL (ref 0.0–1.2)
CO2: 20 mmol/L (ref 20–29)
Calcium: 9 mg/dL (ref 8.7–10.2)
Chloride: 105 mmol/L (ref 96–106)
Creatinine, Ser: 1.29 mg/dL — ABNORMAL HIGH (ref 0.76–1.27)
Globulin, Total: 2.1 g/dL (ref 1.5–4.5)
Glucose: 96 mg/dL (ref 70–99)
Potassium: 4.8 mmol/L (ref 3.5–5.2)
Sodium: 140 mmol/L (ref 134–144)
Total Protein: 6.4 g/dL (ref 6.0–8.5)
eGFR: 70 mL/min/{1.73_m2} (ref >59)

## 2023-01-05 LAB — TESTOSTERONE: Testosterone: 347 ng/dL (ref 264–916)

## 2023-01-07 ENCOUNTER — Ambulatory Visit: Payer: No Typology Code available for payment source | Admitting: Urology

## 2023-01-12 ENCOUNTER — Ambulatory Visit (INDEPENDENT_AMBULATORY_CARE_PROVIDER_SITE_OTHER): Payer: No Typology Code available for payment source | Admitting: Urology

## 2023-01-12 ENCOUNTER — Encounter: Payer: Self-pay | Admitting: Urology

## 2023-01-12 ENCOUNTER — Other Ambulatory Visit: Payer: Self-pay

## 2023-01-12 VITALS — BP 125/76 | HR 85 | Ht 70.0 in | Wt 249.0 lb

## 2023-01-12 DIAGNOSIS — E291 Testicular hypofunction: Secondary | ICD-10-CM | POA: Diagnosis not present

## 2023-01-12 MED ORDER — CLOMIPHENE CITRATE 50 MG PO TABS: 25.0000 mg | ORAL_TABLET | Freq: Every day | ORAL | 3 refills | Status: AC

## 2023-01-12 NOTE — Patient Instructions (Signed)
The Benefits of a Plant-Based Diet for Urology Health  A plant-based diet emphasizes the consumption of whole, unprocessed plant foods while minimizing or excluding animal products including meat and dairy products. This dietary approach has gained attention for its potential to promote overall health, including urology-related conditions. Incorporating a plant-based diet into your lifestyle can offer numerous benefits for maintaining optimal urology health.  1. Reduced Risk of Kidney Stones: A plant-based diet is typically rich in fruits, vegetables, legumes, and whole grains. These foods are high in dietary fiber, potassium, and magnesium, which can help reduce the risk of developing kidney stones. Be careful to avoid high quantities of spinach, as these can contribute to kidney stone formation if eaten in large volumes. The increased intake of water-soluble fiber can enhance the excretion of waste products and prevent the crystallization of minerals that lead to stone formation.  2. Improved Prostate Health: Studies have suggested a link between the consumption of red and processed meats and an increased risk of prostate problems, including benign prostatic hyperplasia (BPH) and prostate cancer. By adopting a plant-based diet, you can lower your intake of saturated fats and decrease the risk of these conditions. PSA levels can often decrease on plant based diets! Plant foods are also rich in antioxidants and phytochemicals that have been associated with prostate health.  3. Better Bladder Function: A diet focused on plant-based foods can contribute to better bladder health by reducing the risk of urinary tract infections (UTIs). Berries, citrus fruits, and leafy greens are known for their high vitamin C content, which can acidify urine and create an environment less favorable for bacteria growth. Additionally, plant-based diets are generally lower in sodium, which can help prevent fluid retention and  reduce the strain on the bladder.  4. Management of Erectile Dysfunction (ED): Some research suggests that a plant-based diet can positively impact erectile function. Plant-based diets are associated with improved cardiovascular health, which is crucial for maintaining healthy blood flow and nerve function required for proper erectile function. By reducing the consumption of high-cholesterol and high-saturated fat animal products, a plant-based diet may contribute to a decreased risk of ED.  5. Prevention of Chronic Conditions: A plant-based diet can help prevent or manage chronic conditions such as obesity, diabetes, and hypertension. These conditions can contribute to urology-related issues, including urinary incontinence and kidney dysfunction. By maintaining a healthy weight and managing these conditions, you can reduce the risk of urology-related complications.  Conclusion: Embracing a plant-based diet can offer significant benefits for urology health. By incorporating a variety of colorful fruits, vegetables, whole grains, nuts, seeds, and legumes into your meals, you can support kidney health, prostate health, bladder function, and overall well-being. Remember to consult with a healthcare professional or registered dietitian before making any significant dietary changes, especially if you have existing health conditions. Your personalized approach to a plant-based diet can contribute to improved urology health and enhance your quality of life.      

## 2023-01-12 NOTE — Progress Notes (Signed)
   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  He denies any urinary symptoms.  He also has a history of a low testosterone of 149 with symptoms of weight gain, mood swings, low libido, fatigue, memory problems, decreased hair growth.  He opted for Clomid 25 mg daily which was working very well, and testosterone increased to 679 with improvement in his symptoms.  He ran out of this medication about 3 weeks ago and a testosterone was checked last week which had dropped to 347.  He is interested in resuming Clomid.  CMP was reviewed, normal liver function, no adverse side effects.  Risk and benefits discussed at length.  Resume Clomid 25 mg daily, prescription refilled RTC 1 year for testosterone, CMP, H/H   Billey Co, MD  [facility] [address], [name], [name] [address] [phone]

## 2023-01-13 ENCOUNTER — Ambulatory Visit: Payer: No Typology Code available for payment source | Admitting: Urology

## 2023-01-28 ENCOUNTER — Other Ambulatory Visit: Payer: Self-pay | Admitting: Physician Assistant

## 2023-01-28 DIAGNOSIS — F411 Generalized anxiety disorder: Secondary | ICD-10-CM

## 2023-01-28 NOTE — Telephone Encounter (Signed)
Last 03/17/22 Last refill 04/20/22  Please advise

## 2023-02-08 ENCOUNTER — Telehealth: Payer: Self-pay | Admitting: Physician Assistant

## 2023-02-08 ENCOUNTER — Other Ambulatory Visit: Payer: Self-pay

## 2023-02-08 DIAGNOSIS — K219 Gastro-esophageal reflux disease without esophagitis: Secondary | ICD-10-CM

## 2023-02-08 NOTE — Telephone Encounter (Signed)
[facility] requesting prescription refill pantoprazole (PROTONIX) 20 MG tablet  Please advise

## 2023-03-02 ENCOUNTER — Telehealth: Payer: Self-pay | Admitting: Physician Assistant

## 2023-03-02 NOTE — Telephone Encounter (Signed)
[facility] requesting prescription refill pantoprazole (PROTONIX) 20 MG tablet  Please advise

## 2023-03-03 ENCOUNTER — Other Ambulatory Visit: Payer: Self-pay

## 2023-03-03 DIAGNOSIS — K219 Gastro-esophageal reflux disease without esophagitis: Secondary | ICD-10-CM

## 2023-03-05 ENCOUNTER — Telehealth: Payer: Self-pay | Admitting: Physician Assistant

## 2023-03-05 NOTE — Telephone Encounter (Signed)
[facility] requesting prescription refill pantoprazole (PROTONIX) 20 MG tablet   Please advise

## 2023-03-07 ENCOUNTER — Telehealth: Payer: No Typology Code available for payment source | Admitting: Family Medicine

## 2023-03-07 DIAGNOSIS — K219 Gastro-esophageal reflux disease without esophagitis: Secondary | ICD-10-CM | POA: Diagnosis not present

## 2023-03-07 MED ORDER — PANTOPRAZOLE SODIUM 40 MG PO TBEC: 40.0000 mg | DELAYED_RELEASE_TABLET | Freq: Every day | ORAL | 0 refills | Status: DC

## 2023-03-07 NOTE — Progress Notes (Signed)
E-Visit for Heartburn  We are sorry that you are not feeling well.  Here is how we plan to help!  Based on what you shared with me it looks like you most likely have Gastroesophageal Reflux Disease (GERD)  Gastroesophageal reflux disease (GERD) happens when acid from your stomach flows up into the esophagus.  When acid comes in contact with the esophagus, the acid causes sorenss (inflammation) in the esophagus.  Over time, GERD may create small holes (ulcers) in the lining of the esophagus.  I have sent a one time refill for 30 days on protonix.   Your symptoms should improve in the next day or two.  You can use antacids as needed until symptoms resolve.  Call us if your heartburn worsens, you have trouble swallowing, weight loss, spitting up blood or recurrent vomiting.  Home Care: May include lifestyle changes such as weight loss, quitting smoking and alcohol consumption Avoid foods and drinks that make your symptoms worse, such as: Caffeine or alcoholic drinks Chocolate Peppermint or mint flavorings Garlic and onions Spicy foods Citrus fruits, such as oranges, lemons, or limes Tomato-based foods such as sauce, chili, salsa and pizza Fried and fatty foods Avoid lying down for 3 hours prior to your bedtime or prior to taking a nap Eat small, frequent meals instead of a large meals Wear loose-fitting clothing.  Do not wear anything tight around your waist that causes pressure on your stomach. Raise the head of your bed 6 to 8 inches with wood blocks to help you sleep.  Extra pillows will not help.  Seek Help Right Away If: You have pain in your arms, neck, jaw, teeth or back Your pain increases or changes in intensity or duration You develop nausea, vomiting or sweating (diaphoresis) You develop shortness of breath or you faint Your vomit is green, yellow, black or looks like coffee grounds or blood Your stool is red, bloody or black  These symptoms could be signs of other  problems, such as heart disease, gastric bleeding or esophageal bleeding.  Make sure you : Understand these instructions. Will watch your condition. Will get help right away if you are not doing well or get worse.  Your e-visit answers were reviewed by a board certified advanced clinical practitioner to complete your personal care plan.  Depending on the condition, your plan could have included both over the counter or prescription medications.  If there is a problem please reply  once you have received a response from your provider.  Your safety is important to [place].  If you have drug allergies check your prescription carefully.    You can use MyChart to ask questions about today's visit, request a non-urgent call back, or ask for a work or school excuse for 24 hours related to this e-Visit. If it has been greater than 24 hours you will need to follow up with your provider, or enter a new e-Visit to address those concerns.  You will get an e-mail in the next two days asking about your experience.  I hope that your e-visit has been valuable and will speed your recovery. Thank you for using e-visits.   have provided 5 minutes of non face to face time during this encounter for chart review and documentation.

## 2023-03-08 ENCOUNTER — Other Ambulatory Visit: Payer: Self-pay

## 2023-03-08 DIAGNOSIS — K219 Gastro-esophageal reflux disease without esophagitis: Secondary | ICD-10-CM

## 2023-03-08 MED ORDER — PANTOPRAZOLE SODIUM 20 MG PO TBEC
20.0000 mg | DELAYED_RELEASE_TABLET | Freq: Every day | ORAL | 0 refills | Status: DC
Start: 2023-03-08 — End: 2023-04-05

## 2023-03-29 ENCOUNTER — Ambulatory Visit: Payer: No Typology Code available for payment source | Admitting: Physician Assistant

## 2023-03-29 VITALS — BP 115/76 | HR 83 | Ht 70.0 in | Wt 250.0 lb

## 2023-03-29 DIAGNOSIS — R310 Gross hematuria: Secondary | ICD-10-CM

## 2023-03-29 NOTE — Progress Notes (Unsigned)
03/29/2023 3:58 PM   Derek Ramirez 09/30/1976 [id number]  CC: Chief Complaint  Patient presents with   Hematuria   HPI: Derek Ramirez is a 47 y.o. 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   Today he reports intermittent gross hematuria 2 weeks ago that is since resolved associated with some right flank discomfort.  The urine varied in color from red to brown, though he denies fecaluria or pneumaturia.  No fever, chills, nausea, or vomiting.  CT stone study dated 11/22/2021 was notable for a punctate left lower pole stone.  No other stone history.  In-office UA today positive for trace intact blood and trace protein; urine microscopy pan negative.  PMH: Past Medical History:  Diagnosis Date   Asthma    WELL CONTROLLED   Complication of anesthesia    HARD TO WAKE UP AFTER COLONOSCOPY   Diverticulitis    GERD (gastroesophageal reflux disease)    History of chicken pox    History of IBS    History of measles    Panic disorder    Sleep apnea    USES CPAP    Surgical History: Past Surgical History:  Procedure Laterality Date   BACK SURGERY     LOWER   COLON RESECTION SIGMOID  08/14/2020   Procedure: SIGMOID COLECTOMY;  Surgeon: Duanne Guess, MD;  Location: [facility];  Service: General;;   CYSTOSCOPY WITH BIOPSY  08/14/2020   Procedure: CYSTOSCOPY WITH BIOPSY;  Surgeon: Sondra Come, MD;  Location: [facility];  Service: Urology;;   CYSTOSCOPY WITH STENT PLACEMENT Bilateral 08/14/2020   Procedure: CYSTOSCOPY WITH STENT PLACEMENT;  Surgeon: Sondra Come, MD;  Location: [facility];  Service: Urology;  Laterality: Bilateral;   LAPAROTOMY N/A 08/14/2020   Procedure: EXPLORATORY LAPAROTOMY WITH MOBILIZATION OF SPLENIC FLEXURE;  Surgeon: Duanne Guess, MD;  Location: [facility];  Service: General;  Laterality: N/A;   WISDOM TOOTH EXTRACTION      Home  Medications:  Current Meds  Medication Sig   clomiPHENE (CLOMID) 50 MG tablet Take 0.5 tablets (25 mg total) by mouth daily.   pantoprazole (PROTONIX) 20 MG tablet Take 1 tablet (20 mg total) by mouth daily.   sertraline (ZOLOFT) 50 MG tablet TAKE 1 TABLET BY MOUTH DAILY      Allergies:  Allergies  Allergen Reactions   Penicillins Anaphylaxis    Has patient had a PCN reaction causing immediate rash, facial/tongue/throat swelling, SOB or lightheadedness with hypotension: Yes Has patient had a PCN reaction causing severe rash involving mucus membranes or skin necrosis: No Has patient had a PCN reaction that required hospitalization  Patient not sure Has patient had a PCN reaction occurring within the last 10 years: No If all of the above answers are "NO", then may proceed with Cephalosporin use.   Shellfish Allergy Anaphylaxis    Family History: Family History  Problem Relation Age of Onset   Diabetes Maternal Grandmother    Diabetes Maternal Grandfather    Lung cancer Paternal Grandmother        Due to Asbestos   Lung cancer Paternal Grandfather        due to Asbestos   Heart Problems Paternal Grandfather     Social History:   reports that he quit smoking about 12 years ago. His smoking use included cigarettes. He has a 32.00 pack-year smoking history. He has been exposed to tobacco smoke. He has never  used smokeless tobacco. He reports that he does not drink alcohol and does not use drugs.  Physical Exam: BP 115/76   Pulse 83   Ht 5\' 10"  (1.778 m)   Wt 250 lb (113.4 kg)   BMI 35.87 kg/m   Constitutional:  Alert and oriented, no acute distress, nontoxic appearing HEENT: [place], AT Cardiovascular: No clubbing, cyanosis, or edema Respiratory: Normal respiratory effort, no increased work of breathing Skin: No rashes, bruises or suspicious lesions Neurologic: Grossly intact, no focal deficits, moving all 4 extremities Psychiatric: Normal mood and affect  Laboratory  Data: Results for orders placed or performed in visit on 03/29/23  Microscopic Examination   Urine  Result Value Ref Range   WBC, UA 0-5 0 - 5 /hpf   RBC, Urine 0-2 0 - 2 /hpf   Epithelial Cells (non renal) 0-10 0 - 10 /hpf   Mucus, UA Present (A) Not Estab.   Bacteria, UA Few None seen/Few  Urinalysis, Complete  Result Value Ref Range   Specific Gravity, UA 1.025 1.005 - 1.030   pH, UA 6.0 5.0 - 7.5   Color, UA Yellow Yellow   Appearance Ur Clear Clear   Leukocytes,UA Negative Negative   Protein,UA Trace (A) Negative/Trace   Glucose, UA Negative Negative   Ketones, UA Negative Negative   RBC, UA Trace (A) Negative   Bilirubin, UA Negative Negative   Urobilinogen, Ur 0.2 0.2 - 1.0 mg/dL   Nitrite, UA Negative Negative   Microscopic Examination See below:    Assessment & Plan:   1. Gross hematuria Low suspicion for recurrent colovesical fistula given bland UA today and no reported fecaluria or pneumaturia.  With his right flank discomfort, I offered him a CT stone study for evaluation of a possible acute stone episode.  If this is negative, we will pursue cystoscopy.  He is in agreement with this plan. - Urinalysis, Complete - CT RENAL STONE STUDY; Future   Return for Will call with results.  Carman Ching, PA-C  [facility] [place] [address], [address] [place], [place] [address] [phone]

## 2023-03-30 LAB — MICROSCOPIC EXAMINATION

## 2023-03-30 LAB — URINALYSIS, COMPLETE
Bilirubin, UA: NEGATIVE
Glucose, UA: NEGATIVE
Ketones, UA: NEGATIVE
Leukocytes,UA: NEGATIVE
Nitrite, UA: NEGATIVE
Specific Gravity, UA: 1.025 (ref 1.005–1.030)
Urobilinogen, Ur: 0.2 mg/dL (ref 0.2–1.0)
pH, UA: 6 (ref 5.0–7.5)

## 2023-04-05 ENCOUNTER — Other Ambulatory Visit: Payer: Self-pay | Admitting: Physician Assistant

## 2023-04-05 DIAGNOSIS — K219 Gastro-esophageal reflux disease without esophagitis: Secondary | ICD-10-CM

## 2023-04-05 NOTE — Telephone Encounter (Signed)
Requested medications are due for refill today.  yes  Requested medications are on the active medications list.  yes  Last refill. 03/08/2023 #30 0 rf  Future visit scheduled.   no  Notes to clinic.  Courtesy refill already given.    Requested Prescriptions  Pending Prescriptions Disp Refills   pantoprazole (PROTONIX) 20 MG tablet [Pharmacy Med Name: PANTOPRAZOLE SOD DR 20 MG TAB] 30 tablet 0    Sig: TAKE 1 TABLET BY MOUTH DAILY     Gastroenterology: Proton Pump Inhibitors Failed - 04/05/2023  6:22 AM      Failed - Valid encounter within last 12 months    Recent Outpatient Visits           1 year ago Annual physical exam   [name] [facility] [place], [place], PA-C   1 year ago Generalized anxiety disorder   [name] Alfredia Ferguson, PA-C   3 years ago Annual physical exam   [name] Osvaldo Angst M, [place]   5 years ago Gastroesophageal reflux disease without esophagitis   [name] [facility] Malva Limes, [place]   6 years ago Chronic chest wall pain   [place] [facility] [place], [place]       Future Appointments             In 9 months Richardo Hanks, Laurette Schimke, MD [facility] [place]

## 2023-04-12 ENCOUNTER — Ambulatory Visit
Admission: RE | Admit: 2023-04-12 | Discharge: 2023-04-12 | Disposition: A | Discharge status: Home / Self Care | Payer: No Typology Code available for payment source | Source: Ambulatory Visit | Attending: Physician Assistant | Admitting: Physician Assistant

## 2023-04-12 DIAGNOSIS — R310 Gross hematuria: Secondary | ICD-10-CM | POA: Diagnosis present

## 2023-04-21 ENCOUNTER — Other Ambulatory Visit: Payer: No Typology Code available for payment source | Admitting: Urology

## 2023-07-01 ENCOUNTER — Other Ambulatory Visit: Payer: Self-pay | Admitting: Physician Assistant

## 2023-07-01 DIAGNOSIS — K219 Gastro-esophageal reflux disease without esophagitis: Secondary | ICD-10-CM

## 2023-07-02 ENCOUNTER — Other Ambulatory Visit: Payer: Self-pay | Admitting: Physician Assistant

## 2023-07-02 DIAGNOSIS — K219 Gastro-esophageal reflux disease without esophagitis: Secondary | ICD-10-CM

## 2023-07-09 ENCOUNTER — Encounter: Payer: Self-pay | Admitting: Family Medicine

## 2023-07-09 ENCOUNTER — Ambulatory Visit: Payer: No Typology Code available for payment source | Admitting: Family Medicine

## 2023-07-09 DIAGNOSIS — K219 Gastro-esophageal reflux disease without esophagitis: Secondary | ICD-10-CM

## 2023-07-09 DIAGNOSIS — F411 Generalized anxiety disorder: Secondary | ICD-10-CM

## 2023-07-09 MED ORDER — SERTRALINE HCL 50 MG PO TABS
50.0000 mg | ORAL_TABLET | Freq: Every day | ORAL | 1 refills | Status: DC
Start: 2023-07-09 — End: 2024-06-28

## 2023-07-09 MED ORDER — PANTOPRAZOLE SODIUM 40 MG PO TBEC
40.0000 mg | DELAYED_RELEASE_TABLET | Freq: Every day | ORAL | 2 refills | Status: DC
Start: 2023-07-09 — End: 2024-01-03

## 2023-07-09 NOTE — Progress Notes (Unsigned)
Established patient visit   Patient: Derek Ramirez   DOB: 1975-12-26   47 y.o. Male  MRN: [id number] Visit Date: 07/09/2023  Today's healthcare provider: Ronnald Ramp, MD   Chief Complaint  Patient presents with   Medical Management of Chronic Issues   Subjective       Discussed the use of AI scribe software for clinical note transcription with the patient, who gave verbal consent to proceed.  History of Present Illness   The patient, with a history of anxiety, gastroesophageal reflux disease (GERD), and a recent colon surgery, presents for a general check-up and medication review. He is currently on Zoloft 50mg  for anxiety, which he reports is effective and without side effects. The anxiety began after the premature birth of his daughter in 37, which was a stressful time for the patient. He has not had any panic attacks since starting the Zoloft.  The patient also takes Protonix 20mg  daily for GERD. He reports that the medication is effective, but he noticed a significant improvement when he was accidentally given a 40mg  dose. He requests to increase the dose to 40mg  daily.  The patient also mentions a recent colon surgery, which was delayed due to the COVID-19 pandemic. He reports that he is doing well post-surgery and is not experiencing any eating difficulties.         Past Medical History:  Diagnosis Date   Asthma    WELL CONTROLLED   Complication of anesthesia    HARD TO WAKE UP AFTER COLONOSCOPY   Diverticulitis    GERD (gastroesophageal reflux disease)    History of chicken pox    History of IBS    History of measles    Panic disorder    Sleep apnea    USES CPAP    Medications: Outpatient Medications Prior to Visit  Medication Sig   clomiPHENE (CLOMID) 50 MG tablet Take 0.5 tablets (25 mg total) by mouth daily.   [DISCONTINUED] pantoprazole (PROTONIX) 20 MG tablet Take 1 tablet (20 mg total) by mouth daily. Please schedule office visit  before any future refill.   [DISCONTINUED] sertraline (ZOLOFT) 50 MG tablet TAKE 1 TABLET BY MOUTH DAILY   No facility-administered medications prior to visit.    Review of Systems      Objective    BP 103/70 (BP Location: Left Arm, Patient Position: Sitting, Cuff Size: Large)   Pulse 87   Ht 5' 10.5" (1.791 m)   Wt 254 lb 6.4 oz (115.4 kg)   SpO2 97%   BMI 35.99 kg/m     Physical Exam Vitals reviewed.  Constitutional:      General: He is not in acute distress.    Appearance: Normal appearance. He is normal weight. He is not ill-appearing, toxic-appearing or diaphoretic.     Comments: Well groomed, calmly sitting in exam rooom  Eyes:     Conjunctiva/sclera: Conjunctivae normal.  Cardiovascular:     Rate and Rhythm: Normal rate and regular rhythm.     Pulses: Normal pulses.     Heart sounds: Normal heart sounds. No murmur heard.    No friction rub. No gallop.  Pulmonary:     Effort: Pulmonary effort is normal. No respiratory distress.     Breath sounds: Normal breath sounds. No stridor. No wheezing, rhonchi or rales.  Abdominal:     General: Bowel sounds are normal. There is no distension.     Palpations: Abdomen is soft.     Tenderness:  There is no abdominal tenderness.  Musculoskeletal:     Right lower leg: No edema.     Left lower leg: No edema.  Skin:    Findings: No erythema or rash.  Neurological:     Mental Status: He is alert and oriented to person, place, and time.  Psychiatric:        Attention and Perception: Attention and perception normal. He is attentive. He does not perceive auditory or visual hallucinations.        Mood and Affect: Mood and affect normal.        Speech: Speech normal.        Behavior: Behavior normal. Behavior is cooperative.        Thought Content: Thought content normal. Thought content is not paranoid or delusional. Thought content does not include homicidal or suicidal ideation. Thought content does not include homicidal or  suicidal plan.        Judgment: Judgment normal.       No results found for any visits on 07/09/23.  Assessment & Plan     Problem List Items Addressed This Visit     Generalized anxiety disorder    Stable on Zoloft 50mg  daily Chronic problem.  No new side effects reported. History of panic attacks related to stressful life events, none recently -Continue Zoloft 50mg  daily.      Relevant Medications   sertraline (ZOLOFT) 50 MG tablet   GERD (gastroesophageal reflux disease)    Chronic problem  Controlled on Protonix 20mg  daily, but reports better symptom control on 40mg  daily. -Increase Protonix to 40mg  daily.      Relevant Medications   pantoprazole (PROTONIX) 40 MG tablet      General Health Maintenance Due for annual physical examination. -Schedule physical examination in 3 months.  Follow-up Plan to review labs including A1c and cholesterol at the time of the physical examination.         Return in about 3 months (around 10/08/2023) for CPE.         Ronnald Ramp, MD  [facility] [phone] (phone) [phone] (fax)  [facility]

## 2023-07-10 NOTE — Assessment & Plan Note (Signed)
Chronic problem  Controlled on Protonix 20mg  daily, but reports better symptom control on 40mg  daily. -Increase Protonix to 40mg  daily.

## 2023-07-10 NOTE — Assessment & Plan Note (Signed)
Stable on Zoloft 50mg  daily Chronic problem.  No new side effects reported. History of panic attacks related to stressful life events, none recently -Continue Zoloft 50mg  daily.

## 2023-08-10 ENCOUNTER — Encounter: Payer: Self-pay | Admitting: Urology

## 2023-08-10 ENCOUNTER — Ambulatory Visit (INDEPENDENT_AMBULATORY_CARE_PROVIDER_SITE_OTHER): Payer: No Typology Code available for payment source | Admitting: Urology

## 2023-08-10 VITALS — BP 142/90 | HR 85 | Ht 70.0 in | Wt 254.0 lb

## 2023-08-10 DIAGNOSIS — R310 Gross hematuria: Secondary | ICD-10-CM

## 2023-08-10 MED ORDER — SULFAMETHOXAZOLE-TRIMETHOPRIM 800-160 MG PO TABS
1.0000 | ORAL_TABLET | Freq: Once | ORAL | Status: AC
Start: 2023-08-10 — End: 2023-08-10
  Administered 2023-08-10: 1 via ORAL

## 2023-08-10 NOTE — Progress Notes (Signed)
Cystoscopy Procedure Note:  Indication: Gross hematuria, history of colovesical fistula  After informed consent and discussion of the procedure and its risks, Derek Ramirez was positioned and prepped in the standard fashion. Cystoscopy was performed with a flexible cystoscope. The urethra, bladder neck and entire bladder was visualized in a standard fashion. The prostate was small. The ureteral orifices were visualized in their normal location and orientation.  Bladder mucosa grossly normal throughout, no abnormalities on retroflexion.  Cytology sent  Imaging: CT stone protocol dated 04/12/2023 benign, nonobstructive 3 mm left lower pole stone  Findings: Normal cystoscopy  Assessment and Plan: Normal cystoscopy, follow-up cytology results  Legrand Rams, MD 08/10/2023

## 2023-08-10 NOTE — Addendum Note (Signed)
Addended by: Frankey Shown on: 08/10/2023 01:50 PM   Modules accepted: Orders

## 2023-10-08 NOTE — Progress Notes (Unsigned)
Complete physical exam   Patient: Derek Ramirez   DOB: September 16, 1976   47 y.o. Male  MRN: [id number] Visit Date: 10/11/2023  Today's healthcare provider: Ronnald Ramp, MD   No chief complaint on file.  Subjective    Derek Ramirez is a 47 y.o. male who presents today for a complete physical exam.   He reports consuming a {diet types:17450} diet.   {Exercise:19826}   He generally feels {well/fairly well/poorly:18703}.   He reports sleeping {well/fairly well/poorly:18703}.    He {does/does not:200015} have additional problems to discuss today.   Discussed the use of AI scribe software for clinical note transcription with the patient, who gave verbal consent to proceed.  History of Present Illness             Past Medical History:  Diagnosis Date   Asthma    WELL CONTROLLED   Complication of anesthesia    HARD TO WAKE UP AFTER COLONOSCOPY   Diverticulitis    GERD (gastroesophageal reflux disease)    History of chicken pox    History of IBS    History of measles    Panic disorder    Sleep apnea    USES CPAP   Past Surgical History:  Procedure Laterality Date   BACK SURGERY     LOWER   COLON RESECTION SIGMOID  08/14/2020   Procedure: SIGMOID COLECTOMY;  Surgeon: Duanne Guess, MD;  Location: [facility];  Service: General;;   CYSTOSCOPY WITH BIOPSY  08/14/2020   Procedure: CYSTOSCOPY WITH BIOPSY;  Surgeon: Sondra Come, MD;  Location: [facility];  Service: Urology;;   CYSTOSCOPY WITH STENT PLACEMENT Bilateral 08/14/2020   Procedure: CYSTOSCOPY WITH STENT PLACEMENT;  Surgeon: Sondra Come, MD;  Location: [facility];  Service: Urology;  Laterality: Bilateral;   LAPAROTOMY N/A 08/14/2020   Procedure: EXPLORATORY LAPAROTOMY WITH MOBILIZATION OF SPLENIC FLEXURE;  Surgeon: Duanne Guess, MD;  Location: [facility];  Service: General;  Laterality: N/A;   WISDOM TOOTH EXTRACTION     Social History   Socioeconomic History   Marital status: Married     Spouse name: Not on file   Number of children: Not on file   Years of education: Not on file   Highest education level: Not on file  Occupational History   Not on file  Tobacco Use   Smoking status: Former    Current packs/day: 0.00    Average packs/day: 2.0 packs/day for 16.0 years (32.0 ttl pk-yrs)    Types: Cigarettes    Start date: 10/19/1994    Quit date: 10/19/2010    Years since quitting: 12.9    Passive exposure: Past   Smokeless tobacco: Never  Vaping Use   Vaping status: Never Used  Substance and Sexual Activity   Alcohol use: No    Alcohol/week: 0.0 standard drinks of alcohol   Drug use: No   Sexual activity: Yes  Other Topics Concern   Not on file  Social History Narrative   Not on file   Social Drivers of Health   Financial Resource Strain: Not on file  Food Insecurity: Not on file  Transportation Needs: Not on file  Physical Activity: Not on file  Stress: Not on file  Social Connections: Not on file  Intimate Partner Violence: Not on file   Family Status  Relation Name Status   Mother  Alive   Father  Alive   MGM  Deceased       MVA   MGF  Deceased       cause of death: MVA   PGM  Deceased   PGF  Deceased  No partnership data on file   Family History  Problem Relation Age of Onset   Diabetes Maternal Grandmother    Diabetes Maternal Grandfather    Lung cancer Paternal Grandmother        Due to Asbestos   Lung cancer Paternal Grandfather        due to Asbestos   Heart Problems Paternal Grandfather    Allergies  Allergen Reactions   Penicillins Anaphylaxis    Has patient had a PCN reaction causing immediate rash, facial/tongue/throat swelling, SOB or lightheadedness with hypotension: Yes Has patient had a PCN reaction causing severe rash involving mucus membranes or skin necrosis: No Has patient had a PCN reaction that required hospitalization {Yes/No:30480221} Patient not sure Has patient had a PCN reaction occurring within the last 10 years:  No If all of the above answers are "NO", then may proceed with Cephalosporin use.   Shellfish Allergy Anaphylaxis     Medications: Outpatient Medications Prior to Visit  Medication Sig   clomiPHENE (CLOMID) 50 MG tablet Take 0.5 tablets (25 mg total) by mouth daily.   pantoprazole (PROTONIX) 40 MG tablet Take 1 tablet (40 mg total) by mouth daily.   sertraline (ZOLOFT) 50 MG tablet Take 1 tablet (50 mg total) by mouth daily.   No facility-administered medications prior to visit.    Review of Systems  {Insert previous labs (optional):23779} {See past labs  Heme  Chem  Endocrine  Serology  Results Review (optional):1}  Objective    There were no vitals taken for this visit. {Insert last BP/Wt (optional):23777}{See vitals history (optional):1}    Physical Exam  ***  Last depression screening scores    07/09/2023    2:25 PM 03/17/2022    2:49 PM 03/27/2020    9:08 AM  PHQ 2/9 Scores  PHQ - 2 Score 0 0 0  PHQ- 9 Score  0     Last fall risk screening    03/17/2022    2:50 PM  Fall Risk   Falls in the past year? 0  Number falls in past yr: 0  Injury with Fall? 0  Risk for fall due to : No Fall Risks  Follow up Falls evaluation completed    Last Audit-C alcohol use screening    03/17/2022    2:50 PM  Alcohol Use Disorder Test (AUDIT)  1. How often do you have a drink containing alcohol? 0  2. How many drinks containing alcohol do you have on a typical day when you are drinking? 0  3. How often do you have six or more drinks on one occasion? 0  AUDIT-C Score 0   A score of 3 or more in women, and 4 or more in men indicates increased risk for alcohol abuse, EXCEPT if all of the points are from question 1   No results found for any visits on 10/11/23.  Assessment & Plan    Routine Health Maintenance and Physical Exam  Immunization History  Administered Date(s) Administered   Tdap 03/27/2020    Health Maintenance  Topic Date Due   Pneumococcal Vaccine  31-53 Years old (1 of 2 - PCV) Never done   COVID-19 Vaccine (1 - 2024-25 season) Never done   Colonoscopy  09/04/2023   INFLUENZA VACCINE  01/17/2024 (Originally 05/20/2023)   DTaP/Tdap/Td (2 - Td or Tdap) 03/27/2030   Hepatitis  C Screening  Completed   HIV Screening  Completed   HPV VACCINES  Aged Out    Problem List Items Addressed This Visit   None   Assessment and Plan                No follow-ups on file.       Ronnald Ramp, MD  [facility] [phone] (phone) [phone] (fax)  [facility]

## 2023-10-08 NOTE — Patient Instructions (Incomplete)
It was a pleasure to see you today!  Thank you for choosing [facility] for your primary care.   Today you were seen for your annual physical  Please review the attached information regarding helpful preventive health topics.   To keep you healthy, please keep in mind the following health maintenance items that you are due for:   1.Please schedule your colonoscopy for colon cancer screening  2. Consider COVID and Pneumococcal vaccines to protect against respiratory infections   Please make sure to schedule your next annual physical for one year from today.   Please schedule to see me in 1 month for a blood pressure recheck as your blood pressure was elevated today   Best Wishes,   Dr. Roxan Hockey

## 2023-10-11 ENCOUNTER — Ambulatory Visit (INDEPENDENT_AMBULATORY_CARE_PROVIDER_SITE_OTHER): Payer: No Typology Code available for payment source | Admitting: Family Medicine

## 2023-10-11 ENCOUNTER — Encounter: Payer: Self-pay | Admitting: Family Medicine

## 2023-10-11 VITALS — BP 131/93 | HR 89 | Temp 98.7°F | Ht 70.0 in | Wt 252.0 lb

## 2023-10-11 DIAGNOSIS — F410 Panic disorder [episodic paroxysmal anxiety] without agoraphobia: Secondary | ICD-10-CM

## 2023-10-11 DIAGNOSIS — E669 Obesity, unspecified: Secondary | ICD-10-CM

## 2023-10-11 DIAGNOSIS — K219 Gastro-esophageal reflux disease without esophagitis: Secondary | ICD-10-CM

## 2023-10-11 DIAGNOSIS — Z0000 Encounter for general adult medical examination without abnormal findings: Secondary | ICD-10-CM

## 2023-10-11 DIAGNOSIS — Z131 Encounter for screening for diabetes mellitus: Secondary | ICD-10-CM

## 2023-10-11 DIAGNOSIS — J45909 Unspecified asthma, uncomplicated: Secondary | ICD-10-CM

## 2023-10-11 DIAGNOSIS — G4733 Obstructive sleep apnea (adult) (pediatric): Secondary | ICD-10-CM

## 2023-10-11 DIAGNOSIS — Z9049 Acquired absence of other specified parts of digestive tract: Secondary | ICD-10-CM

## 2023-10-11 DIAGNOSIS — Z1211 Encounter for screening for malignant neoplasm of colon: Secondary | ICD-10-CM

## 2023-10-11 DIAGNOSIS — Z720 Tobacco use: Secondary | ICD-10-CM | POA: Diagnosis not present

## 2023-10-11 DIAGNOSIS — Z13220 Encounter for screening for lipoid disorders: Secondary | ICD-10-CM

## 2023-10-11 NOTE — Assessment & Plan Note (Signed)
Chronic conditions are stable  Patient was counseled on benefits of regular physical activity with goal of 150 minutes of moderate to vigurous intensity 4 days per week  Patient was counseled to consume well balanced diet of fruits, vegetables, limited saturated fats and limited sugary foods and beverages with emphasis on consuming 6-8 glasses of water daily  Screening recommended today: A1c, lipids,CMP,CBC, TSH, T4 and T3   Colon cancer screening: GI referral submitted today    Vaccines recommended today: none, UTD with recommended vaccines

## 2023-10-12 LAB — LIPID PANEL
Chol/HDL Ratio: 6.5 {ratio} — ABNORMAL HIGH (ref 0.0–5.0)
Cholesterol, Total: 220 mg/dL — ABNORMAL HIGH (ref 100–199)
HDL: 34 mg/dL — ABNORMAL LOW (ref >39)
LDL Chol Calc (NIH): 164 mg/dL — ABNORMAL HIGH (ref 0–99)
Triglycerides: 122 mg/dL (ref 0–149)
VLDL Cholesterol Cal: 22 mg/dL (ref 5–40)

## 2023-10-12 LAB — CMP14+EGFR
ALT: 29 [IU]/L (ref 0–44)
AST: 24 [IU]/L (ref 0–40)
Albumin: 4.3 g/dL (ref 4.1–5.1)
Alkaline Phosphatase: 117 [IU]/L (ref 44–121)
BUN/Creatinine Ratio: 9 (ref 9–20)
BUN: 13 mg/dL (ref 6–24)
Bilirubin Total: 0.4 mg/dL (ref 0.0–1.2)
CO2: 22 mmol/L (ref 20–29)
Calcium: 9.5 mg/dL (ref 8.7–10.2)
Chloride: 104 mmol/L (ref 96–106)
Creatinine, Ser: 1.39 mg/dL — ABNORMAL HIGH (ref 0.76–1.27)
Globulin, Total: 2.5 g/dL (ref 1.5–4.5)
Glucose: 98 mg/dL (ref 70–99)
Potassium: 4.4 mmol/L (ref 3.5–5.2)
Sodium: 141 mmol/L (ref 134–144)
Total Protein: 6.8 g/dL (ref 6.0–8.5)
eGFR: 64 mL/min/{1.73_m2} (ref >59)

## 2023-10-12 LAB — CBC
Hematocrit: 49.9 % (ref 37.5–51.0)
Hemoglobin: 16.2 g/dL (ref 13.0–17.7)
MCH: 28.3 pg (ref 26.6–33.0)
MCHC: 32.5 g/dL (ref 31.5–35.7)
MCV: 87 fL (ref 79–97)
Platelets: 226 10*3/uL (ref 150–450)
RBC: 5.72 x10E6/uL (ref 4.14–5.80)
RDW: 13.1 % (ref 11.6–15.4)
WBC: 6 10*3/uL (ref 3.4–10.8)

## 2023-10-12 LAB — HEMOGLOBIN A1C
Est. average glucose Bld gHb Est-mCnc: 117 mg/dL
Hgb A1c MFr Bld: 5.7 % — ABNORMAL HIGH (ref 4.8–5.6)

## 2023-10-12 LAB — TSH+T4F+T3FREE
Free T4: 1.07 ng/dL (ref 0.82–1.77)
T3, Free: 2.7 pg/mL (ref 2.0–4.4)
TSH: 3.12 u[IU]/mL (ref 0.450–4.500)

## 2023-10-27 ENCOUNTER — Encounter: Payer: Self-pay | Admitting: *Deleted

## 2023-11-12 ENCOUNTER — Ambulatory Visit: Payer: No Typology Code available for payment source | Admitting: Family Medicine

## 2023-12-15 ENCOUNTER — Emergency Department
Admission: EM | Admit: 2023-12-15 | Discharge: 2023-12-15 | Disposition: A | Discharge status: Home / Self Care | Payer: PRIVATE HEALTH INSURANCE | Attending: Emergency Medicine | Admitting: Emergency Medicine

## 2023-12-15 ENCOUNTER — Emergency Department: Payer: PRIVATE HEALTH INSURANCE

## 2023-12-15 ENCOUNTER — Other Ambulatory Visit: Payer: Self-pay

## 2023-12-15 DIAGNOSIS — N23 Unspecified renal colic: Secondary | ICD-10-CM | POA: Insufficient documentation

## 2023-12-15 DIAGNOSIS — R1032 Left lower quadrant pain: Secondary | ICD-10-CM | POA: Diagnosis present

## 2023-12-15 LAB — URINALYSIS, ROUTINE W REFLEX MICROSCOPIC
Bacteria, UA: NONE SEEN
Bilirubin Urine: NEGATIVE
Glucose, UA: NEGATIVE mg/dL
Ketones, ur: NEGATIVE mg/dL
Leukocytes,Ua: NEGATIVE
Nitrite: NEGATIVE
Protein, ur: 100 mg/dL — AB
RBC / HPF: >50 RBC/hpf (ref 0–5)
Specific Gravity, Urine: 1.026 (ref 1.005–1.030)
Squamous Epithelial / HPF: 0 /[HPF] (ref 0–5)
pH: 5 (ref 5.0–8.0)

## 2023-12-15 LAB — COMPREHENSIVE METABOLIC PANEL
ALT: 33 U/L (ref 0–44)
AST: 28 U/L (ref 15–41)
Albumin: 4.5 g/dL (ref 3.5–5.0)
Alkaline Phosphatase: 77 U/L (ref 38–126)
Anion gap: 11 (ref 5–15)
BUN: 21 mg/dL — ABNORMAL HIGH (ref 6–20)
CO2: 20 mmol/L — ABNORMAL LOW (ref 22–32)
Calcium: 9.6 mg/dL (ref 8.9–10.3)
Chloride: 107 mmol/L (ref 98–111)
Creatinine, Ser: 1.28 mg/dL — ABNORMAL HIGH (ref 0.61–1.24)
GFR, Estimated: >60 mL/min (ref >60)
Glucose, Bld: 124 mg/dL — ABNORMAL HIGH (ref 70–99)
Potassium: 3.7 mmol/L (ref 3.5–5.1)
Sodium: 138 mmol/L (ref 135–145)
Total Bilirubin: 1.1 mg/dL (ref 0.0–1.2)
Total Protein: 7.1 g/dL (ref 6.5–8.1)

## 2023-12-15 LAB — CBC
HCT: 42.4 % (ref 39.0–52.0)
Hemoglobin: 14.8 g/dL (ref 13.0–17.0)
MCH: 29.2 pg (ref 26.0–34.0)
MCHC: 34.9 g/dL (ref 30.0–36.0)
MCV: 83.6 fL (ref 80.0–100.0)
Platelets: 256 10*3/uL (ref 150–400)
RBC: 5.07 MIL/uL (ref 4.22–5.81)
RDW: 12.8 % (ref 11.5–15.5)
WBC: 8.8 10*3/uL (ref 4.0–10.5)
nRBC: 0 % (ref 0.0–0.2)

## 2023-12-15 LAB — LIPASE, BLOOD: Lipase: 41 U/L (ref 11–51)

## 2023-12-15 MED ORDER — TAMSULOSIN HCL 0.4 MG PO CAPS: 0.4000 mg | ORAL_CAPSULE | Freq: Every day | ORAL | 0 refills | Status: DC

## 2023-12-15 MED ORDER — ONDANSETRON 4 MG PO TBDP
4.0000 mg | ORAL_TABLET | Freq: Once | ORAL | Status: AC
  Administered 2023-12-15: 4 mg via ORAL
  Filled 2023-12-15: qty 1

## 2023-12-15 MED ORDER — TAMSULOSIN HCL 0.4 MG PO CAPS
0.4000 mg | ORAL_CAPSULE | Freq: Once | ORAL | Status: AC
  Administered 2023-12-15: 0.4 mg via ORAL
  Filled 2023-12-15: qty 1

## 2023-12-15 MED ORDER — KETOROLAC TROMETHAMINE 30 MG/ML IJ SOLN
15.0000 mg | Freq: Once | INTRAMUSCULAR | Status: AC
  Administered 2023-12-15: 15 mg via INTRAVENOUS
  Filled 2023-12-15: qty 1

## 2023-12-15 MED ORDER — LACTATED RINGERS IV BOLUS
1000.0000 mL | Freq: Once | INTRAVENOUS | Status: AC
  Administered 2023-12-15: 1000 mL via INTRAVENOUS

## 2023-12-15 MED ORDER — OXYCODONE HCL 5 MG PO TABS: 5.0000 mg | ORAL_TABLET | Freq: Three times a day (TID) | ORAL | 0 refills | Status: DC | PRN

## 2023-12-15 MED ORDER — OXYCODONE-ACETAMINOPHEN 5-325 MG PO TABS
1.0000 | ORAL_TABLET | ORAL | Status: AC | PRN
  Administered 2023-12-15 (×2): 1 via ORAL
  Filled 2023-12-15 (×2): qty 1

## 2023-12-15 NOTE — ED Notes (Signed)
 Patient discharged from ED by provider. Discharge instructions reviewed with patient and all questions answered. Patient ambulatory from ED in NAD.

## 2023-12-15 NOTE — ED Provider Notes (Signed)
 [facility] Provider Note    Event Date/Time   First MD Initiated Contact with Patient 12/15/23 2012     (approximate)   History   Flank Pain and Abdominal Pain   HPI  Derek Ramirez is a 48 y.o. male who presents to the ED for evaluation of Flank Pain and Abdominal Pain   Review of urology outpatient cystoscopy from October for gross hematuria.  Normal cystoscopy.  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  He has had 1 previous kidney stone.  No procedures for kidney stones.   Physical Exam   Triage Vital Signs: ED Triage Vitals  Encounter Vitals Group     BP 12/15/23 1905 131/84     Systolic BP Percentile --      Diastolic BP Percentile --      Pulse Rate 12/15/23 1905 86     Resp 12/15/23 1905 20     Temp 12/15/23 1905 98.7 F (37.1 C)     Temp Source 12/15/23 1905 Oral     SpO2 12/15/23 1905 98 %     Weight 12/15/23 1903 250 lb (113.4 kg)     Height 12/15/23 1903 5\' 11"  (1.803 m)     Head Circumference --      Peak Flow --      Pain Score 12/15/23 1903 8     Pain Loc --      Pain Education --      Exclude from Growth Chart --     Most recent vital signs: Vitals:   12/15/23 2100 12/15/23 2130  BP:    Pulse: 83 89  Resp:    Temp:    SpO2: 100% 99%    General: Awake, no distress.  Seems uncomfortable CV:  Good peripheral perfusion.  Resp:  Normal effort.  Abd:  No distention.  MSK:  No deformity noted.  Neuro:  No focal deficits appreciated. Other:     ED Results / Procedures / Treatments   Labs (all labs ordered are listed, but only abnormal results are displayed) Labs Reviewed  COMPREHENSIVE METABOLIC PANEL - Abnormal; Notable for the following components:      Result Value   CO2 20 (*)    Glucose, Bld 124 (*)    BUN 21 (*)    Creatinine, Ser 1.28 (*)    All other components within normal limits  URINALYSIS, ROUTINE  W REFLEX MICROSCOPIC - Abnormal; Notable for the following components:   Color, Urine YELLOW (*)    APPearance CLOUDY (*)    Hgb urine dipstick LARGE (*)    Protein, ur 100 (*)    All other components within normal limits  LIPASE, BLOOD  CBC    EKG   RADIOLOGY CT renal study interpreted by me with a moderate size stone to mid left ureter with some proximal obstruction  Official radiology report(s): CT Renal Stone Study Result Date: 12/15/2023 CLINICAL DATA:  Abdominal and flank pain with stone suspected. Left flank pain and left lower quadrant pain starting 45 minutes ago. EXAM: CT ABDOMEN AND PELVIS WITHOUT CONTRAST TECHNIQUE: Multidetector CT imaging of the abdomen and pelvis was performed following the standard protocol without IV contrast. RADIATION DOSE REDUCTION: This exam was performed according to the departmental dose-optimization program which includes automated exposure control, adjustment of the mA and/or kV according to patient size and/or use of iterative reconstruction technique.  COMPARISON:  04/12/2023 FINDINGS: Lower chest: Lung bases are clear. Hepatobiliary: Mild diffuse fatty infiltration of the liver. Gallbladder and bile ducts are normal. Pancreas: Unremarkable. No pancreatic ductal dilatation or surrounding inflammatory changes. Spleen: Normal in size without focal abnormality. Adrenals/Urinary Tract: No adrenal gland nodules. Left renal hydronephrosis and hydroureter to the level of L5 where there is a 4 mm stone. Mild periureteral stranding. No additional stones identified. Bladder is decompressed. Stomach/Bowel: Stomach, small bowel, and colon are not abnormally distended. No wall thickening or inflammatory changes. Surgical anastomosis in the sigmoid region. Appendix is normal. Vascular/Lymphatic: No significant vascular findings are present. No enlarged abdominal or pelvic lymph nodes. Reproductive: Prostate is unremarkable. Other: Scarring in the anterior abdominal  wall, likely postoperative. Several small ventral abdominal wall hernias along the midline containing fat. Musculoskeletal: No acute or significant osseous findings. IMPRESSION: 1. 4 mm stone in the mid/distal left ureter with moderate proximal obstruction. 2. Fatty infiltration of the liver. 3. Small midline ventral abdominal wall hernias containing fat. No evidence of bowel obstruction. Electronically Signed   By: Burman Nieves M.D.   On: 12/15/2023 20:09    PROCEDURES and INTERVENTIONS:  Procedures  Medications  ondansetron (ZOFRAN-ODT) disintegrating tablet 4 mg (4 mg Oral Given 12/15/23 1914)  oxyCODONE-acetaminophen (PERCOCET/ROXICET) 5-325 MG per tablet 1 tablet (1 tablet Oral Given 12/15/23 2238)  lactated ringers bolus 1,000 mL (0 mLs Intravenous Stopped 12/15/23 2145)  tamsulosin (FLOMAX) capsule 0.4 mg (0.4 mg Oral Given 12/15/23 2058)  ketorolac (TORADOL) 30 MG/ML injection 15 mg (15 mg Intravenous Given 12/15/23 2059)     IMPRESSION / MDM / ASSESSMENT AND PLAN / ED COURSE  I reviewed the triage vital signs and the nursing notes.  Differential diagnosis includes, but is not limited to, diverticulitis with or without abscess, ureteral stone, UTI, pyelonephritis  {Patient presents with symptoms of an acute illness or injury that is potentially life-threatening.  Patient presents with ureteral colic.  Urine without infectious features.  No leukocytosis or signs of sepsis.  CT with a moderate stone on the left.  Will provide Flomax, fluids and analgesia and reassess.  Suspect he will be suitable for outpatient management.  Clinical Course as of 12/15/23 2240  Wed Dec 15, 2023  2236 Reassessed.  [DS]  2240 Reassessed.  Feeling much better, some mild residual soreness but he is comfortable going home.  We discussed expectant management, ED return precautions and urology follow-up. [DS]    Clinical Course User Index [DS] Delton Prairie, MD     FINAL CLINICAL IMPRESSION(S) / ED  DIAGNOSES   Final diagnoses:  Ureteral colic     Rx / DC Orders   ED Discharge Orders          Ordered    oxyCODONE (ROXICODONE) 5 MG immediate release tablet  Every 8 hours PRN        12/15/23 2238    tamsulosin (FLOMAX) 0.4 MG CAPS capsule  Daily        12/15/23 2238             Note:  This document was prepared using Dragon voice recognition software and may include unintentional dictation errors.   Delton Prairie, MD 12/15/23 [phone]

## 2023-12-15 NOTE — ED Triage Notes (Signed)
 Pt to ED via POV c/o left flank pain and LLQ pain that started about 45 mins ago. Denies any N/V/D. denies urinary symptoms

## 2023-12-15 NOTE — Discharge Instructions (Addendum)
 Flomax once daily for the next few weeks  Oxycodone as needed for more severe/breakthrough pain  Use Tylenol for pain and fevers.  Up to 1000 mg per dose, up to 4 times per day.  Do not take more than 4000 mg of Tylenol/acetaminophen within 24 hours.Marland Kitchen

## 2024-01-01 ENCOUNTER — Other Ambulatory Visit: Payer: Self-pay | Admitting: Family Medicine

## 2024-01-01 DIAGNOSIS — K219 Gastro-esophageal reflux disease without esophagitis: Secondary | ICD-10-CM

## 2024-01-03 NOTE — Telephone Encounter (Signed)
 Requested Prescriptions  Pending Prescriptions Disp Refills   pantoprazole (PROTONIX) 40 MG tablet [Pharmacy Med Name: PANTOPRAZOLE SOD DR 40 MG TAB] 90 tablet 0    Sig: TAKE 1 TABLET BY MOUTH DAILY     Gastroenterology: Proton Pump Inhibitors Passed - 01/03/2024 12:47 PM      Passed - Valid encounter within last 12 months    Recent Outpatient Visits           2 months ago Annual physical exam   [facility] Simmons[place]Robinson, [place], [place]   5 months ago Gastroesophageal reflux disease without esophagitis   [facility], [place], [place]   1 year ago Annual physical exam   [facility] [employer], [place], [place]   2 years ago Generalized anxiety disorder   [facility] Alfredia Ferguson, PA-C   3 years ago Annual physical exam   [facility] [place], [place], [place]       Future Appointments             In 1 week Richardo Hanks, Laurette Schimke, MD [facility] [place]

## 2024-01-11 ENCOUNTER — Ambulatory Visit: Payer: Self-pay | Admitting: Urology

## 2024-02-10 ENCOUNTER — Encounter: Payer: Self-pay | Admitting: *Deleted

## 2024-02-12 ENCOUNTER — Emergency Department
Admission: EM | Admit: 2024-02-12 | Discharge: 2024-02-12 | Disposition: A | Discharge status: Home / Self Care | Payer: PRIVATE HEALTH INSURANCE | Attending: Emergency Medicine | Admitting: Emergency Medicine

## 2024-02-12 ENCOUNTER — Emergency Department: Payer: PRIVATE HEALTH INSURANCE

## 2024-02-12 ENCOUNTER — Other Ambulatory Visit: Payer: Self-pay

## 2024-02-12 DIAGNOSIS — R109 Unspecified abdominal pain: Secondary | ICD-10-CM

## 2024-02-12 DIAGNOSIS — N23 Unspecified renal colic: Secondary | ICD-10-CM

## 2024-02-12 DIAGNOSIS — R1032 Left lower quadrant pain: Secondary | ICD-10-CM | POA: Diagnosis present

## 2024-02-12 DIAGNOSIS — N132 Hydronephrosis with renal and ureteral calculous obstruction: Secondary | ICD-10-CM | POA: Diagnosis not present

## 2024-02-12 LAB — CBC
HCT: 43 % (ref 39.0–52.0)
Hemoglobin: 15.1 g/dL (ref 13.0–17.0)
MCH: 29.6 pg (ref 26.0–34.0)
MCHC: 35.1 g/dL (ref 30.0–36.0)
MCV: 84.3 fL (ref 80.0–100.0)
Platelets: 279 10*3/uL (ref 150–400)
RBC: 5.1 MIL/uL (ref 4.22–5.81)
RDW: 12.9 % (ref 11.5–15.5)
WBC: 9 10*3/uL (ref 4.0–10.5)
nRBC: 0 % (ref 0.0–0.2)

## 2024-02-12 LAB — BASIC METABOLIC PANEL WITH GFR
Anion gap: 10 (ref 5–15)
BUN: 19 mg/dL (ref 6–20)
CO2: 21 mmol/L — ABNORMAL LOW (ref 22–32)
Calcium: 9.5 mg/dL (ref 8.9–10.3)
Chloride: 107 mmol/L (ref 98–111)
Creatinine, Ser: 1.53 mg/dL — ABNORMAL HIGH (ref 0.61–1.24)
GFR, Estimated: 57 mL/min — ABNORMAL LOW (ref >60)
Glucose, Bld: 118 mg/dL — ABNORMAL HIGH (ref 70–99)
Potassium: 3.5 mmol/L (ref 3.5–5.1)
Sodium: 138 mmol/L (ref 135–145)

## 2024-02-12 LAB — URINALYSIS, ROUTINE W REFLEX MICROSCOPIC
Bacteria, UA: NONE SEEN
Bilirubin Urine: NEGATIVE
Glucose, UA: NEGATIVE mg/dL
Ketones, ur: NEGATIVE mg/dL
Leukocytes,Ua: NEGATIVE
Nitrite: NEGATIVE
Protein, ur: 100 mg/dL — AB
RBC / HPF: >50 RBC/hpf (ref 0–5)
Specific Gravity, Urine: 1.034 — ABNORMAL HIGH (ref 1.005–1.030)
Squamous Epithelial / HPF: 0 /HPF (ref 0–5)
pH: 5 (ref 5.0–8.0)

## 2024-02-12 MED ORDER — ONDANSETRON 4 MG PO TBDP: 4.0000 mg | ORAL_TABLET | Freq: Three times a day (TID) | ORAL | 0 refills | Status: AC | PRN

## 2024-02-12 MED ORDER — KETOROLAC TROMETHAMINE 30 MG/ML IJ SOLN
30.0000 mg | Freq: Once | INTRAMUSCULAR | Status: AC
  Administered 2024-02-12: 30 mg via INTRAVENOUS
  Filled 2024-02-12: qty 1

## 2024-02-12 MED ORDER — TAMSULOSIN HCL 0.4 MG PO CAPS
0.4000 mg | ORAL_CAPSULE | Freq: Once | ORAL | Status: AC
  Administered 2024-02-12: 0.4 mg via ORAL
  Filled 2024-02-12: qty 1

## 2024-02-12 MED ORDER — SODIUM CHLORIDE 0.9 % IV BOLUS
1000.0000 mL | Freq: Once | INTRAVENOUS | Status: AC
  Administered 2024-02-12: 1000 mL via INTRAVENOUS

## 2024-02-12 MED ORDER — MORPHINE SULFATE (PF) 4 MG/ML IV SOLN
4.0000 mg | Freq: Once | INTRAVENOUS | Status: AC
  Administered 2024-02-12: 4 mg via INTRAVENOUS
  Filled 2024-02-12: qty 1

## 2024-02-12 MED ORDER — OXYCODONE HCL 5 MG PO TABS: 5.0000 mg | ORAL_TABLET | Freq: Three times a day (TID) | ORAL | 0 refills | Status: AC | PRN

## 2024-02-12 MED ORDER — ONDANSETRON HCL 4 MG/2ML IJ SOLN
4.0000 mg | Freq: Once | INTRAMUSCULAR | Status: AC
  Administered 2024-02-12: 4 mg via INTRAVENOUS
  Filled 2024-02-12: qty 2

## 2024-02-12 MED ORDER — OXYCODONE-ACETAMINOPHEN 5-325 MG PO TABS
1.0000 | ORAL_TABLET | Freq: Once | ORAL | Status: AC
  Administered 2024-02-12: 1 via ORAL
  Filled 2024-02-12: qty 1

## 2024-02-12 MED ORDER — TAMSULOSIN HCL 0.4 MG PO CAPS: 0.4000 mg | ORAL_CAPSULE | Freq: Every day | ORAL | 1 refills | Status: DC

## 2024-02-12 MED ORDER — LIDOCAINE 5 % EX PTCH
1.0000 | MEDICATED_PATCH | CUTANEOUS | Status: DC
  Administered 2024-02-12: 1 via TRANSDERMAL
  Filled 2024-02-12: qty 1

## 2024-02-12 NOTE — ED Triage Notes (Signed)
 Pt to ed from home via POV for flank pain on the left side. Pt has HX of multiple kidney stones in the past and advised this feels the same. Last stone was in February. Pt had some oxycodone  at home and it has not helped. Pt is caox4, unable to sit sstill in triage.

## 2024-02-12 NOTE — Discharge Instructions (Addendum)
 Please call urology Dr. Estanislao Heimlich office for further evaluation.  In the interim drink plenty of fluids to stay hydrated and hopefully flush out the stone.  Please review attached patient education for more information.  Take medication as prescribed.

## 2024-02-12 NOTE — ED Provider Notes (Signed)
 [facility] Emergency Department Provider Note     Event Date/Time   First MD Initiated Contact with Patient 02/12/24 1749     (approximate)   History   Flank Pain   HPI  Derek Ramirez is a 48 y.o. male presents to the ED for evaluation of sudden left flank pain with radiation to left groin with onset earlier today with associated nausea.  Patient reports history of kidney stones and today symptoms are similar.  Denies fever and chills.  Chart reviewed -patient seen in ED in February for similar symptoms, 4mm stone in the mid/distal left ureter with moderate proximal obstruction noted.   Physical Exam   Triage Vital Signs: ED Triage Vitals [02/12/24 1700]  Encounter Vitals Group     BP (!) 149/90     Systolic BP Percentile      Diastolic BP Percentile      Pulse Rate 80     Resp 16     Temp 98 F (36.7 C)     Temp Source Oral     SpO2 100 %     Weight      Height      Head Circumference      Peak Flow      Pain Score 10     Pain Loc      Pain Education      Exclude from Growth Chart     Most recent vital signs: Vitals:   02/12/24 1953 02/12/24 2037  BP: (!) 139/96 133/87  Pulse: 88 78  Resp: 16 16  Temp:    SpO2: 98% 97%    General Awake, no distress.  HEENT NCAT.  CV:  Good peripheral perfusion.  RESP:  Normal effort.  ABD:  No distention.  Soft, nontender.  Positive left CVA tenderness.   ED Results / Procedures / Treatments   Labs (all labs ordered are listed, but only abnormal results are displayed) Labs Reviewed  URINALYSIS, ROUTINE W REFLEX MICROSCOPIC - Abnormal; Notable for the following components:      Result Value   Color, Urine YELLOW (*)    APPearance TURBID (*)    Specific Gravity, Urine 1.034 (*)    Hgb urine dipstick LARGE (*)    Protein, ur 100 (*)    All other components within normal limits  BASIC METABOLIC PANEL WITH GFR - Abnormal; Notable for the following components:   CO2 21 (*)    Glucose,  Bld 118 (*)    Creatinine, Ser 1.53 (*)    GFR, Estimated 57 (*)    All other components within normal limits  CBC   RADIOLOGY  I personally viewed and evaluated these images as part of my medical decision making, as well as reviewing the written report by the radiologist.  ED Provider Interpretation: Left UVJ calculus noted  CT Renal Stone Study Result Date: 02/12/2024 CLINICAL DATA:  Abdominal and flank pain EXAM: CT ABDOMEN AND PELVIS WITHOUT CONTRAST TECHNIQUE: Multidetector CT imaging of the abdomen and pelvis was performed following the standard protocol without IV contrast. RADIATION DOSE REDUCTION: This exam was performed according to the departmental dose-optimization program which includes automated exposure control, adjustment of the mA and/or kV according to patient size and/or use of iterative reconstruction technique. COMPARISON:  12/15/2023 FINDINGS: Lower chest: No acute pleural or parenchymal lung disease. Hepatobiliary: Hepatic steatosis. Otherwise unremarkable unenhanced appearance of the liver and gallbladder. Pancreas: Unremarkable unenhanced appearance. Spleen: Unremarkable unenhanced appearance. Adrenals/Urinary Tract: There  is an obstructing 7 mm left UVJ calculus, reference image 83/2, with mild to moderate left-sided hydronephrosis and hydroureter. Mild left renal edema and perinephric fat stranding. The right kidney is unremarkable. The adrenals are grossly normal. The bladder is decompressed, limiting its evaluation. Stomach/Bowel: No bowel obstruction or ileus. Normal appendix right lower quadrant. Postsurgical changes from prior sigmoid colon resection and reanastomosis. No bowel wall thickening or inflammatory change. Vascular/Lymphatic: No significant vascular findings are present. No enlarged abdominal or pelvic lymph nodes. Reproductive: Prostate is unremarkable. Other: No free fluid or free intraperitoneal gas. Small fat containing supraumbilical ventral hernias. No  bowel herniation. Musculoskeletal: No acute or destructive bony abnormalities. Reconstructed images demonstrate no additional findings. IMPRESSION: 1. Obstructing 7 mm left UVJ calculus, with mild to moderate left-sided obstructive uropathy. 2. Hepatic steatosis. Electronically Signed   By: Bobbye Burrow M.D.   On: 02/12/2024 19:08    PROCEDURES:  Critical Care performed: No  Procedures   MEDICATIONS ORDERED IN ED: Medications  lidocaine  (LIDODERM ) 5 % 1 patch (1 patch Transdermal Patch Applied 02/12/24 2038)  ketorolac  (TORADOL ) 30 MG/ML injection 30 mg (30 mg Intravenous Given 02/12/24 1713)  sodium chloride  0.9 % bolus 1,000 mL (0 mLs Intravenous Stopped 02/12/24 1949)  oxyCODONE -acetaminophen  (PERCOCET/ROXICET) 5-325 MG per tablet 1 tablet (1 tablet Oral Given 02/12/24 1841)  ondansetron  (ZOFRAN ) injection 4 mg (4 mg Intravenous Given 02/12/24 1839)  morphine  (PF) 4 MG/ML injection 4 mg (4 mg Intravenous Given 02/12/24 1945)  tamsulosin  (FLOMAX ) capsule 0.4 mg (0.4 mg Oral Given 02/12/24 1953)     IMPRESSION / MDM / ASSESSMENT AND PLAN / ED COURSE  I reviewed the triage vital signs and the nursing notes.                              Clinical Course as of 02/12/24 2056  Sat Feb 12, 2024  1932 Urology paged. [MH]  1939 Consulted Dr. Willye Harvey who reviewed CT scan and recommended outpatient pain control, Flomax  and follow-up in office. [MH]    Clinical Course User Index [MH] Alvaro Augusta A, PA-C    48 y.o. male presents to the emergency department for evaluation and treatment of acute left flank pain. See HPI for further details.   Differential diagnosis includes, but is not limited to kidney stone, UTI, pyelonephritis  Patient's presentation is most consistent with acute complicated illness / injury requiring diagnostic workup.  Patient is alert and oriented.  He is hemodynamically stable and afebrile.  Physical exam findings are stated above.  High suspicion for  nephrolithiasis.  Urinalysis reveals large Hgb.  Creatinine 1.53, GFR 57.  CBC reassuring.  CT renal study obtained in triage.  Results revealing a obstructing 7 mm left UVJ calculus with mild to moderate left-sided obstructive uropathy.  I consulted with urology who recommended patient follow-up in office for further evaluation.  In ED morphine  help with pain along with Zofran  for nausea.  IV saline fluids given.  First dose of Flomax  provided.  Patient is currently in stable condition for discharge home.  He is instructed to follow-up with urology.  ED return precautions discussed.   FINAL CLINICAL IMPRESSION(S) / ED DIAGNOSES   Final diagnoses:  Ureteral colic  Left flank pain    Rx / DC Orders   ED Discharge Orders          Ordered    tamsulosin  (FLOMAX ) 0.4 MG CAPS capsule  Daily  02/12/24 2004    oxyCODONE  (ROXICODONE ) 5 MG immediate release tablet  Every 8 hours PRN        02/12/24 2004    ondansetron  (ZOFRAN -ODT) 4 MG disintegrating tablet  Every 8 hours PRN        02/12/24 2004             Note:  This document was prepared using Dragon voice recognition software and may include unintentional dictation errors.    Phyllis Breeze, Kamber Vignola A, PA-C 02/12/24 2100    Twilla Galea, MD 02/13/24 [phone]

## 2024-02-12 NOTE — ED Notes (Signed)
 Patient c/o lower abdominal pain and lower L back pain rated 9/10. [place] notified.

## 2024-02-15 ENCOUNTER — Ambulatory Visit (INDEPENDENT_AMBULATORY_CARE_PROVIDER_SITE_OTHER): Payer: PRIVATE HEALTH INSURANCE | Admitting: Urology

## 2024-02-15 ENCOUNTER — Telehealth: Payer: Self-pay

## 2024-02-15 ENCOUNTER — Other Ambulatory Visit: Payer: Self-pay

## 2024-02-15 ENCOUNTER — Encounter: Payer: Self-pay | Admitting: Urology

## 2024-02-15 VITALS — BP 122/78 | HR 76 | Ht 70.0 in | Wt 246.0 lb

## 2024-02-15 DIAGNOSIS — N201 Calculus of ureter: Secondary | ICD-10-CM | POA: Diagnosis not present

## 2024-02-15 NOTE — Telephone Encounter (Signed)
 Per Dr. Estanislao Heimlich, Patient is to be scheduled for Left Ureteroscopy with Laser Lithotripsy and Stent Placement   Mr. Brenneman was contacted and possible surgical dates were discussed, Friday May 2nd, 2025 was agreed upon for surgery.  Patient was directed to call [phone] between 1-3pm the day before surgery to find out surgical arrival time.  Instructions were given not to eat or drink from midnight on the night before surgery and have a driver for the day of surgery. On the surgery day patient was instructed to enter through the [facility] entrance of [facility] report the Same Day Surgery desk.   Pre-Admit Testing will be in contact via phone to set up an interview with the anesthesia team to review your history and medications prior to surgery.   Reminder of this information was sent via MyChart to the patient.

## 2024-02-15 NOTE — Progress Notes (Signed)
 Surgical Physician Order Form [facility] [place]  Dr. Jay Meth, MD  * Scheduling expectation : Friday 5/2  *Length of Case: 45 minutes  *Clearance needed: no  *Anticoagulation Instructions: May continue all anticoagulants  *Aspirin Instructions: Ok to continue Aspirin  *Post-op visit Date/Instructions:  tbd  *Diagnosis: Left Ureteral Stone  *Procedure: left Ureteroscopy w/laser lithotripsy & stent placement (23762)   Additional orders: N/A  -Admit type: OUTpatient  -Anesthesia: General  -VTE Prophylaxis Standing Order SCD's       Other:   -Standing Lab Orders Per Anesthesia    Lab other: None  -Standing Test orders EKG/Chest x-ray per Anesthesia       Test other:   - Medications: Cipro  400 mg IV  -Other orders:  N/A

## 2024-02-15 NOTE — Progress Notes (Signed)
   [facility] Surgical Posting Form  Surgery Date: Date: 02/18/2024  Surgeon: Dr. Jay Meth, MD  Inpt ( No  )   Outpt (Yes)   Obs ( No  )   Diagnosis: N20.1 Left Ureteral Stone  -CPT: [id number]  Surgery: Left Ureteroscopy with Laser Lithotripsy and Stent Placement  Stop Anticoagulations: No, may continue all  Cardiac/Medical/Pulmonary Clearance needed: no  *Orders entered into EPIC  Date: 02/15/24   *Case booked in [place]  Date: 02/15/24  *Notified pt of Surgery: Date: 02/15/24  PRE-OP UA & CX: no  *Placed into Prior Authorization Work [place] Date: 02/15/24  Assistant/laser/rep:No

## 2024-02-15 NOTE — Progress Notes (Signed)
   02/15/2024 10:48 AM   Derek Ramirez Mar 30, 1976 [id number]  Reason for visit: Left ureteral stone  HPI: 48 year old male who I have previously followed for history of a colovesical fistula as well as hematuria.  He had a normal cystoscopy in October 2024, CT at that time showed a nonobstructive 3 mm left lower pole stone.  He was seen in the ER on 02/12/2024 with left-sided flank pain, and CT showed a 7 mm left distal ureteral stone with upstream hydronephrosis.  Urinalysis was not infected and he was discharged with close urology follow-up.  I personally viewed and interpreted those images.  Notably, he was also seen in the ER for left-sided flank pain on 12/15/2023, and CT at that time showed a 7 mm left mid ureteral stone.  Based on the images and absence of any renal stones, suspect this was the same stone that has still not passed.  Continues to have left flank pain, taking NSAIDs and oxycodone  as needed.  He denies any fevers or chills.  We discussed various treatment options for urolithiasis including observation with or without medical expulsive therapy, shockwave lithotripsy (SWL), ureteroscopy and laser lithotripsy with stent placement, and percutaneous nephrolithotomy.We discussed that management is based on stone size, location, density, patient co-morbidities, and patient preference. Stones <64mm in size have a >80% spontaneous passage rate. Data surrounding the use of tamsulosin  for medical expulsive therapy is controversial, but meta analyses suggests it is most efficacious for distal stones between 5-33mm in size. Possible side effects include dizziness/lightheadedness, and retrograde ejaculation.SWL has a lower stone free rate in a single procedure, but also a lower complication rate compared to ureteroscopy and avoids a stent and associated stent related symptoms. Possible complications include renal hematoma, steinstrasse, and need for additional treatment.Ureteroscopy with laser  lithotripsy and stent placement has a higher stone free rate than SWL in a single procedure, however increased complication rate including possible infection, ureteral injury, bleeding, and stent related morbidity. Common stent related symptoms include dysuria, urgency/frequency, and flank pain.  Schedule left ureteroscopy, laser lithotripsy, stent placement this Friday  Lawerence Pressman, MD  [facility] [address] [place], [place] [address] [phone]

## 2024-02-15 NOTE — Patient Instructions (Signed)
 Laser Therapy for Kidney Stones(ureteroscopy and laser lithotripsy) Laser therapy for kidney stones is a procedure to break up rock-like masses that form inside the kidneys (kidney stones). It is done using a device that beams a strong light (laser) on the kidney stones. This breaks the stones up into small pieces. These small pieces may leave your body when you pee (urinate) or may be taken out during the procedure.  You may need laser therapy if you have kidney stones that are painful or that are stopping you from being able to pee. Tell a health care provider about: Any allergies you have. All medicines you are taking, including vitamins, herbs, eye drops, creams, and over-the-counter medicines. Any problems you or family members have had with anesthesia. Any bleeding problems you have. Any surgeries you have had. Any medical conditions you have. Whether you are pregnant or may be pregnant. What are the risks? Your health care provider will talk with you about risks. These may include: Infection. Bleeding. Allergic reactions to medicines. Damage to: The part of your body that drains pee (urine) from the bladder (urethra). The bladder. The tube that connects the bladder to the kidneys (ureter). Urinary tract infection (UTI). Urethral stricture. This is when the urethra is narrowed by scarring. Trouble peeing. Blockage of the kidney. This may be caused by a piece of kidney stone. What happens before the procedure? When to stop eating and drinking Follow instructions from your provider about what you may eat and drink. These may include: 8 hours before the procedure Stop eating most foods. Do not eat meat, fried foods, or fatty foods. Eat only light foods, such as toast or crackers. All liquids are okay except energy drinks and alcohol. 6 hours before the procedure Stop eating. Drink only clear liquids, such as water, clear fruit juice, black coffee, plain tea, and sports drinks. Do  not drink energy drinks or alcohol. 2 hours before the procedure Stop drinking all liquids. You may be allowed to take medicines with small sips of water. If you do not follow your provider's instructions, your procedure may be delayed or canceled. Medicines Ask your provider about: Changing or stopping your regular medicines. These include any diabetes medicines or blood thinners you take. Taking medicines such as aspirin and ibuprofen . These medicines can thin your blood. Do not take them unless your provider tells you to. Taking over-the-counter medicines, vitamins, herbs, and supplements. Tests You may have a physical exam before the procedure. You may also have tests done. These may include: Imaging tests. Blood or pee tests. Surgery safety Ask your provider: How your surgery site will be marked. What steps will be taken to help prevent infection. These steps may include: Removing hair at the surgery site. Washing skin with a soap that kills germs. Taking antibiotics. General instructions Do not use any products that contain nicotine or tobacco for at least 4 weeks before the procedure. These products include cigarettes, chewing tobacco, and vaping devices, such as e-cigarettes. If you need help quitting, ask your provider. If you will be going home right after the procedure, plan to have a responsible adult: Take you home from the hospital or clinic. You will not be allowed to drive. Care for you for the time you are told. What happens during the procedure?  An IV will be inserted into one of your veins. You will be given: A sedative. This helps you relax. Anesthesia. This keeps you from feeling pain. It will make you fall asleep for  surgery. A tool with a camera on the end (ureteroscope) will be put into your urethra. It will be moved through your bladder to your kidney. It will send pictures to a screen in the operating room. This will show what parts of your kidney need to be  treated. A tube will be put through the ureteroscope. It will be moved into your kidney. The laser device will be put into your kidney through the tube. The laser will be used to break up the kidney stones. A tool with a tiny wire basket may be put through the tube into your kidney. This can help remove the small pieces of the kidney stone. A small mesh tube (stent) may be placed to allow your kidney to drain. The tube and ureteroscope will be taken out at the end of the surgery. The procedure may vary among providers and hospitals. What happens after the procedure? Your blood pressure, heart rate, breathing rate, and blood oxygen level will be monitored until you leave the hospital or clinic. If you had a stent placed, it may have a string that will be secured to your skin. This helps your provider remove the stent. You may be given a strainer to collect any stone pieces that you pass in your pee. Your provider may have these tested. This information is not intended to replace advice given to you by your health care provider. Make sure you discuss any questions you have with your health care provider. Document Revised: 06/05/2022 Document Reviewed: 06/05/2022 Elsevier Patient Education  2024 [employer].

## 2024-02-17 ENCOUNTER — Encounter
Admission: RE | Admit: 2024-02-17 | Discharge: 2024-02-17 | Disposition: A | Discharge status: Home / Self Care | Payer: PRIVATE HEALTH INSURANCE | Source: Ambulatory Visit | Attending: Urology | Admitting: Urology

## 2024-02-17 ENCOUNTER — Other Ambulatory Visit: Payer: Self-pay

## 2024-02-17 HISTORY — DX: Personal history of urinary calculi: Z87.442

## 2024-02-17 HISTORY — DX: Obstructive sleep apnea (adult) (pediatric): G47.33

## 2024-02-17 HISTORY — DX: Tobacco use: Z72.0

## 2024-02-17 HISTORY — DX: Hematuria, unspecified: R31.9

## 2024-02-17 HISTORY — DX: Vesicointestinal fistula: N32.1

## 2024-02-17 HISTORY — DX: Obesity, unspecified: E66.9

## 2024-02-17 NOTE — Patient Instructions (Addendum)
 Your procedure is scheduled on:02-18-24 Friday Report to the Registration Desk on the 1st floor of the [facility]. Then proceed to the 2nd floor Surgery Desk. Arrive at 12:30 pm  REMEMBER: Instructions that are not followed completely may result in serious medical risk, up to and including death; or upon the discretion of your surgeon and anesthesiologist your surgery may need to be rescheduled.  Do not eat food OR drink any liquids after midnight the night before surgery.  No gum chewing or hard candies.  One week prior to surgery:Stop NOW (02-17-24) Stop Anti-inflammatories (NSAIDS) such as Advil , Aleve, Ibuprofen , Motrin , Naproxen, Naprosyn and Aspirin based products such as Excedrin, Goody's Powder, BC Powder.  You may however, continue to take Tylenol  if needed for pain up until the day of surgery.  Continue taking all of your other prescription medications up until the day of surgery.  ON THE DAY OF SURGERY ONLY TAKE THESE MEDICATIONS WITH SIPS OF WATER: -pantoprazole  (PROTONIX )  -sertraline  (ZOLOFT )  -tamsulosin  (FLOMAX )  -You may take oxyCODONE  (ROXICODONE ) if needed for pain  No Alcohol for 24 hours before or after surgery.  No Smoking including e-cigarettes for 24 hours before surgery.  No chewable tobacco products for at least 6 hours before surgery.  No nicotine patches on the day of surgery.  Do not use any "recreational" drugs for at least a week (preferably 2 weeks) before your surgery.  Please be advised that the combination of cocaine and anesthesia may have negative outcomes, up to and including death. If you test positive for cocaine, your surgery will be cancelled.  On the morning of surgery brush your teeth with toothpaste and water, you may rinse your mouth with mouthwash if you wish. Do not swallow any toothpaste or mouthwash.  Do not wear jewelry, make-up, hairpins, clips or nail polish.  For welded (permanent) jewelry: bracelets, anklets, waist bands, etc.   Please have this removed prior to surgery.  If it is not removed, there is a chance that hospital personnel will need to cut it off on the day of surgery.  Do not wear lotions, powders, or perfumes.   Do not shave body hair from the neck down 48 hours before surgery.  Contact lenses, hearing aids and dentures may not be worn into surgery.  Do not bring valuables to the hospital. [facility] is not responsible for any missing/lost belongings or valuables.   Notify your doctor if there is any change in your medical condition (cold, fever, infection).  Wear comfortable clothing (specific to your surgery type) to the hospital.  After surgery, you can help prevent lung complications by doing breathing exercises.  Take deep breaths and cough every 1-2 hours. Your doctor may order a device called an Incentive Spirometer to help you take deep breaths. When coughing or sneezing, hold a pillow firmly against your incision with both hands. This is called "splinting." Doing this helps protect your incision. It also decreases belly discomfort.  If you are being admitted to the hospital overnight, leave your suitcase in the car. After surgery it may be brought to your room.  In case of increased patient census, it may be necessary for you, the patient, to continue your postoperative care in the Same Day Surgery department.  If you are being discharged the day of surgery, you will not be allowed to drive home. You will need a responsible individual to drive you home and stay with you for 24 hours after surgery.   If you  are taking public transportation, you will need to have a responsible individual with you.  Please call the Pre-admissions Testing Dept. at [phone] if you have any questions about these instructions.  Surgery Visitation Policy:  Patients having surgery or a procedure may have two visitors.  Children under the age of 104 must have an adult with them who is not the patient.

## 2024-02-18 ENCOUNTER — Other Ambulatory Visit: Payer: Self-pay

## 2024-02-18 ENCOUNTER — Ambulatory Visit: Admitting: Anesthesiology

## 2024-02-18 ENCOUNTER — Encounter
Admission: RE | Disposition: A | Discharge status: Home / Self Care | Payer: Self-pay | Source: Home / Self Care | Attending: Urology

## 2024-02-18 ENCOUNTER — Ambulatory Visit
Admission: RE | Admit: 2024-02-18 | Discharge: 2024-02-18 | Disposition: A | Discharge status: Home / Self Care | Attending: Urology | Admitting: Urology

## 2024-02-18 ENCOUNTER — Encounter: Payer: Self-pay | Admitting: Urology

## 2024-02-18 ENCOUNTER — Ambulatory Visit

## 2024-02-18 DIAGNOSIS — N201 Calculus of ureter: Secondary | ICD-10-CM | POA: Diagnosis present

## 2024-02-18 DIAGNOSIS — G4733 Obstructive sleep apnea (adult) (pediatric): Secondary | ICD-10-CM | POA: Insufficient documentation

## 2024-02-18 DIAGNOSIS — Z87891 Personal history of nicotine dependence: Secondary | ICD-10-CM | POA: Diagnosis not present

## 2024-02-18 HISTORY — PX: CYSTOSCOPY/URETEROSCOPY/HOLMIUM LASER/STENT PLACEMENT: SHX6546

## 2024-02-18 SURGERY — CYSTOSCOPY/URETEROSCOPY/HOLMIUM LASER/STENT PLACEMENT
Anesthesia: General | Site: Ureter | Laterality: Left

## 2024-02-18 MED ORDER — ORAL CARE MOUTH RINSE: 15.0000 mL | Freq: Once | OROMUCOSAL | Status: AC

## 2024-02-18 MED ORDER — ROCURONIUM BROMIDE 100 MG/10ML IV SOLN
INTRAVENOUS | Status: DC | PRN
  Administered 2024-02-18: 40 mg via INTRAVENOUS

## 2024-02-18 MED ORDER — FENTANYL CITRATE (PF) 100 MCG/2ML IJ SOLN
INTRAMUSCULAR | Status: AC
  Filled 2024-02-18: qty 2

## 2024-02-18 MED ORDER — IOHEXOL 180 MG/ML  SOLN
INTRAMUSCULAR | Status: DC | PRN
  Administered 2024-02-18: 10 mL

## 2024-02-18 MED ORDER — KETOROLAC TROMETHAMINE 10 MG PO TABS: 10.0000 mg | ORAL_TABLET | Freq: Four times a day (QID) | ORAL | 0 refills | Status: AC | PRN

## 2024-02-18 MED ORDER — DROPERIDOL 2.5 MG/ML IJ SOLN: 0.6250 mg | Freq: Once | INTRAMUSCULAR | Status: DC | PRN

## 2024-02-18 MED ORDER — PROPOFOL 10 MG/ML IV BOLUS
INTRAVENOUS | Status: DC | PRN
  Administered 2024-02-18: 180 mg via INTRAVENOUS
  Administered 2024-02-18: 20 mg via INTRAVENOUS

## 2024-02-18 MED ORDER — ACETAMINOPHEN 500 MG PO TABS
1000.0000 mg | ORAL_TABLET | Freq: Once | ORAL | Status: AC
Start: 2024-02-18 — End: 2024-02-18
  Administered 2024-02-18: 1000 mg via ORAL

## 2024-02-18 MED ORDER — MIDAZOLAM HCL 2 MG/2ML IJ SOLN
INTRAMUSCULAR | Status: AC
  Filled 2024-02-18: qty 2

## 2024-02-18 MED ORDER — OXYCODONE HCL 5 MG PO TABS
ORAL_TABLET | ORAL | Status: AC
  Filled 2024-02-18: qty 1

## 2024-02-18 MED ORDER — SUGAMMADEX SODIUM 200 MG/2ML IV SOLN
INTRAVENOUS | Status: DC | PRN
  Administered 2024-02-18: 220 mg via INTRAVENOUS

## 2024-02-18 MED ORDER — KETOROLAC TROMETHAMINE 30 MG/ML IJ SOLN
INTRAMUSCULAR | Status: DC | PRN
  Administered 2024-02-18: 15 mg via INTRAVENOUS

## 2024-02-18 MED ORDER — FENTANYL CITRATE (PF) 100 MCG/2ML IJ SOLN
25.0000 ug | INTRAMUSCULAR | Status: DC | PRN
Start: 2024-02-18 — End: 2024-02-18

## 2024-02-18 MED ORDER — MIDAZOLAM HCL 2 MG/2ML IJ SOLN
INTRAMUSCULAR | Status: DC | PRN
  Administered 2024-02-18: 2 mg via INTRAVENOUS

## 2024-02-18 MED ORDER — DEXAMETHASONE SODIUM PHOSPHATE 10 MG/ML IJ SOLN
INTRAMUSCULAR | Status: DC | PRN
  Administered 2024-02-18: 5 mg via INTRAVENOUS

## 2024-02-18 MED ORDER — LACTATED RINGERS IV SOLN: INTRAVENOUS | Status: DC

## 2024-02-18 MED ORDER — CHLORHEXIDINE GLUCONATE 0.12 % MT SOLN
15.0000 mL | Freq: Once | OROMUCOSAL | Status: AC
  Administered 2024-02-18: 15 mL via OROMUCOSAL

## 2024-02-18 MED ORDER — FENTANYL CITRATE (PF) 100 MCG/2ML IJ SOLN
INTRAMUSCULAR | Status: DC | PRN
  Administered 2024-02-18: 50 ug via INTRAVENOUS

## 2024-02-18 MED ORDER — ACETAMINOPHEN 500 MG PO TABS
ORAL_TABLET | ORAL | Status: AC
  Filled 2024-02-18: qty 2

## 2024-02-18 MED ORDER — CHLORHEXIDINE GLUCONATE 0.12 % MT SOLN
OROMUCOSAL | Status: AC
  Filled 2024-02-18: qty 15

## 2024-02-18 MED ORDER — OXYCODONE HCL 5 MG/5ML PO SOLN: 5.0000 mg | Freq: Once | ORAL | Status: AC | PRN

## 2024-02-18 MED ORDER — CIPROFLOXACIN IN D5W 400 MG/200ML IV SOLN
400.0000 mg | INTRAVENOUS | Status: AC
  Administered 2024-02-18: 400 mg via INTRAVENOUS

## 2024-02-18 MED ORDER — ONDANSETRON HCL 4 MG/2ML IJ SOLN
INTRAMUSCULAR | Status: DC | PRN
  Administered 2024-02-18: 4 mg via INTRAVENOUS

## 2024-02-18 MED ORDER — LIDOCAINE HCL (CARDIAC) PF 100 MG/5ML IV SOSY
PREFILLED_SYRINGE | INTRAVENOUS | Status: DC | PRN
Start: 2024-02-18 — End: 2024-02-18
  Administered 2024-02-18: 100 mg via INTRAVENOUS

## 2024-02-18 MED ORDER — OXYCODONE HCL 5 MG PO TABS
5.0000 mg | ORAL_TABLET | Freq: Once | ORAL | Status: AC | PRN
  Administered 2024-02-18: 5 mg via ORAL

## 2024-02-18 MED ORDER — SULFAMETHOXAZOLE-TRIMETHOPRIM 800-160 MG PO TABS
1.0000 | ORAL_TABLET | Freq: Once | ORAL | 0 refills | Status: AC | PRN
Start: 2024-02-18 — End: ?

## 2024-02-18 MED ORDER — ACETAMINOPHEN 10 MG/ML IV SOLN: 1000.0000 mg | Freq: Once | INTRAVENOUS | Status: DC | PRN

## 2024-02-18 MED ORDER — CIPROFLOXACIN IN D5W 400 MG/200ML IV SOLN
INTRAVENOUS | Status: AC
  Filled 2024-02-18: qty 200

## 2024-02-18 MED ORDER — SODIUM CHLORIDE 0.9 % IR SOLN
Status: DC | PRN
  Administered 2024-02-18: 3000 mL

## 2024-02-18 SURGICAL SUPPLY — 27 items
ADHESIVE MASTISOL STRL (MISCELLANEOUS) IMPLANT
BAG DRAIN SIEMENS DORNER NS (MISCELLANEOUS) ×1 IMPLANT
BAG PRESSURE INF REUSE 3000 (BAG) ×1 IMPLANT
BRUSH SCRUB EZ 1% IODOPHOR (MISCELLANEOUS) ×1 IMPLANT
BRUSH SCRUB EZ 4% CHG (MISCELLANEOUS) IMPLANT
CATH URET FLEX-TIP 2 LUMEN 10F (CATHETERS) IMPLANT
CATH URETL OPEN 5X70 (CATHETERS) IMPLANT
CNTNR URN SCR LID CUP LEK RST (MISCELLANEOUS) IMPLANT
DRAPE UTILITY 15X26 TOWEL STRL (DRAPES) ×1 IMPLANT
DRSG TEGADERM 2-3/8X2-3/4 SM (GAUZE/BANDAGES/DRESSINGS) IMPLANT
FIBER LASER MOSES 200 DFL (Laser) IMPLANT
FIBER LASER MOSES 365 DFL (Laser) IMPLANT
GLOVE BIOGEL PI IND STRL 7.5 (GLOVE) ×1 IMPLANT
GOWN STRL REUS W/ TWL LRG LVL3 (GOWN DISPOSABLE) ×1 IMPLANT
GOWN STRL REUS W/ TWL XL LVL3 (GOWN DISPOSABLE) ×1 IMPLANT
GUIDEWIRE STR DUAL SENSOR (WIRE) ×1 IMPLANT
KIT TURNOVER CYSTO (KITS) ×1 IMPLANT
PACK CYSTO AR (MISCELLANEOUS) ×1 IMPLANT
SET CYSTO W/LG BORE CLAMP LF (SET/KITS/TRAYS/PACK) ×1 IMPLANT
SHEATH NAVIGATOR HD 12/14X36 (SHEATH) IMPLANT
SOL .9 NS 3000ML IRR UROMATIC (IV SOLUTION) ×1 IMPLANT
STENT URET 6FRX24 CONTOUR (STENTS) IMPLANT
STENT URET 6FRX26 CONTOUR (STENTS) IMPLANT
SURGILUBE 2OZ TUBE FLIPTOP (MISCELLANEOUS) ×1 IMPLANT
SYR 10ML LL (SYRINGE) ×1 IMPLANT
VALVE UROSEAL ADJ ENDO (VALVE) IMPLANT
WATER STERILE IRR 500ML POUR (IV SOLUTION) ×1 IMPLANT

## 2024-02-18 NOTE — Anesthesia Preprocedure Evaluation (Addendum)
 Anesthesia Evaluation  Patient identified by MRN, date of birth, ID band Patient awake    Reviewed: Allergy & Precautions, H&P , NPO status , Patient's Chart, lab work & pertinent test results  History of Anesthesia Complications (+) PROLONGED EMERGENCE and history of anesthetic complications  Airway Mallampati: II  TM Distance: >3 FB Neck ROM: full    Dental no notable dental hx.    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 No date: Complication of anesthesia     Comment:  Delayed Emergence No date: Diverticulitis No date: GERD (gastroesophageal reflux disease) No date: Hematuria No date: History of chicken pox No date: History of IBS No date: History of kidney stones No date: History of measles No date: Obesity No date: OSA on CPAP No date: Panic disorder No date: Tobacco use  Past Surgical History: No date: BACK SURGERY     Comment:  LOWER 08/14/2020: COLON RESECTION SIGMOID     Comment:  Procedure: SIGMOID COLECTOMY;  Surgeon: Mercy Stall, MD;  Location: [facility];  Service: General;; 08/14/2020: CYSTOSCOPY WITH BIOPSY     Comment:  Procedure: CYSTOSCOPY WITH BIOPSY;  Surgeon: Lawerence Pressman, MD;  Location: [facility];  Service: Urology;; 08/14/2020: CYSTOSCOPY WITH STENT PLACEMENT; Bilateral     Comment:  Procedure: CYSTOSCOPY WITH STENT PLACEMENT;  Surgeon:               Lawerence Pressman, MD;   Location: [facility];  Service:               Urology;  Laterality: Bilateral; 08/14/2020: LAPAROTOMY; N/A     Comment:  Procedure: EXPLORATORY LAPAROTOMY WITH MOBILIZATION OF               SPLENIC FLEXURE;  Surgeon: Mercy Stall, MD;                Location: [facility];  Service: General;  Laterality: N/A; No date: WISDOM TOOTH EXTRACTION     Reproductive/Obstetrics negative OB ROS                             Anesthesia Physical Anesthesia Plan  ASA: 2  Anesthesia Plan: General ETT   Post-op Pain Management: Tylenol  PO (pre-op)* and Toradol  IV (intra-op)*   Induction: Intravenous  PONV Risk Score and Plan: 2 and Ondansetron , Dexamethasone  and Midazolam   Airway Management Planned: Oral ETT  Additional Equipment:   Intra-op Plan:   Post-operative Plan: Extubation in OR  Informed Consent: I have reviewed the patients History and Physical, chart, labs and discussed the procedure including the risks, benefits and alternatives for the proposed anesthesia with the patient or authorized representative who has indicated his/her understanding and acceptance.  Dental Advisory Given  Plan Discussed with: CRNA and Surgeon  Anesthesia Plan Comments:         Anesthesia Quick Evaluation

## 2024-02-18 NOTE — Anesthesia Procedure Notes (Signed)
 Procedure Name: Intubation Date/Time: 02/18/2024 1:14 PM  Performed by: Juanda Noon, CRNAPre-anesthesia Checklist: Patient identified, Patient being monitored, Timeout performed, Emergency Drugs available and Suction available Patient Re-evaluated:Patient Re-evaluated prior to induction Oxygen Delivery Method: Circle system utilized Preoxygenation: Pre-oxygenation with 100% oxygen Induction Type: IV induction Ventilation: Mask ventilation without difficulty Laryngoscope Size: Mac, 3 and McGrath Grade View: Grade I Tube type: Oral Tube size: 7.0 mm Number of attempts: 1 Airway Equipment and Method: Stylet Placement Confirmation: ETT inserted through vocal cords under direct vision, positive ETCO2 and breath sounds checked- equal and bilateral Secured at: 23 cm Tube secured with: Tape Dental Injury: Teeth and Oropharynx as per pre-operative assessment

## 2024-02-18 NOTE — Transfer of Care (Signed)
 Immediate Anesthesia Transfer of Care Note  Patient: Derek Ramirez  Procedure(s) Performed: CYSTOSCOPY/URETEROSCOPY/HOLMIUM LASER/STENT PLACEMENT (Left: Ureter)  Patient Location: PACU  Anesthesia Type:General  Level of Consciousness: drowsy  Airway & Oxygen Therapy: Patient Spontanous Breathing and Patient connected to face mask oxygen  Post-op Assessment: Report given to RN and Post -op Vital signs reviewed and stable  Post vital signs: Reviewed and stable  Last Vitals:  Vitals Value Taken Time  BP 109/75 02/18/24 1352  Temp 36.8 C 02/18/24 1352  Pulse 73 02/18/24 1359  Resp 16 02/18/24 1359  SpO2 100 % 02/18/24 1359  Vitals shown include unfiled device data.  Last Pain:  Vitals:   02/18/24 1352  TempSrc:   PainSc: Asleep         Complications: No notable events documented.

## 2024-02-18 NOTE — Op Note (Signed)
 Date of procedure: 02/18/24  Preoperative diagnosis:  Left ureteral stone  Postoperative diagnosis:  Same  Procedure: Cystoscopy, left ureteroscopy, laser lithotripsy, left retrograde pyelogram with intraoperative interpretation, left ureteral stent placement  Surgeon: Jay Meth, MD  Anesthesia: General  Complications: None  Intraoperative findings:  Small prostate, normal bladder, uncomplicated dusting of left distal ureteral stone and stent placement with Dangler  EBL: None  Specimens: None  Drains: Left 6 French by 26 cm ureteral stent  Indication: Derek Ramirez is a 48 y.o. patient with 7 mm left distal ureteral stone who failed a trial of medical expulsive therapy and opted for ureteroscopy.  After reviewing the management options for treatment, they elected to proceed with the above surgical procedure(s). We have discussed the potential benefits and risks of the procedure, side effects of the proposed treatment, the likelihood of the patient achieving the goals of the procedure, and any potential problems that might occur during the procedure or recuperation. Informed consent has been obtained.  Description of procedure:  The patient was taken to the operating room and general anesthesia was induced. SCDs were placed for DVT prophylaxis. The patient was placed in the dorsal lithotomy position, prepped and draped in the usual sterile fashion, and preoperative antibiotics were administered. A preoperative time-out was performed.   A 21 French rigid cystoscope was used to intubate the urethra and a normal-appearing urethra was followed proximally to the bladder.  The prostate was small.  Thorough cystoscopy showed no suspicious lesions, the ureteral orifices were orthotopic bilaterally.  With the aid of an access catheter, a sensor wire was advanced into the left ureteral orifice and advanced up to the left kidney under fluoroscopic vision.  A semirigid long ureteroscope was  advanced alongside the wire and I immediately identified a yellow calcium oxalate appearing stone.  A 200 m laser fiber on settings of 1.5 J and 10 Hz was used to methodically fragment the stone to dust, all fragments were irrigated free from the ureter.  A retrograde pyelogram from the mid ureter showed no extravasation or filling defects.  The rigid cystoscope was backloaded over the wire, and a 6 [place] by 26 cm ureteral stent was placed uneventfully with a curl in the upper pole as well as under direct vision in the bladder.  Fluid drained through the side ports of the stent.  The bladder was drained, Dangler secured to the dorsal aspect of the penis with Mastisol and Tegaderm, and this concluded procedure.  Disposition: Stable to PACU  Plan: Remove stent at home on Tuesday morning Follow-up with urology as needed  Jay Meth, MD

## 2024-02-18 NOTE — H&P (Signed)
   02/18/24 12:53 PM   Delman Derek Ramirez Cast 1976-03-14 [id number]  CC: Left ureteral stone  HPI: 48 year old male with left ureteral stone for at least 2 months and ongoing intermittent renal colic, opted for ureteroscopy today.  Denies any UTI symptoms or fevers or chills.   PMH: Past Medical History:  Diagnosis Date   Asthma    no inhalers   Colovesical fistula    Complication of anesthesia    Delayed Emergence   Diverticulitis    GERD (gastroesophageal reflux disease)    Hematuria    History of chicken pox    History of IBS    History of kidney stones    History of measles    Obesity    OSA on CPAP    Panic disorder    Tobacco use     Surgical History: Past Surgical History:  Procedure Laterality Date   BACK SURGERY     LOWER   COLON RESECTION SIGMOID  08/14/2020   Procedure: SIGMOID COLECTOMY;  Surgeon: Mercy Stall, MD;  Location: [facility];  Service: General;;   CYSTOSCOPY WITH BIOPSY  08/14/2020   Procedure: CYSTOSCOPY WITH BIOPSY;  Surgeon: Lawerence Pressman, MD;  Location: [facility];  Service: Urology;;   CYSTOSCOPY WITH STENT PLACEMENT Bilateral 08/14/2020   Procedure: CYSTOSCOPY WITH STENT PLACEMENT;  Surgeon: Lawerence Pressman, MD;  Location: [facility];  Service: Urology;  Laterality: Bilateral;   LAPAROTOMY N/A 08/14/2020   Procedure: EXPLORATORY LAPAROTOMY WITH MOBILIZATION OF SPLENIC FLEXURE;  Surgeon: Mercy Stall, MD;  Location: [facility];  Service: General;  Laterality: N/A;   WISDOM TOOTH EXTRACTION       Family History: Family History  Problem Relation Age of Onset   Diabetes Maternal Grandmother    Diabetes Maternal Grandfather    Lung cancer Paternal Grandmother        Due to Asbestos   Lung cancer Paternal Grandfather        due to Asbestos   Heart Problems Paternal Grandfather     Social History:  reports that he quit smoking about 13 years ago. His smoking use included cigarettes. He started smoking about 29 years ago. He has a 32  pack-year smoking history. He has been exposed to tobacco smoke. He has never used smokeless tobacco. He reports that he does not drink alcohol and does not use drugs.  Physical Exam: BP 126/86   Pulse 90   Temp 98.1 F (36.7 C) (Temporal)   Resp 16   Ht 5\' 10"  (1.778 m)   Wt 111.6 kg   SpO2 98%   BMI 35.30 kg/m    Constitutional:  Alert and oriented, No acute distress. Cardiovascular:RRR Respiratory: CTA bilaterally GI: Abdomen is soft, nontender, nondistended, no abdominal masses   Assessment & Plan:   48 year old male who failed trial of passage for a 7 mm left distal ureteral stone and opted for definitive treatment with ureteroscopy.  We specifically discussed the risks ureteroscopy including bleeding, infection/sepsis, stent related symptoms including flank pain/urgency/frequency/incontinence/dysuria, ureteral injury, ureteral stricture, inability to access stone, or need for staged or additional procedures.  Left ureteroscopy, laser lithotripsy, stent placement  Jay Meth, MD 02/18/2024  [facility] [address] [place], [place] [address] [phone]

## 2024-02-21 ENCOUNTER — Encounter: Payer: Self-pay | Admitting: Urology

## 2024-02-21 NOTE — Anesthesia Postprocedure Evaluation (Signed)
 Anesthesia Post Note  Patient: Derek Ramirez  Procedure(s) Performed: CYSTOSCOPY/URETEROSCOPY/HOLMIUM LASER/STENT PLACEMENT (Left: Ureter)  Patient location during evaluation: PACU Anesthesia Type: General Level of consciousness: awake and alert Pain management: pain level controlled Vital Signs Assessment: post-procedure vital signs reviewed and stable Respiratory status: spontaneous breathing, nonlabored ventilation and respiratory function stable Cardiovascular status: blood pressure returned to baseline and stable Postop Assessment: no apparent nausea or vomiting Anesthetic complications: no   No notable events documented.   Last Vitals:  Vitals:   02/18/24 1430 02/18/24 1440  BP: 117/70 120/79  Pulse: 80 84  Resp: 17 18  Temp: 36.5 C 36.7 C  SpO2: 98% 98%    Last Pain:  Vitals:   02/18/24 1440  TempSrc: Temporal  PainSc: 0-No pain                 Baltazar Bonier

## 2024-06-28 ENCOUNTER — Other Ambulatory Visit: Payer: Self-pay | Admitting: Family Medicine

## 2024-06-28 DIAGNOSIS — K219 Gastro-esophageal reflux disease without esophagitis: Secondary | ICD-10-CM

## 2024-06-28 DIAGNOSIS — F411 Generalized anxiety disorder: Secondary | ICD-10-CM

## 2024-09-28 ENCOUNTER — Other Ambulatory Visit: Payer: Self-pay | Admitting: Family Medicine

## 2024-09-28 DIAGNOSIS — K219 Gastro-esophageal reflux disease without esophagitis: Secondary | ICD-10-CM

## 2024-09-28 DIAGNOSIS — F411 Generalized anxiety disorder: Secondary | ICD-10-CM
